# Patient Record
Sex: Female | Born: 1970 | Race: White | Hispanic: No | Smoking: Never smoker
Health system: Southern US, Community
[De-identification: ages and names within clinical notes are randomized; demographics above are authoritative.]

## PROBLEM LIST (undated history)

## (undated) DIAGNOSIS — I10 Essential (primary) hypertension: Secondary | ICD-10-CM

## (undated) DIAGNOSIS — I509 Heart failure, unspecified: Secondary | ICD-10-CM

## (undated) DIAGNOSIS — E119 Type 2 diabetes mellitus without complications: Secondary | ICD-10-CM

## (undated) DIAGNOSIS — F32A Depression, unspecified: Secondary | ICD-10-CM

## (undated) DIAGNOSIS — R600 Localized edema: Secondary | ICD-10-CM

## (undated) HISTORY — DX: Essential (primary) hypertension: I10

## (undated) HISTORY — DX: Depression, unspecified: F32.A

## (undated) HISTORY — DX: Localized edema: R60.0

---

## 2005-11-18 ENCOUNTER — Ambulatory Visit: Payer: Self-pay | Admitting: Family Medicine

## 2007-04-17 ENCOUNTER — Emergency Department: Payer: Self-pay | Admitting: Emergency Medicine

## 2008-06-19 ENCOUNTER — Emergency Department: Payer: Self-pay | Admitting: Emergency Medicine

## 2009-05-13 ENCOUNTER — Ambulatory Visit: Payer: Self-pay

## 2010-05-18 ENCOUNTER — Ambulatory Visit: Payer: Self-pay

## 2011-03-10 IMAGING — US ULTRASOUND RIGHT BREAST
1 series · 17 of 17 positions shown · non-contrast
Comparison: none

REASON FOR EXAM: 2  1 cm nodules at [DATE] right breast approximately 7 cm
from the areola
COMMENTS:

[Series 1: ultrasound right breast · 17 of 17 slices shown]
[im 1/17]
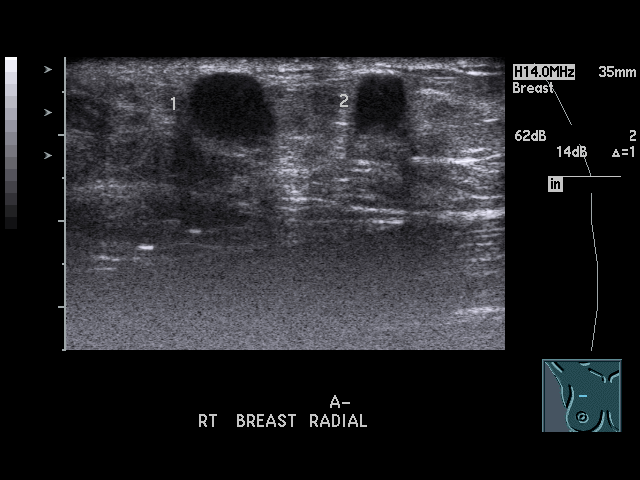
[im 2/17]
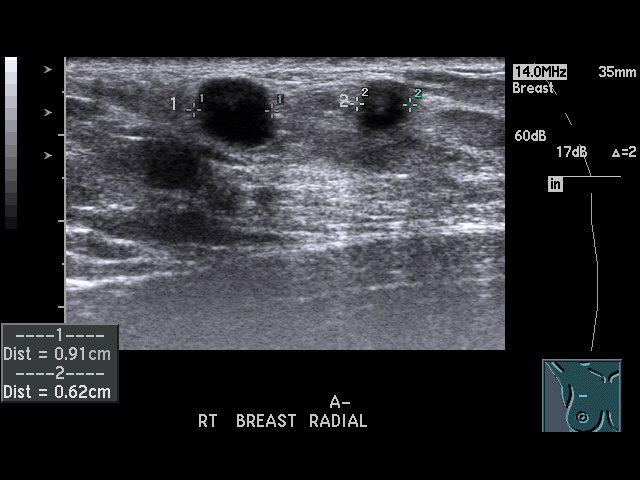
[im 3/17]
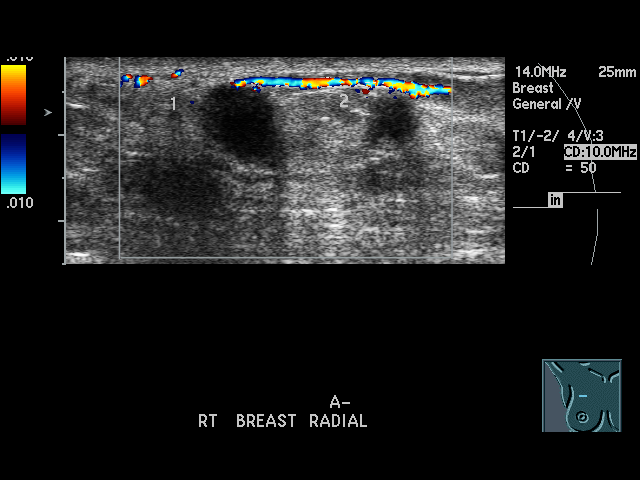
[im 4/17]
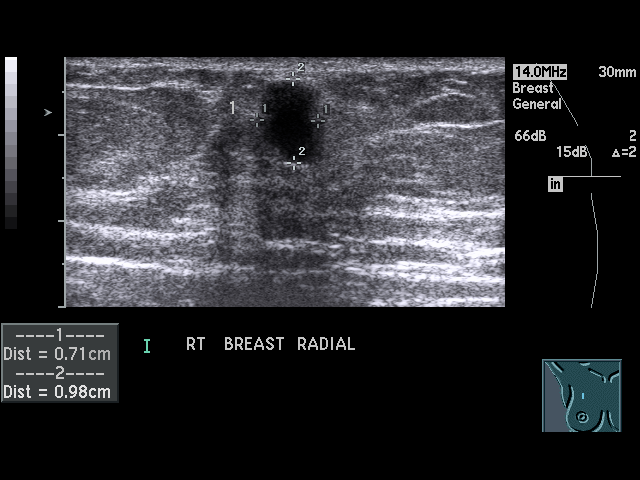
[im 5/17]
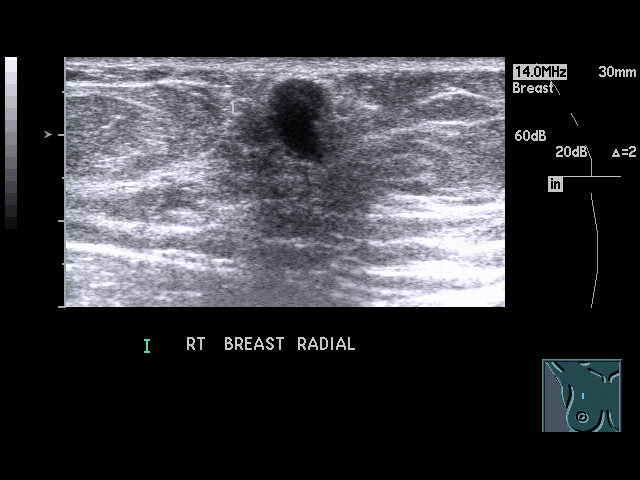
[im 6/17]
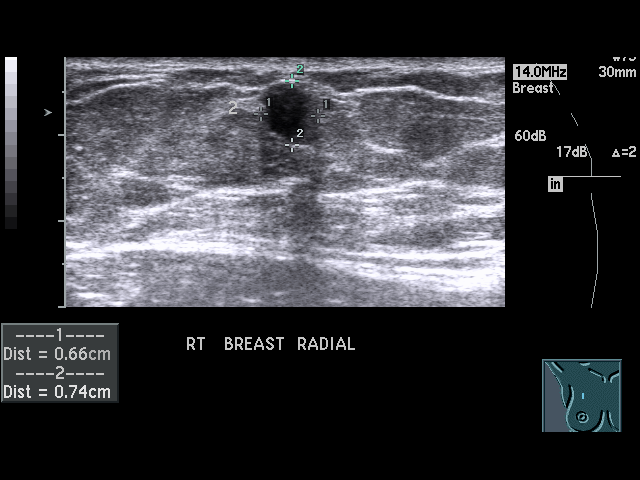
[im 7/17]
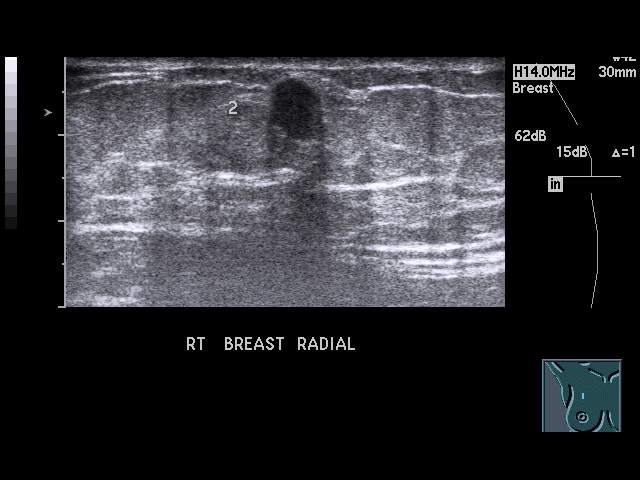
[im 8/17]
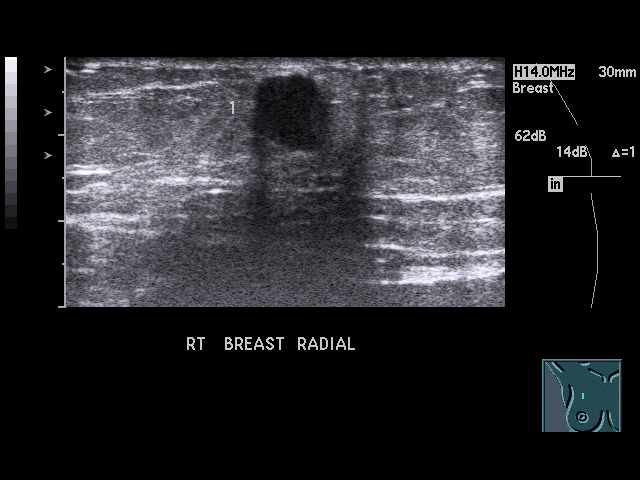
[im 9/17]
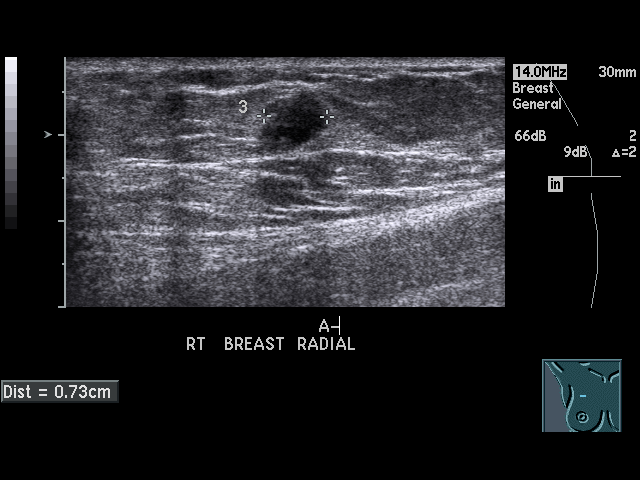
[im 10/17]
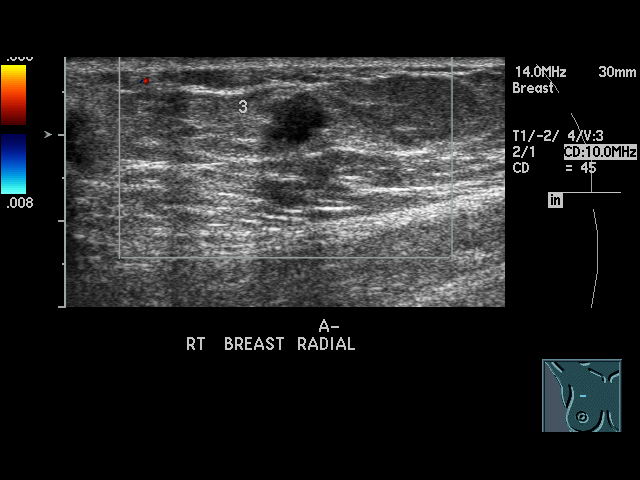
[im 11/17]
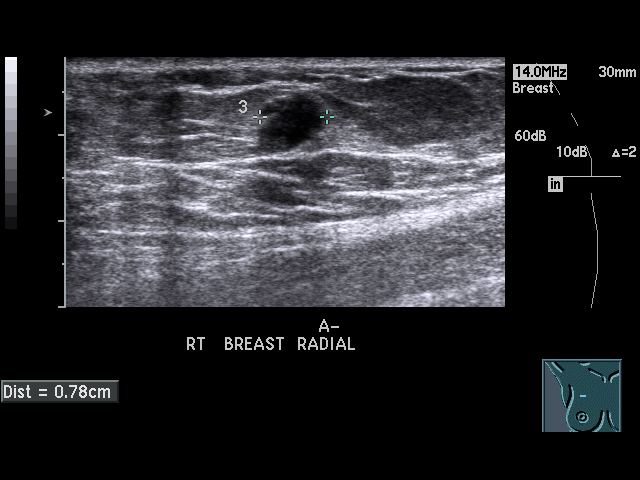
[im 12/17]
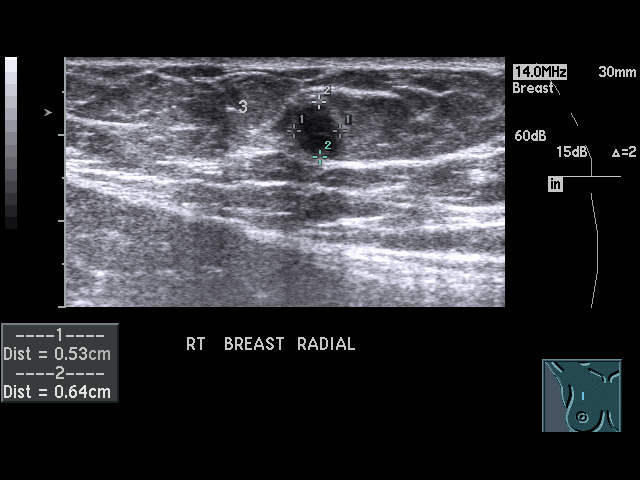
[im 13/17]
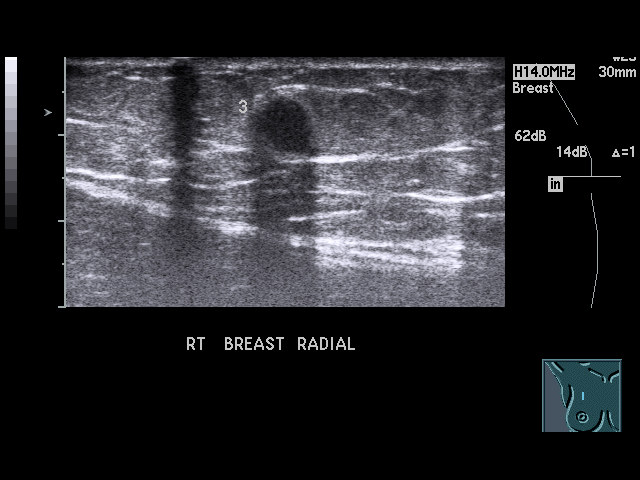
[im 14/17]
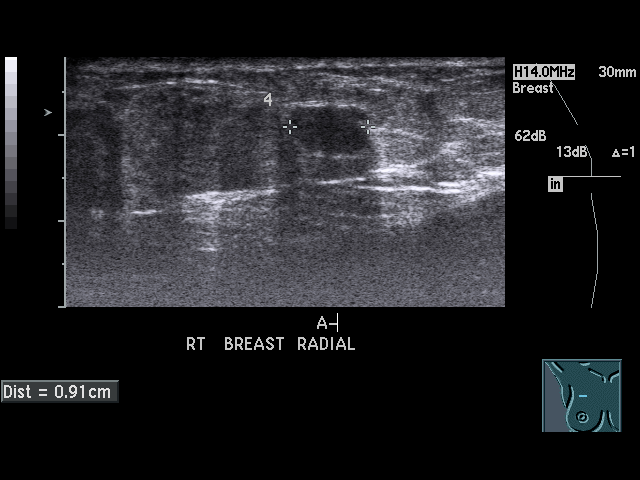
[im 15/17]
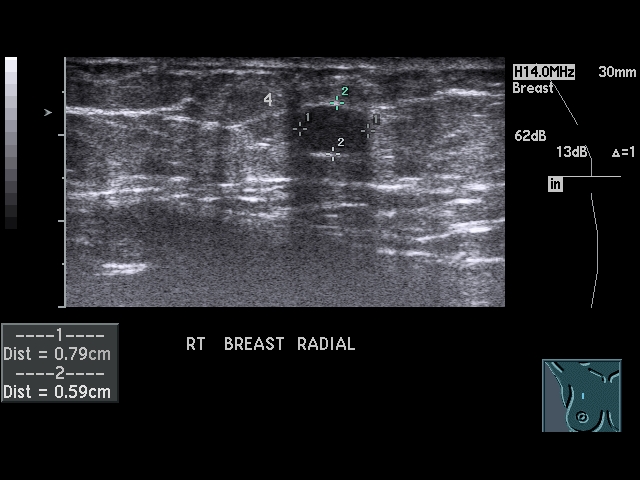
[im 16/17]
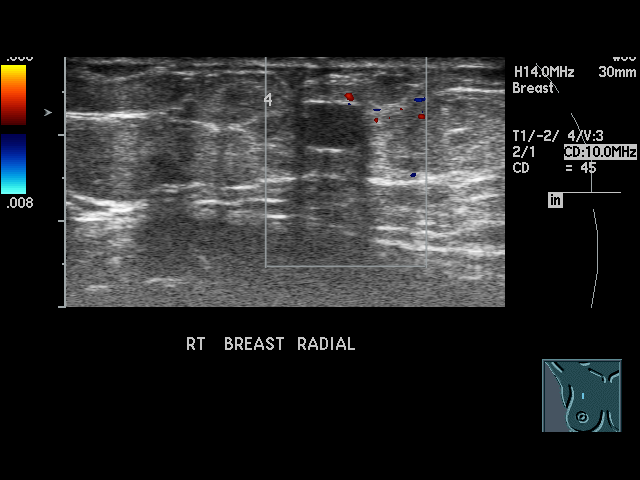
[im 17/17]
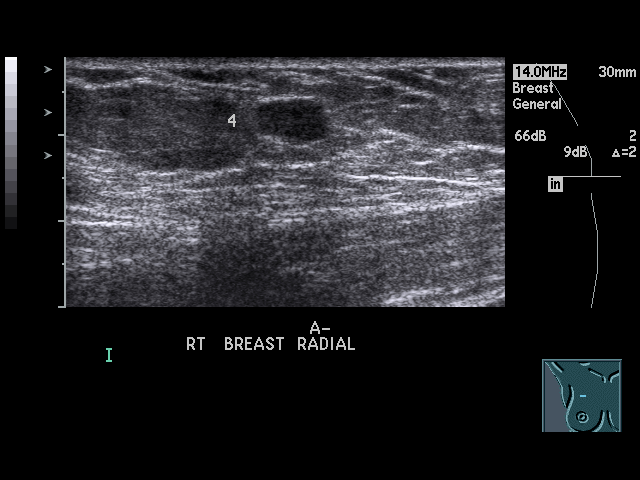

[17 of 17 positions shown; findings below may reference images not displayed]

PROCEDURE:     US  - US RT BREAST ([REDACTED])  - May 13, 2009  [DATE]

RESULT:       Targeted ultrasound of the right breast in the 12 o'clock
region demonstrates four similar nodular hypoechoic shadowing structures.
These measure similar in size with the largest measuring up to approximately
10 mm and the smallest diameter measuring approximately 5 mm.  Surgical
consultation is recommended.  Solid nodules may be present. Lipid or oil
cysts are not completely excluded.
IMPRESSION: BI-RADS:  Category 4- Suspicious Abnormality.

## 2011-03-10 IMAGING — MG MAM BCCCP DIG DIAGNOSTIC W/CAD
1 series · 7 of 7 positions shown · non-contrast
Comparison: none

REASON FOR EXAM: 2 1 cm nodules [DATE] right breast 7 cm from the areola
COMMENTS:

[Series 5031: R CC · right · 7 of 7 slices shown]
[im 1/7]
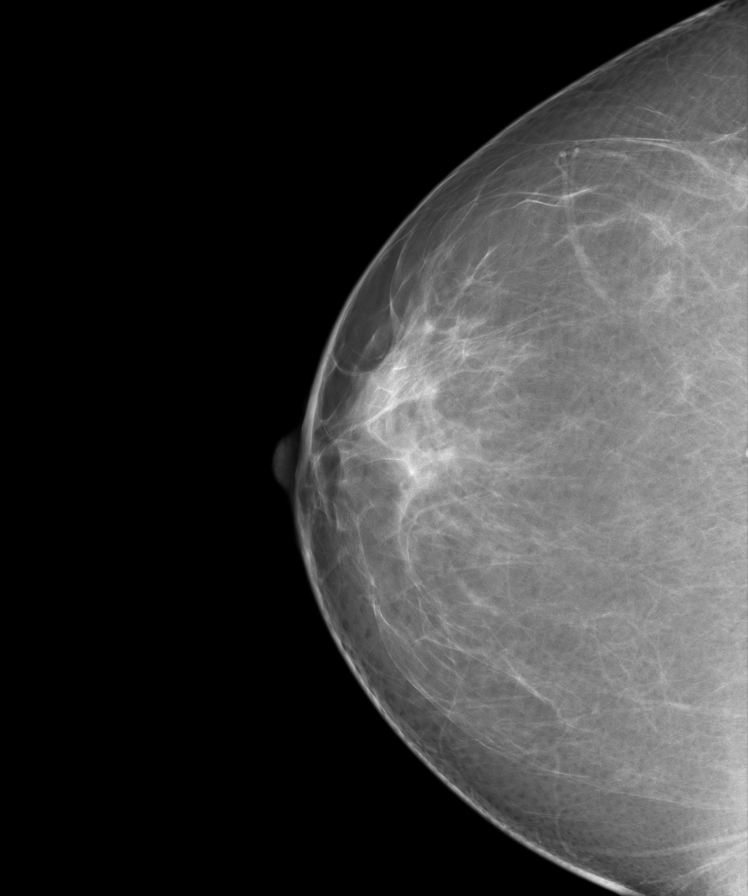
[im 2/7]
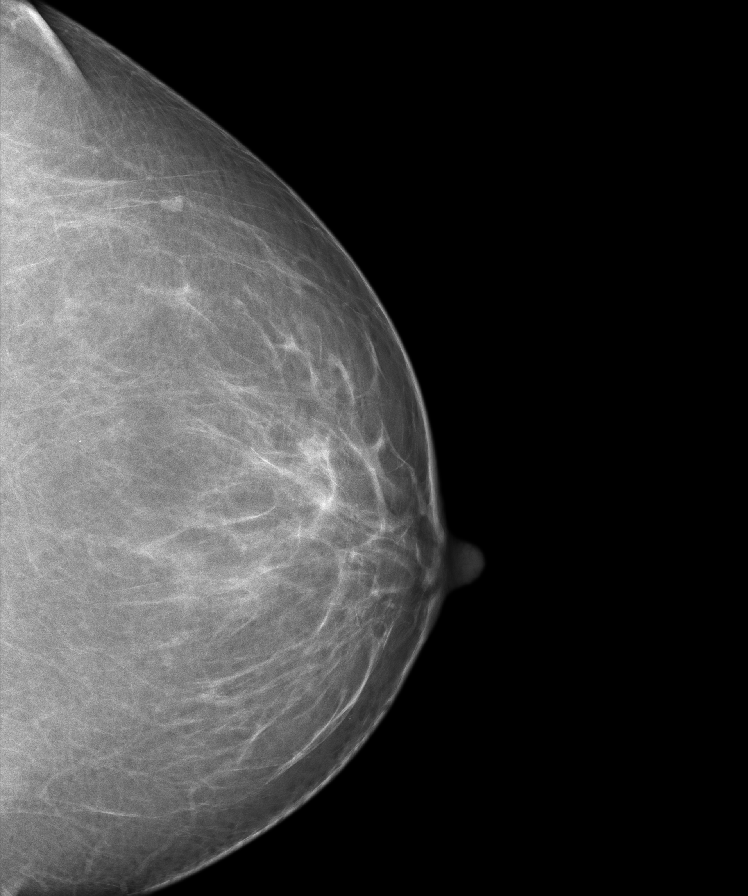
[im 3/7]
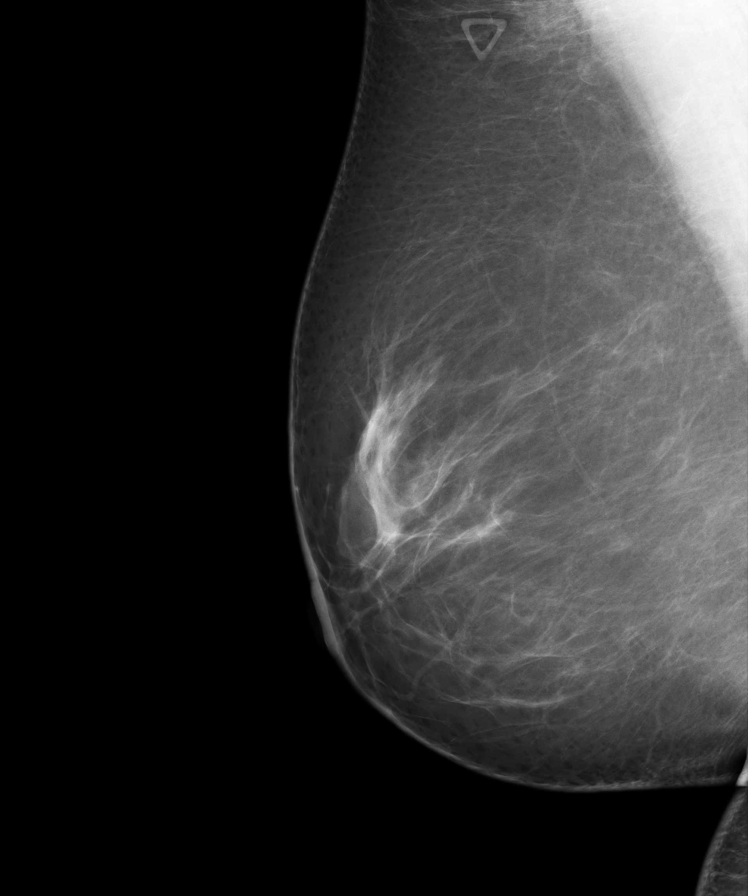
[im 4/7]
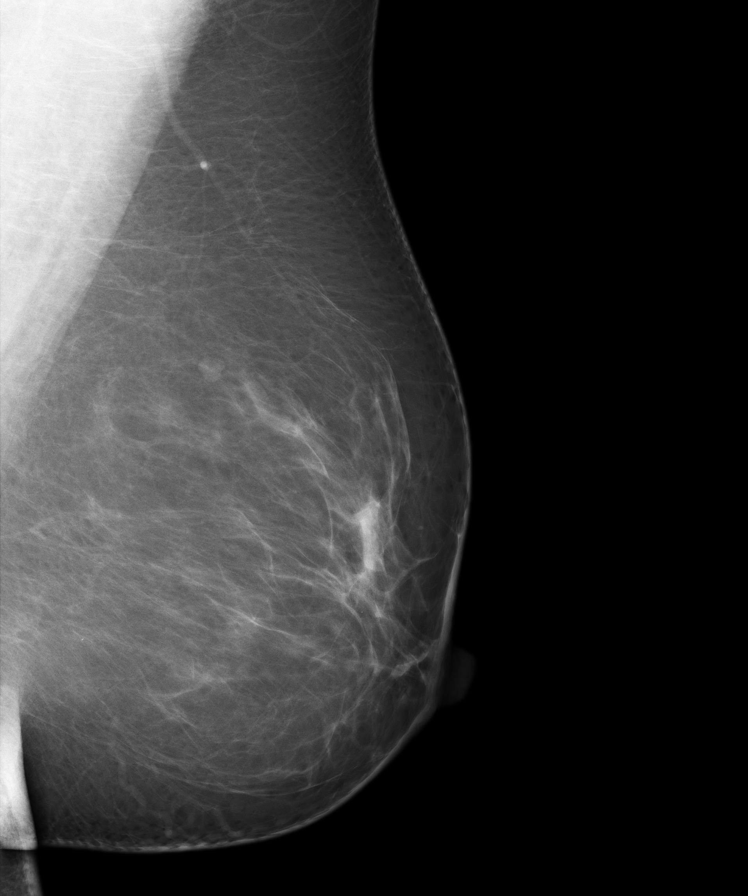
[im 5/7]
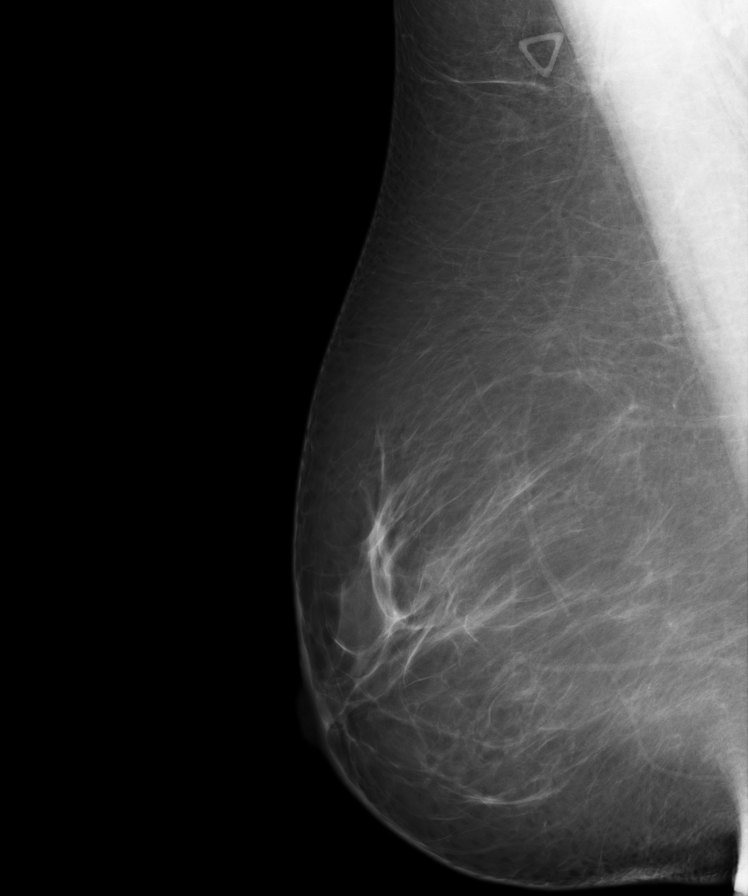
[im 6/7]
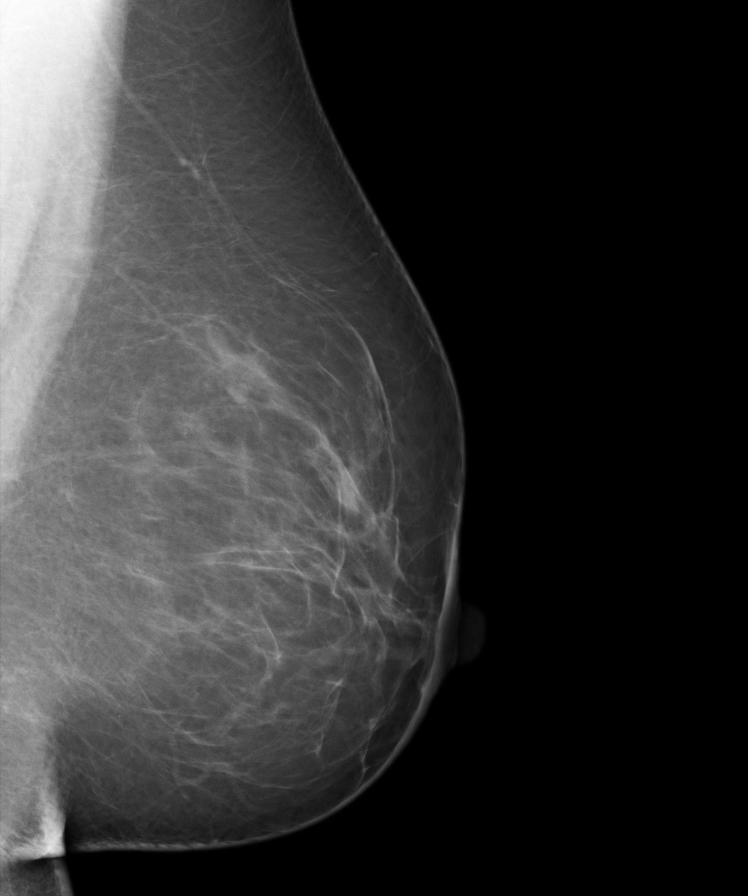
[im 7/7]
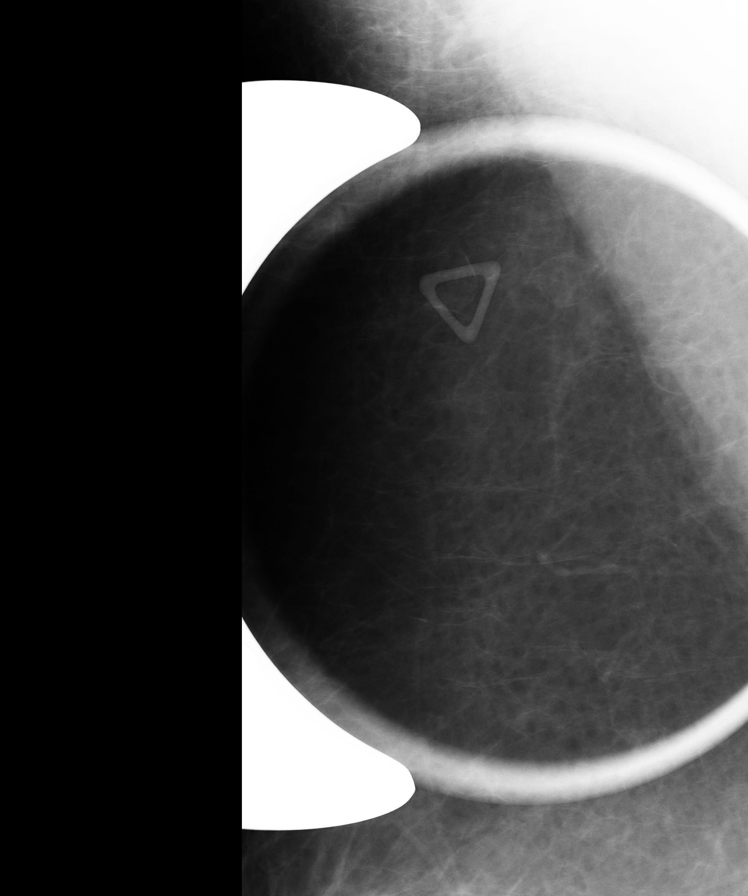

[7 of 7 positions shown; findings below may reference images not displayed]

PROCEDURE:     MAM - MAM [REDACTED] DIG DIAGNOSTIC W/CAD  - May 13, 2009  [DATE]

RESULT:       A triangular metallic marker is placed over the upper central
region of the right breast. This is not visible on the CC projections and
only seen on the MLO and ML views at the margin.  A targeted exaggerated CCL
magnified compression view over the area is obtained.  Some hazy increased
density is seen in this region.  Discrete nodules are difficult to target.
No malignant calcifications are evident.  The sonographic appearance shows
four discrete lesions as described. Oil cysts or lipid cysts could be
considered. Adenomas are felt to be less likely given the lack of
significant mammographic nodular density.  Surgical consultation is
recommended for follow up. Otherwise, the breasts exhibit a mildly dense
parenchymal pattern with a smoothly marginated small nodular density
laterally in the left breast superiorly in the posterior third which could
be an intramammary lymph node or benign lesion.
IMPRESSION: 1.     BI-RADS:  Category 4- Suspicious Abnormality.
2.     Surgical follow up for the upper central posterior right breast.

A negative mammogram report does not preclude biopsy or other evaluation of
a clinically palpable or otherwise suspicious mass or lesion. Breast cancer
may not be detected by mammography in up to 10% of cases.

## 2011-07-01 ENCOUNTER — Emergency Department: Payer: Self-pay | Admitting: Emergency Medicine

## 2011-07-01 LAB — PREGNANCY, URINE: Pregnancy Test, Urine: NEGATIVE m[IU]/mL

## 2012-07-25 ENCOUNTER — Ambulatory Visit: Payer: Self-pay

## 2012-07-26 ENCOUNTER — Ambulatory Visit: Payer: Self-pay

## 2012-09-12 ENCOUNTER — Emergency Department: Payer: Self-pay | Admitting: Emergency Medicine

## 2016-06-27 ENCOUNTER — Encounter: Payer: Self-pay | Admitting: *Deleted

## 2016-06-27 ENCOUNTER — Emergency Department
Admission: EM | Admit: 2016-06-27 | Discharge: 2016-06-27 | Disposition: A | Discharge status: Home / Self Care | Payer: BLUE CROSS/BLUE SHIELD | Attending: Student in an Organized Health Care Education/Training Program | Admitting: Student in an Organized Health Care Education/Training Program

## 2016-06-27 DIAGNOSIS — E119 Type 2 diabetes mellitus without complications: Secondary | ICD-10-CM | POA: Diagnosis not present

## 2016-06-27 DIAGNOSIS — R05 Cough: Secondary | ICD-10-CM | POA: Diagnosis present

## 2016-06-27 DIAGNOSIS — J069 Acute upper respiratory infection, unspecified: Secondary | ICD-10-CM

## 2016-06-27 DIAGNOSIS — L304 Erythema intertrigo: Secondary | ICD-10-CM

## 2016-06-27 HISTORY — DX: Type 2 diabetes mellitus without complications: E11.9

## 2016-06-27 MED ORDER — AZITHROMYCIN 250 MG PO TABS: ORAL_TABLET | ORAL | 0 refills | Status: DC

## 2016-06-27 MED ORDER — PREDNISONE 10 MG (21) PO TBPK: ORAL_TABLET | ORAL | 0 refills | Status: DC

## 2016-06-27 MED ORDER — NYSTATIN-TRIAMCINOLONE 100000-0.1 UNIT/GM-% EX OINT: 1.0000 "application " | TOPICAL_OINTMENT | Freq: Two times a day (BID) | CUTANEOUS | 0 refills | Status: DC

## 2016-06-27 NOTE — ED Triage Notes (Signed)
States cough and nasal congestion for 1 month, also states redness around her belly button

## 2016-06-27 NOTE — ED Provider Notes (Signed)
[facility] Emergency Department Provider Note  ____________________________________________  Time seen: Approximately 3:51 PM  I have reviewed the triage vital signs and the nursing notes.   HISTORY  Chief Complaint Cough and Nasal Congestion    HPI Kristy Cunningham is a 46 y.o. female resents to the emergency department with congestion and cough for 1 month. Patient states sometimes she coughs so hard she vomits. She has difficulty coughing up the sputum. Patient does not smoke. She also states that she has a rash in her bellybutton. She states that her provider keeps telling her that it is yeast but she does not understand how she gets yeast in her bellybutton. She states that she keeps it clean and dry. She also missed her period last month and thinks she could be going through menopause. She denies fever, shortness of breath, chest pain, nausea, vomiting, diarrhea, constipation.   Past Medical History:  Diagnosis Date  . Diabetes mellitus without complication (HCC)     There are no active problems to display for this patient.   History reviewed. No pertinent surgical history.  Prior to Admission medications   Medication Sig Start Date End Date Taking? Authorizing Provider  azithromycin (ZITHROMAX Z-PAK) 250 MG tablet Take 2 tablets (500 mg) on  Day 1,  followed by 1 tablet (250 mg) once daily on Days 2 through 5. 06/27/16   Enid Derry, PA-C  nystatin-triamcinolone ointment (MYCOLOG) Apply 1 application topically 2 (two) times daily. 06/27/16   Enid Derry, PA-C  predniSONE (STERAPRED UNI-PAK 21 TAB) 10 MG (21) TBPK tablet Take 6 tablets on day 1, take 5 tablets on day 2, take 4 tablets on day 3, take 3 tablets on day 4, take 2 tablets on day 5, take 1 tablet on day 6 06/27/16   Enid Derry, PA-C    Allergies Patient has no known allergies.  History reviewed. No pertinent family history.  Social History Social History  Substance Use Topics  .  Smoking status: Never Smoker  . Smokeless tobacco: Never Used  . Alcohol use No     Review of Systems  Constitutional: No fever/chills Eyes: No visual changes. No discharge. ENT: Positive for congestion and rhinorrhea. Cardiovascular: No chest pain. Respiratory: Positive for cough. No SOB. Gastrointestinal: No abdominal pain.  No nausea, no vomiting.  No diarrhea.  No constipation. Musculoskeletal: Negative for musculoskeletal pain. Skin: Negative for  abrasions, lacerations, ecchymosis. Positive for bellybutton rash. Neurological: Negative for headaches.   ____________________________________________   PHYSICAL EXAM:  VITAL SIGNS: ED Triage Vitals [06/27/16 1347]  Enc Vitals Group     BP 136/68     Pulse Rate 100     Resp 18     Temp 98.8 F (37.1 C)     Temp Source Oral     SpO2 95 %     Weight 186 lb (84.4 kg)     Height  (1.549 m)     Head Circumference      Peak Flow      Pain Score 0     Pain Loc      Pain Edu?      Excl. in GC?      Constitutional: Alert and oriented. Well appearing and in no acute distress. Eyes: Conjunctivae are normal. PERRL. EOMI. No discharge. Head: Atraumatic. ENT: No frontal and maxillary sinus tenderness.      Ears: Tympanic membranes pearly gray with good landmarks. No discharge.      Nose: Mild  congestion/rhinnorhea.      Mouth/Throat: Mucous membranes are moist. Oropharynx non-erythematous. Tonsils not enlarged. No exudates. Uvula midline. Neck: No stridor.   Hematological/Lymphatic/Immunilogical: No cervical lymphadenopathy. Cardiovascular: Normal rate, regular rhythm.  Good peripheral circulation. Respiratory: Normal respiratory effort without tachypnea or retractions. Lungs CTAB. Good air entry to the bases with no decreased or absent breath sounds. Gastrointestinal: Bowel sounds 4 quadrants. Soft and nontender to palpation. No guarding or rigidity. No palpable masses. No distention. Musculoskeletal: Full range of  motion to all extremities. No gross deformities appreciated. Neurologic:  Normal speech and language. No gross focal neurologic deficits are appreciated.  Skin:  Skin is warm, dry and intact. Right red patch of erythema inside of bellybutton.   ____________________________________________   LABS (all labs ordered are listed, but only abnormal results are displayed)  Labs Reviewed  POC URINE PREG, ED   ____________________________________________  EKG   ____________________________________________  RADIOLOGY   No results found.  ____________________________________________    PROCEDURES  Procedure(s) performed:    Procedures    Medications - No data to display   ____________________________________________   INITIAL IMPRESSION / ASSESSMENT AND PLAN / ED COURSE  Pertinent labs & imaging results that were available during my care of the patient were reviewed by me and considered in my medical decision making (see chart for details).  Review of the [place] CSRS was performed in accordance of the [facility] prior to dispensing any controlled drugs.     Patient's diagnosis is consistent with upper respiratory infection and intertrigo. Vital signs and exam are reassuring. Patient appears well and is staying well hydrated. Patient feels comfortable going home. Patient will be discharged home with prescriptions for azithromycin, prednisone, Mycolog. Patient is to follow up with PCP as needed or otherwise directed. Patient is given ED precautions to return to the ED for any worsening or new symptoms.     ____________________________________________  FINAL CLINICAL IMPRESSION(S) / ED DIAGNOSES  Final diagnoses:  Upper respiratory tract infection, unspecified type  Intertrigo      NEW MEDICATIONS STARTED DURING THIS VISIT:  Discharge Medication List as of 06/27/2016  3:19 PM    START taking these medications   Details  azithromycin (ZITHROMAX Z-PAK) 250 MG tablet  Take 2 tablets (500 mg) on  Day 1,  followed by 1 tablet (250 mg) once daily on Days 2 through 5., Print    nystatin-triamcinolone ointment (MYCOLOG) Apply 1 application topically 2 (two) times daily., Starting Mon 06/27/2016, Print    predniSONE (STERAPRED UNI-PAK 21 TAB) 10 MG (21) TBPK tablet Take 6 tablets on day 1, take 5 tablets on day 2, take 4 tablets on day 3, take 3 tablets on day 4, take 2 tablets on day 5, take 1 tablet on day 6, Print            This chart was dictated using voice recognition software/Dragon. Despite best efforts to proofread, errors can occur which can change the meaning. Any change was purely unintentional.    Enid Derry, PA-C 06/27/16 1556    Willy Eddy, MD 06/27/16 [phone]

## 2016-06-27 NOTE — ED Notes (Signed)
See triage note states she has had cough,nasal congestion for about 1 month  Now voice is becoming hoarse  No fever   Also having intermittent areas of red rash  Also has an area to abd ,around umbilicus that is red and irritated

## 2017-10-24 ENCOUNTER — Other Ambulatory Visit: Payer: Self-pay | Admitting: Internal Medicine

## 2017-10-24 ENCOUNTER — Ambulatory Visit
Admission: RE | Admit: 2017-10-24 | Discharge: 2017-10-24 | Disposition: A | Discharge status: Home / Self Care | Payer: Self-pay | Source: Ambulatory Visit | Attending: Internal Medicine | Admitting: Internal Medicine

## 2017-10-24 DIAGNOSIS — M79671 Pain in right foot: Secondary | ICD-10-CM

## 2018-09-10 ENCOUNTER — Ambulatory Visit: Payer: Self-pay

## 2019-07-16 ENCOUNTER — Ambulatory Visit: Payer: Self-pay | Attending: Oncology

## 2019-07-16 ENCOUNTER — Ambulatory Visit
Admission: RE | Admit: 2019-07-16 | Discharge: 2019-07-16 | Disposition: A | Discharge status: Home / Self Care | Payer: Self-pay | Source: Ambulatory Visit | Attending: Oncology | Admitting: Oncology

## 2019-07-16 ENCOUNTER — Other Ambulatory Visit: Payer: Self-pay

## 2019-07-16 VITALS — BP 138/81 | HR 74 | Temp 97.2°F | Ht 60.0 in | Wt 187.5 lb

## 2019-07-16 DIAGNOSIS — Z0000 Encounter for general adult medical examination without abnormal findings: Secondary | ICD-10-CM

## 2019-07-16 NOTE — Progress Notes (Signed)
  Subjective:     Patient ID: Kristy Cunningham, female   DOB: [birth year], 49 y.o.   MRN: [id number]  HPI   Review of Systems     Objective:   Physical Exam Chest:     Breasts:        Right: No swelling, bleeding, inverted nipple, mass, nipple discharge, skin change or tenderness.        Left: No swelling, bleeding, inverted nipple, mass, nipple discharge, skin change or tenderness.        Assessment:     49 year old patient presents for [facility] clinic visit.  Patient screened, and meets BCCCP eligibility.  Patient does not have insurance, Medicare or Medicaid. Instructed patient on breast self awareness using teach back method.  Clinical breast exam unremarkable. No mass or lump palpated.  Risk Assessment    Risk Scores      07/16/2019   Last edited by: Jim Like, RN   5-year risk: 0.9 %   Lifetime risk: 8 %            Plan:     Sent for bilateral screening mammogram.

## 2019-07-17 NOTE — Progress Notes (Signed)
Letter mailed from [facility] to notify of normal mammogram results.  Patient to return in one year for annual screening.  Copy to [facility]. 

## 2019-08-21 IMAGING — CR DG FOOT COMPLETE 3+V*R*
3 series · 3 of 3 positions shown · non-contrast
Comparison: RIGHT fifth toe radiograph 09/12/2012

CLINICAL DATA: Stubbed foot between second and third toes [REDACTED], increased pain since [REDACTED] night, redness, bruising

EXAM:
RIGHT FOOT COMPLETE - 3+ VIEW

[foot ap]
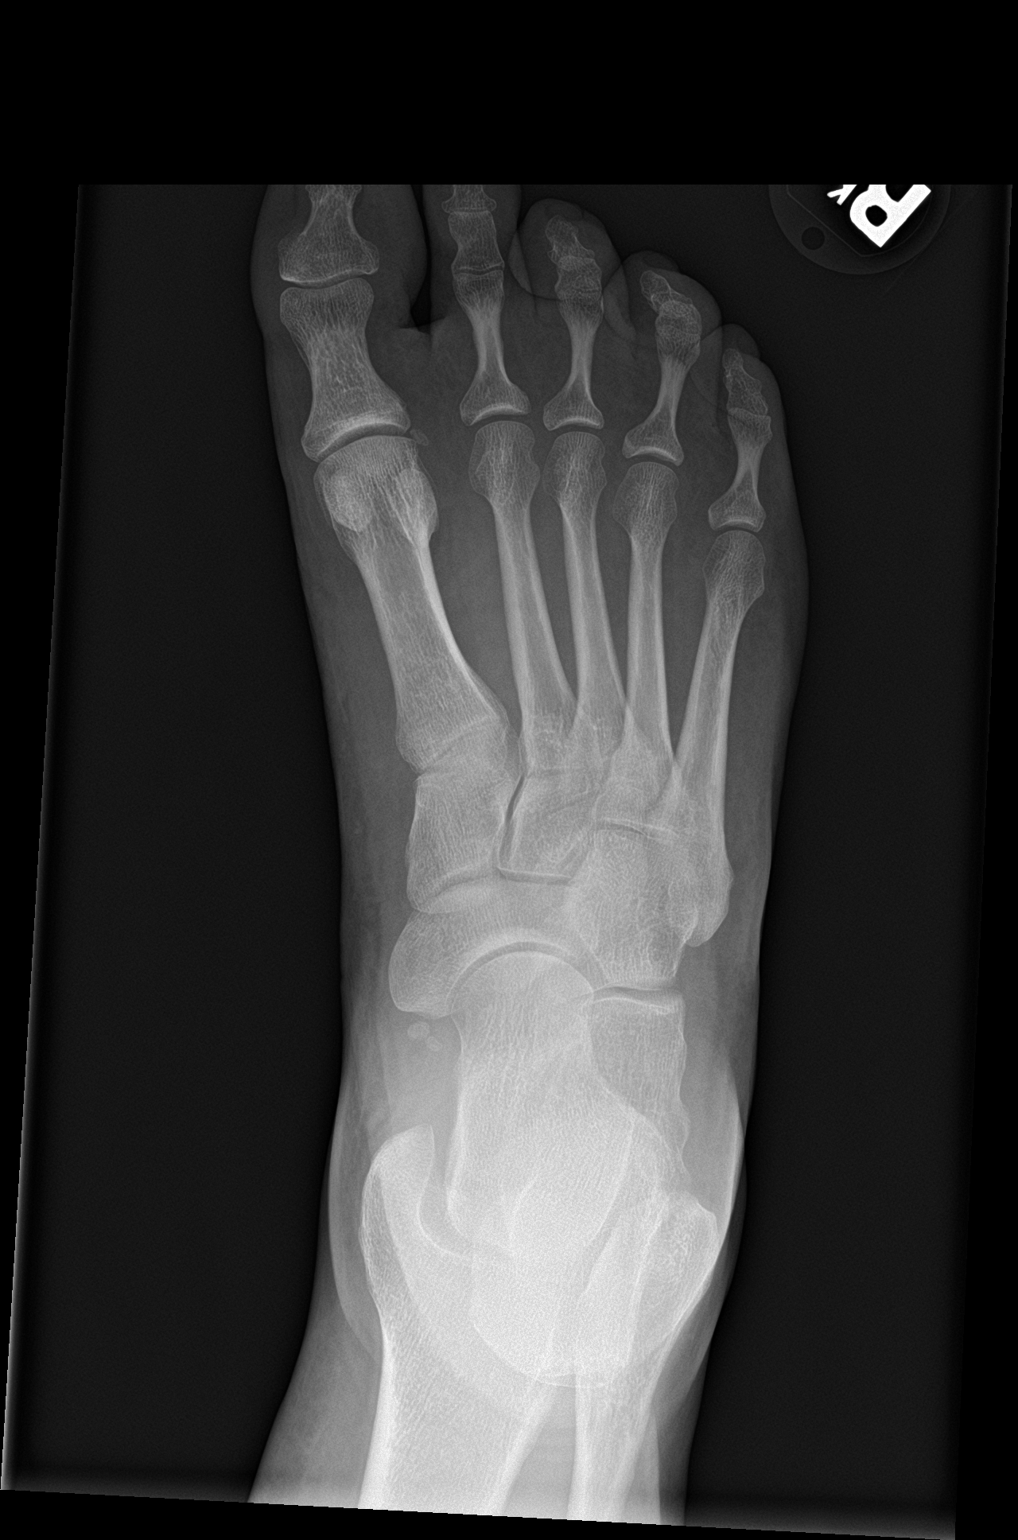

[foot obl]
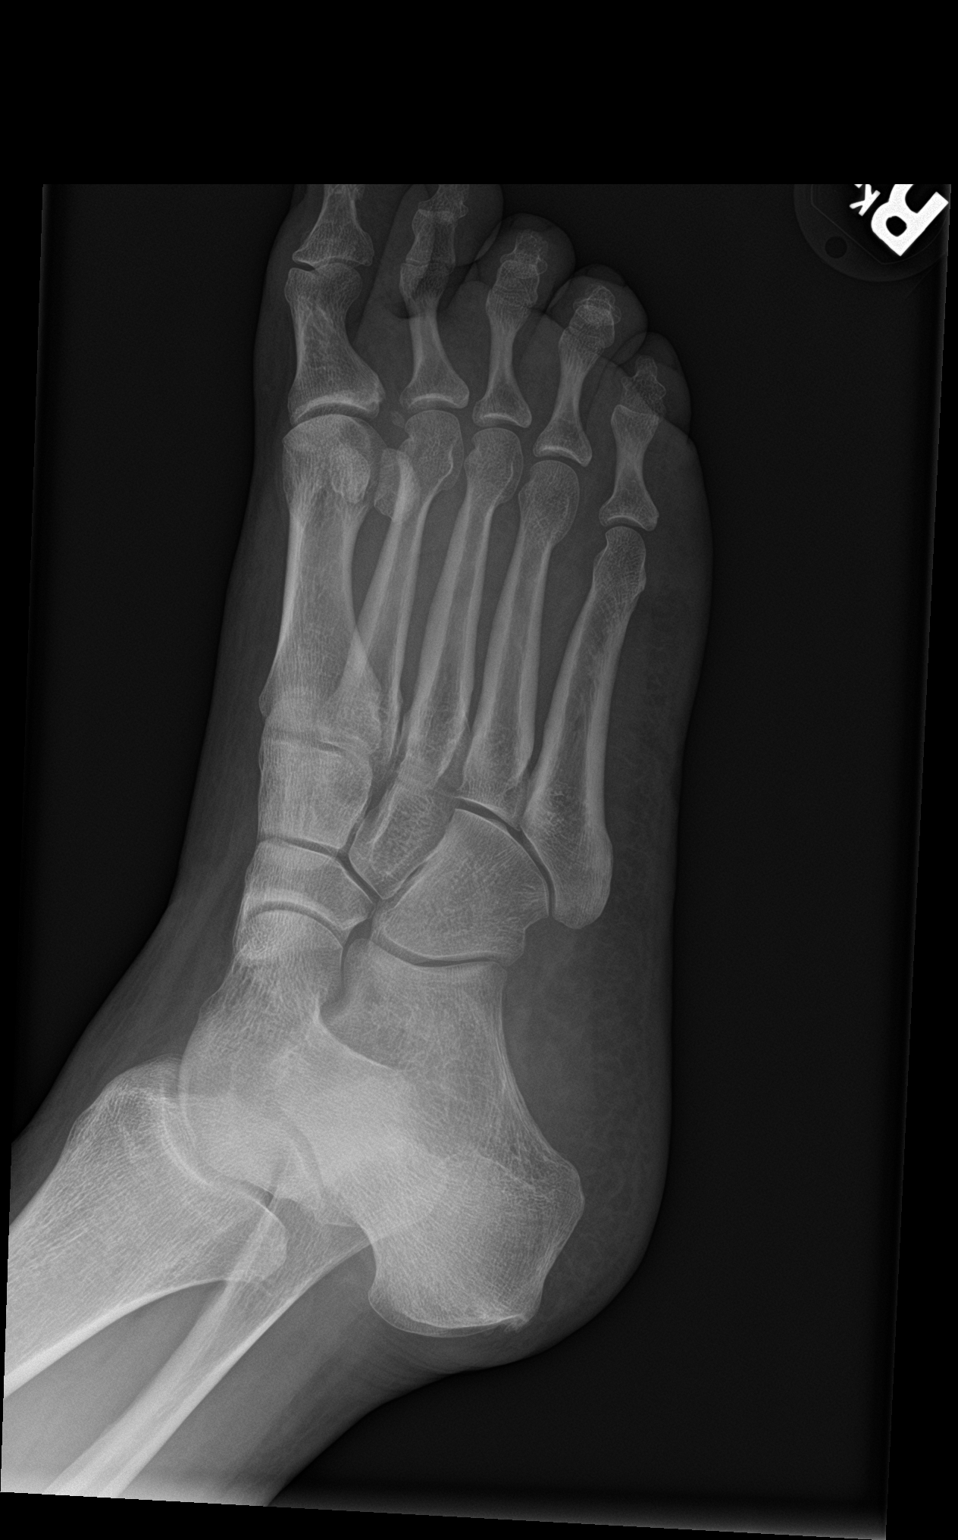

[foot lat]
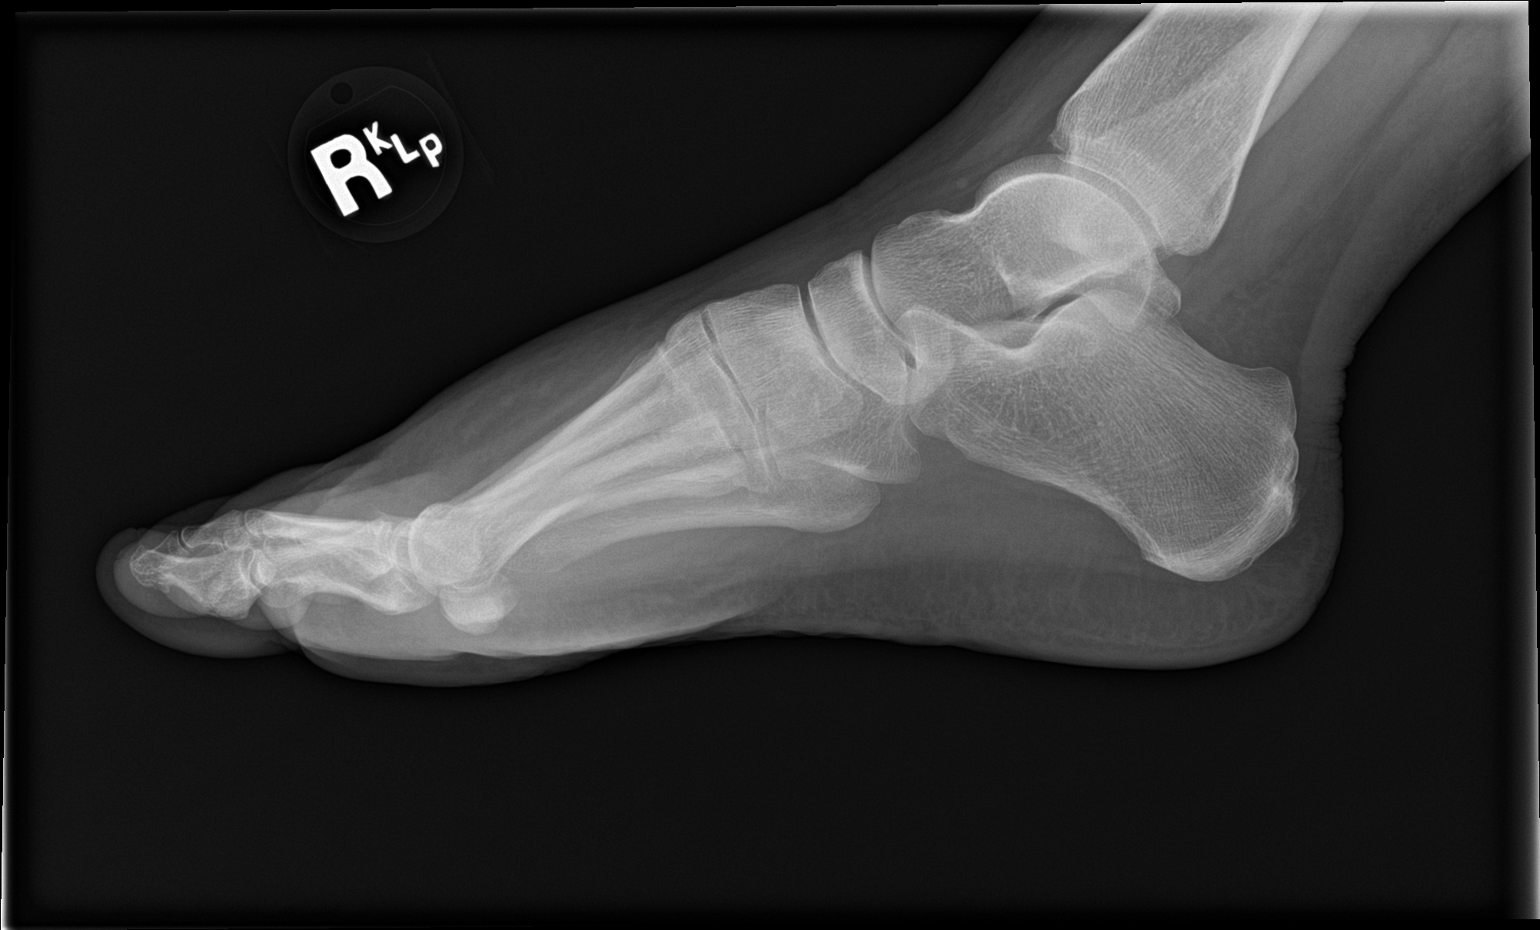

[3 of 3 positions shown; findings below may reference images not displayed]

FINDINGS: Osseous mineralization normal.

Joint spaces preserved.

No acute fracture, dislocation, or bone destruction.

Soft tissue swelling at dorsum of foot overlying the distal
metatarsals.
IMPRESSION: No acute osseous abnormalities.

## 2021-02-27 ENCOUNTER — Other Ambulatory Visit: Payer: Self-pay

## 2021-02-27 ENCOUNTER — Emergency Department: Payer: Medicaid Other

## 2021-02-27 ENCOUNTER — Emergency Department
Admission: EM | Admit: 2021-02-27 | Discharge: 2021-02-27 | Disposition: A | Discharge status: Home / Self Care | Payer: Medicaid Other | Attending: Emergency Medicine | Admitting: Emergency Medicine

## 2021-02-27 DIAGNOSIS — E119 Type 2 diabetes mellitus without complications: Secondary | ICD-10-CM | POA: Diagnosis not present

## 2021-02-27 DIAGNOSIS — Z20822 Contact with and (suspected) exposure to covid-19: Secondary | ICD-10-CM | POA: Diagnosis not present

## 2021-02-27 DIAGNOSIS — J101 Influenza due to other identified influenza virus with other respiratory manifestations: Secondary | ICD-10-CM | POA: Diagnosis not present

## 2021-02-27 DIAGNOSIS — R059 Cough, unspecified: Secondary | ICD-10-CM | POA: Diagnosis present

## 2021-02-27 LAB — CBC WITH DIFFERENTIAL/PLATELET
Abs Immature Granulocytes: 0.01 10*3/uL (ref 0.00–0.07)
Basophils Absolute: 0 10*3/uL (ref 0.0–0.1)
Basophils Relative: 0 %
Eosinophils Absolute: 0.1 10*3/uL (ref 0.0–0.5)
Eosinophils Relative: 1 %
HCT: 40.6 % (ref 36.0–46.0)
Hemoglobin: 13.3 g/dL (ref 12.0–15.0)
Immature Granulocytes: 0 %
Lymphocytes Relative: 7 %
Lymphs Abs: 0.4 10*3/uL — ABNORMAL LOW (ref 0.7–4.0)
MCH: 29.8 pg (ref 26.0–34.0)
MCHC: 32.8 g/dL (ref 30.0–36.0)
MCV: 90.8 fL (ref 80.0–100.0)
Monocytes Absolute: 0.4 10*3/uL (ref 0.1–1.0)
Monocytes Relative: 7 %
Neutro Abs: 4.4 10*3/uL (ref 1.7–7.7)
Neutrophils Relative %: 85 %
Platelets: 214 10*3/uL (ref 150–400)
RBC: 4.47 MIL/uL (ref 3.87–5.11)
RDW: 13.2 % (ref 11.5–15.5)
WBC: 5.2 10*3/uL (ref 4.0–10.5)
nRBC: 0 % (ref 0.0–0.2)

## 2021-02-27 LAB — RESP PANEL BY RT-PCR (FLU A&B, COVID) ARPGX2
Influenza A by PCR: POSITIVE — AB
Influenza B by PCR: NEGATIVE
SARS Coronavirus 2 by RT PCR: NEGATIVE

## 2021-02-27 LAB — URINALYSIS, ROUTINE W REFLEX MICROSCOPIC
Bilirubin Urine: NEGATIVE
Glucose, UA: >1000 mg/dL — AB
Leukocytes,Ua: NEGATIVE
Nitrite: NEGATIVE
Protein, ur: 100 mg/dL — AB
Specific Gravity, Urine: 1.02 (ref 1.005–1.030)
pH: 5.5 (ref 5.0–8.0)

## 2021-02-27 LAB — COMPREHENSIVE METABOLIC PANEL
ALT: 71 U/L — ABNORMAL HIGH (ref 0–44)
AST: 112 U/L — ABNORMAL HIGH (ref 15–41)
Albumin: 4 g/dL (ref 3.5–5.0)
Alkaline Phosphatase: 84 U/L (ref 38–126)
Anion gap: 9 (ref 5–15)
BUN: 14 mg/dL (ref 6–20)
CO2: 25 mmol/L (ref 22–32)
Calcium: 9.2 mg/dL (ref 8.9–10.3)
Chloride: 96 mmol/L — ABNORMAL LOW (ref 98–111)
Creatinine, Ser: 0.59 mg/dL (ref 0.44–1.00)
GFR, Estimated: >60 mL/min (ref >60)
Glucose, Bld: 392 mg/dL — ABNORMAL HIGH (ref 70–99)
Potassium: 4.1 mmol/L (ref 3.5–5.1)
Sodium: 130 mmol/L — ABNORMAL LOW (ref 135–145)
Total Bilirubin: 1 mg/dL (ref 0.3–1.2)
Total Protein: 8.2 g/dL — ABNORMAL HIGH (ref 6.5–8.1)

## 2021-02-27 LAB — LACTIC ACID, PLASMA: Lactic Acid, Venous: 1.8 mmol/L (ref 0.5–1.9)

## 2021-02-27 LAB — CBG MONITORING, ED: Glucose-Capillary: 333 mg/dL — ABNORMAL HIGH (ref 70–99)

## 2021-02-27 MED ORDER — ACETAMINOPHEN 325 MG PO TABS
650.0000 mg | ORAL_TABLET | Freq: Once | ORAL | Status: AC
  Administered 2021-02-27: 650 mg via ORAL
  Filled 2021-02-27: qty 2

## 2021-02-27 MED ORDER — SODIUM CHLORIDE 0.9 % IV BOLUS
1000.0000 mL | Freq: Once | INTRAVENOUS | Status: AC
  Administered 2021-02-27: 1000 mL via INTRAVENOUS

## 2021-02-27 NOTE — ED Provider Notes (Signed)
[facility]-EMERGENCY DEPARTMENT  ____________________________________________  Time seen: Approximately 4:50 PM  I have reviewed the triage vital signs and the nursing notes.   HISTORY  Chief Complaint covid sx   Historian Patient     HPI Kristy Cunningham is a 50 y.o. female with a history of diabetes, presents to the emergency department with flulike symptoms for the past 6 to 7 days.  Patient states that she has had cough, headache and body aches.  She denies chest pain, chest tightness or shortness of breath.  Reports that she has been compliant with her diabetes medications.   Past Medical History:  Diagnosis Date   Diabetes mellitus without complication (HCC)      Immunizations up to date:  Yes.     Past Medical History:  Diagnosis Date   Diabetes mellitus without complication (HCC)     There are no problems to display for this patient.   No past surgical history on file.  Prior to Admission medications   Medication Sig Start Date End Date Taking? Authorizing Provider  azithromycin (ZITHROMAX Z-PAK) 250 MG tablet Take 2 tablets (500 mg) on  Day 1,  followed by 1 tablet (250 mg) once daily on Days 2 through 5. 06/27/16   Enid Derry, PA-C  nystatin-triamcinolone ointment (MYCOLOG) Apply 1 application topically 2 (two) times daily. 06/27/16   Enid Derry, PA-C  predniSONE (STERAPRED UNI-PAK 21 TAB) 10 MG (21) TBPK tablet Take 6 tablets on day 1, take 5 tablets on day 2, take 4 tablets on day 3, take 3 tablets on day 4, take 2 tablets on day 5, take 1 tablet on day 6 06/27/16   Enid Derry, PA-C    Allergies Patient has no known allergies.  Family History  Problem Relation Age of Onset   Breast cancer Paternal Grandmother     Social History Social History   Tobacco Use   Smoking status: Never   Smokeless tobacco: Never  Substance Use Topics   Alcohol use: No     Review of Systems  Constitutional: Patient has fever.  Eyes: No visual changes. No  discharge ENT: Patient has congestion.  Cardiovascular: no chest pain. Respiratory: Patient has cough.  Gastrointestinal: No abdominal pain.  No nausea, no vomiting. Patient had diarrhea.  Genitourinary: Negative for dysuria. No hematuria Musculoskeletal: Patient has myalgias.  Skin: Negative for rash, abrasions, lacerations, ecchymosis. Neurological: Patient has headache, no focal weakness or numbness.    ____________________________________________   PHYSICAL EXAM:  VITAL SIGNS: ED Triage Vitals [02/27/21 1607]  Enc Vitals Group     BP (!) 142/72     Pulse Rate (!) 129     Resp 20     Temp 99.8 F (37.7 C)     Temp Source Oral     SpO2 92 %     Weight 170 lb (77.1 kg)     Height 5' (1.524 m)     Head Circumference      Peak Flow      Pain Score 6     Pain Loc      Pain Edu?      Excl. in GC?      Constitutional: Alert and oriented. Patient is lying supine. Eyes: Conjunctivae are normal. PERRL. EOMI. Head: Atraumatic. ENT:      Ears: Tympanic membranes are mildly injected with mild effusion bilaterally.       Nose: No congestion/rhinnorhea.      Mouth/Throat: Mucous membranes are moist. Posterior pharynx is mildly  erythematous.  Hematological/Lymphatic/Immunilogical: No cervical lymphadenopathy.  Cardiovascular: Normal rate, regular rhythm. Normal S1 and S2.  Good peripheral circulation. Respiratory: Normal respiratory effort without tachypnea or retractions. Lungs CTAB. Good air entry to the bases with no decreased or absent breath sounds. Gastrointestinal: Bowel sounds 4 quadrants. Soft and nontender to palpation. No guarding or rigidity. No palpable masses. No distention. No CVA tenderness. Musculoskeletal: Full range of motion to all extremities. No gross deformities appreciated. Neurologic:  Normal speech and language. No gross focal neurologic deficits are appreciated.  Skin:  Skin is warm, dry and intact. No rash noted. Psychiatric: Mood and affect are  normal. Speech and behavior are normal. Patient exhibits appropriate insight and judgement.   ____________________________________________   LABS (all labs ordered are listed, but only abnormal results are displayed)  Labs Reviewed  RESP PANEL BY RT-PCR (FLU A&B, COVID) ARPGX2 - Abnormal; Notable for the following components:      Result Value   Influenza A by PCR POSITIVE (*)    All other components within normal limits  COMPREHENSIVE METABOLIC PANEL - Abnormal; Notable for the following components:   Sodium 130 (*)    Chloride 96 (*)    Glucose, Bld 392 (*)    Total Protein 8.2 (*)    AST 112 (*)    ALT 71 (*)    All other components within normal limits  CBC WITH DIFFERENTIAL/PLATELET - Abnormal; Notable for the following components:   Lymphs Abs 0.4 (*)    All other components within normal limits  URINALYSIS, ROUTINE W REFLEX MICROSCOPIC - Abnormal; Notable for the following components:   Glucose, UA >1,000 (*)    Hgb urine dipstick TRACE (*)    Ketones, ur TRACE (*)    Protein, ur 100 (*)    Bacteria, UA FEW (*)    All other components within normal limits  CBG MONITORING, ED - Abnormal; Notable for the following components:   Glucose-Capillary 333 (*)    All other components within normal limits  LACTIC ACID, PLASMA  POC URINE PREG, ED   ____________________________________________  EKG   ____________________________________________  RADIOLOGY   DG Chest 2 View  Result Date: 02/27/2021 CLINICAL DATA:  Sore throat, dry cough, chills, body aches x 1 week. Pt sts hx of asthma. No known heart conditions. Nonsmoker. EXAM: CHEST - 2 VIEW COMPARISON:  Chest x-ray 06/19/2008 FINDINGS: The heart and mediastinal contours are within normal limits. Low lung volumes. No focal consolidation. No pulmonary edema. No pleural effusion. No pneumothorax. No acute osseous abnormality. IMPRESSION: No active cardiopulmonary disease. Electronically Signed   By: Iven Finn M.D.    On: 02/27/2021 16:49    ____________________________________________    PROCEDURES  Procedure(s) performed:     Procedures     Medications  sodium chloride 0.9 % bolus 1,000 mL (1,000 mLs Intravenous New Bag/Given 02/27/21 1712)     ____________________________________________   INITIAL IMPRESSION / ASSESSMENT AND PLAN / ED COURSE  Pertinent labs & imaging results that were available during my care of the patient were reviewed by me and considered in my medical decision making (see chart for details).    Assessment and Plan:   Influenza A 50 year old female presents to the emergency department with flulike symptoms for the past week.  Patient was tachycardic at triage but vital signs were otherwise reassuring.  Patient's blood glucose level returned at 392.  She states that this is more or less baseline for her.  She tested positive for influenza A.  Lactic was within reference range.  White blood cell count was within reference range.  Urinalysis shows no signs of UTI.  Patient's blood glucose trended down from 392 to 333 with a liter of normal saline.  She stated that she felt improved.  Recommended rest and hydration at home and a work note was provided.   ____________________________________________  FINAL CLINICAL IMPRESSION(S) / ED DIAGNOSES  Final diagnoses:  Influenza A      NEW MEDICATIONS STARTED DURING THIS VISIT:  ED Discharge Orders     None           This chart was dictated using voice recognition software/Dragon. Despite best efforts to proofread, errors can occur which can change the meaning. Any change was purely unintentional.     Lannie Fields, PA-C 02/27/21 1905    Vladimir Crofts, MD 02/28/21 [phone]

## 2021-02-27 NOTE — ED Triage Notes (Signed)
Pt to ED for sore throat, cough, body aches for a month.  Pt in NAD

## 2021-02-27 NOTE — Discharge Instructions (Signed)
Rest and stay hydrated at home. You can take 650 mg of Tylenol every six hours for fever and body aches.

## 2021-02-27 NOTE — ED Notes (Signed)
Leonides Schanz PA-C aware of POC CBG

## 2021-08-25 LAB — LAB REPORT - SCANNED: EGFR: 106

## 2021-08-26 ENCOUNTER — Other Ambulatory Visit: Payer: Self-pay | Admitting: Primary Care

## 2021-08-26 DIAGNOSIS — Z1231 Encounter for screening mammogram for malignant neoplasm of breast: Secondary | ICD-10-CM

## 2021-09-29 ENCOUNTER — Ambulatory Visit
Admission: RE | Admit: 2021-09-29 | Discharge: 2021-09-29 | Disposition: A | Discharge status: Home / Self Care | Payer: Medicaid Other | Source: Ambulatory Visit | Attending: Primary Care | Admitting: Primary Care

## 2021-09-29 DIAGNOSIS — Z1231 Encounter for screening mammogram for malignant neoplasm of breast: Secondary | ICD-10-CM | POA: Insufficient documentation

## 2022-02-16 ENCOUNTER — Ambulatory Visit: Payer: Medicaid Other

## 2022-04-02 ENCOUNTER — Other Ambulatory Visit: Payer: Self-pay

## 2022-04-02 DIAGNOSIS — R110 Nausea: Secondary | ICD-10-CM | POA: Insufficient documentation

## 2022-04-02 DIAGNOSIS — M545 Low back pain, unspecified: Secondary | ICD-10-CM | POA: Insufficient documentation

## 2022-04-02 DIAGNOSIS — E1165 Type 2 diabetes mellitus with hyperglycemia: Secondary | ICD-10-CM | POA: Diagnosis not present

## 2022-04-02 DIAGNOSIS — R748 Abnormal levels of other serum enzymes: Secondary | ICD-10-CM | POA: Diagnosis not present

## 2022-04-02 DIAGNOSIS — R109 Unspecified abdominal pain: Secondary | ICD-10-CM | POA: Diagnosis not present

## 2022-04-02 LAB — COMPREHENSIVE METABOLIC PANEL
ALT: 28 U/L (ref 0–44)
AST: 24 U/L (ref 15–41)
Albumin: 3.6 g/dL (ref 3.5–5.0)
Alkaline Phosphatase: 85 U/L (ref 38–126)
Anion gap: 11 (ref 5–15)
BUN: 19 mg/dL (ref 6–20)
CO2: 26 mmol/L (ref 22–32)
Calcium: 9.2 mg/dL (ref 8.9–10.3)
Chloride: 93 mmol/L — ABNORMAL LOW (ref 98–111)
Creatinine, Ser: 0.66 mg/dL (ref 0.44–1.00)
GFR, Estimated: >60 mL/min (ref >60)
Glucose, Bld: 645 mg/dL (ref 70–99)
Potassium: 4.4 mmol/L (ref 3.5–5.1)
Sodium: 130 mmol/L — ABNORMAL LOW (ref 135–145)
Total Bilirubin: 0.7 mg/dL (ref 0.3–1.2)
Total Protein: 8.2 g/dL — ABNORMAL HIGH (ref 6.5–8.1)

## 2022-04-02 LAB — URINALYSIS, ROUTINE W REFLEX MICROSCOPIC
Bacteria, UA: NONE SEEN
Bilirubin Urine: NEGATIVE
Glucose, UA: >=500 mg/dL — AB
Hgb urine dipstick: NEGATIVE
Ketones, ur: NEGATIVE mg/dL
Leukocytes,Ua: NEGATIVE
Nitrite: NEGATIVE
Protein, ur: NEGATIVE mg/dL
Specific Gravity, Urine: 1.031 — ABNORMAL HIGH (ref 1.005–1.030)
pH: 5 (ref 5.0–8.0)

## 2022-04-02 LAB — POC URINE PREG, ED: Preg Test, Ur: NEGATIVE

## 2022-04-02 LAB — CBC
HCT: 42.5 % (ref 36.0–46.0)
Hemoglobin: 14.3 g/dL (ref 12.0–15.0)
MCH: 29.7 pg (ref 26.0–34.0)
MCHC: 33.6 g/dL (ref 30.0–36.0)
MCV: 88.2 fL (ref 80.0–100.0)
Platelets: 273 10*3/uL (ref 150–400)
RBC: 4.82 MIL/uL (ref 3.87–5.11)
RDW: 12.7 % (ref 11.5–15.5)
WBC: 5.1 10*3/uL (ref 4.0–10.5)
nRBC: 0 % (ref 0.0–0.2)

## 2022-04-02 LAB — LIPASE, BLOOD: Lipase: 76 U/L — ABNORMAL HIGH (ref 11–51)

## 2022-04-02 MED ORDER — SODIUM CHLORIDE 0.9 % IV BOLUS (SEPSIS)
1000.0000 mL | Freq: Once | INTRAVENOUS | Status: AC
  Administered 2022-04-03: 1000 mL via INTRAVENOUS

## 2022-04-02 MED ORDER — SODIUM CHLORIDE 0.9 % IV BOLUS (SEPSIS)
1000.0000 mL | Freq: Once | INTRAVENOUS | Status: AC
  Administered 2022-04-02: 1000 mL via INTRAVENOUS

## 2022-04-02 NOTE — ED Triage Notes (Signed)
Pt arrives via POV with CC of back and abdominal pain. Pt reports this has been going on for two months. Denies urinary and rectal pain with voiding. Denies diarrhea. Alert and oriented at this time. Appears to be in NAD at this time.

## 2022-04-03 ENCOUNTER — Emergency Department
Admission: EM | Admit: 2022-04-03 | Discharge: 2022-04-03 | Disposition: A | Discharge status: Home / Self Care | Payer: Medicaid Other | Attending: Emergency Medicine | Admitting: Emergency Medicine

## 2022-04-03 ENCOUNTER — Emergency Department: Payer: Medicaid Other

## 2022-04-03 DIAGNOSIS — R109 Unspecified abdominal pain: Secondary | ICD-10-CM

## 2022-04-03 DIAGNOSIS — R739 Hyperglycemia, unspecified: Secondary | ICD-10-CM

## 2022-04-03 LAB — CBG MONITORING, ED
Glucose-Capillary: 399 mg/dL — ABNORMAL HIGH (ref 70–99)
Glucose-Capillary: 331 mg/dL — ABNORMAL HIGH (ref 70–99)

## 2022-04-03 MED ORDER — HYDROCODONE-ACETAMINOPHEN 5-325 MG PO TABS: 1.0000 | ORAL_TABLET | ORAL | 0 refills | Status: DC | PRN

## 2022-04-03 MED ORDER — SODIUM CHLORIDE 0.9 % IV BOLUS (SEPSIS)
1000.0000 mL | Freq: Once | INTRAVENOUS | Status: AC
  Administered 2022-04-03: 1000 mL via INTRAVENOUS

## 2022-04-03 MED ORDER — IOHEXOL 300 MG/ML  SOLN
100.0000 mL | Freq: Once | INTRAMUSCULAR | Status: AC | PRN
  Administered 2022-04-03: 100 mL via INTRAVENOUS

## 2022-04-03 MED ORDER — KETOROLAC TROMETHAMINE 30 MG/ML IJ SOLN
30.0000 mg | Freq: Once | INTRAMUSCULAR | Status: AC
  Administered 2022-04-03: 30 mg via INTRAVENOUS

## 2022-04-03 MED ORDER — EMPAGLIFLOZIN 25 MG PO TABS: 25.0000 mg | ORAL_TABLET | Freq: Every day | ORAL | 1 refills | Status: DC

## 2022-04-03 MED ORDER — ONDANSETRON 4 MG PO TBDP: 4.0000 mg | ORAL_TABLET | Freq: Four times a day (QID) | ORAL | 0 refills | Status: DC | PRN

## 2022-04-03 NOTE — Discharge Instructions (Signed)
You may alternate Tylenol 1000 mg every 6 hours as needed for pain, fever and Ibuprofen 800 mg every 6-8 hours as needed for pain, fever.  Please take Ibuprofen with food.  Do not take more than 4000 mg of Tylenol (acetaminophen) in a 24 hour period. ° °

## 2022-04-03 NOTE — ED Notes (Signed)
Pt transported to CT ?

## 2022-04-03 NOTE — ED Provider Notes (Signed)
[facility] Provider Note    Event Date/Time   First MD Initiated Contact with Patient 04/03/22 0300     (approximate)   History   Back Pain   HPI  Kristy Cunningham is a 52 y.o. female with history of diabetes who presents to the emergency department with 2 months of left-sided abdominal pain that radiates into the left flank.  No fever.  States pain worsened tonight.  Has had nausea but none currently.  No vomiting or diarrhea.  No urinary symptoms.  No injury to her back.  States that her doctor did not refill her Jardiance during their last visit.   History provided by patient.    Past Medical History:  Diagnosis Date   Diabetes mellitus without complication (HCC)     History reviewed. No pertinent surgical history.  MEDICATIONS:  Prior to Admission medications   Medication Sig Start Date End Date Taking? Authorizing Provider  azithromycin (ZITHROMAX Z-PAK) 250 MG tablet Take 2 tablets (500 mg) on  Day 1,  followed by 1 tablet (250 mg) once daily on Days 2 through 5. 06/27/16   Enid Derry, PA-C  nystatin-triamcinolone ointment (MYCOLOG) Apply 1 application topically 2 (two) times daily. 06/27/16   Enid Derry, PA-C  predniSONE (STERAPRED UNI-PAK 21 TAB) 10 MG (21) TBPK tablet Take 6 tablets on day 1, take 5 tablets on day 2, take 4 tablets on day 3, take 3 tablets on day 4, take 2 tablets on day 5, take 1 tablet on day 6 06/27/16   Enid Derry, PA-C    Physical Exam   Triage Vital Signs: ED Triage Vitals  Enc Vitals Group     BP 04/02/22 2143 (!) 175/98     Pulse Rate 04/02/22 2143 (!) 103     Resp 04/02/22 2143 18     Temp 04/02/22 2143 98.1 F (36.7 C)     Temp Source 04/02/22 2143 Oral     SpO2 04/02/22 2143 94 %     Weight 04/02/22 2144 163 lb (73.9 kg)     Height 04/02/22 2144 5' (1.524 m)     Head Circumference --      Peak Flow --      Pain Score 04/02/22 2144 4     Pain Loc --      Pain Edu? --      Excl. in GC? --      Most recent vital signs: Vitals:   04/03/22 0141 04/03/22 0410  BP: (!) 171/87 (!) 180/81  Pulse: 71 85  Resp: 18 15  Temp: 98.9 F (37.2 C)   SpO2: 97% 99%    CONSTITUTIONAL: Alert and oriented and responds appropriately to questions. Well-appearing; well-nourished HEAD: Normocephalic, atraumatic EYES: Conjunctivae clear, pupils appear equal, sclera nonicteric ENT: normal nose; moist mucous membranes NECK: Supple, normal ROM CARD: RRR; S1 and S2 appreciated; no murmurs, no clicks, no rubs, no gallops RESP: Normal chest excursion without splinting or tachypnea; breath sounds clear and equal bilaterally; no wheezes, no rhonchi, no rales, no hypoxia or respiratory distress, speaking full sentences ABD/GI: Normal bowel sounds; non-distended; soft, tender in the left mid and lower abdomen without guarding or rebound BACK: The back appears normal, no step-off or deformity, no CVA tenderness EXT: Normal ROM in all joints; no deformity noted, no edema; no cyanosis SKIN: Normal color for age and race; warm; no rash on exposed skin NEURO: Moves all extremities equally, normal speech PSYCH: The patient's mood and manner  are appropriate.   ED Results / Procedures / Treatments   LABS: (all labs ordered are listed, but only abnormal results are displayed) Labs Reviewed  LIPASE, BLOOD - Abnormal; Notable for the following components:      Result Value   Lipase 76 (*)    All other components within normal limits  COMPREHENSIVE METABOLIC PANEL - Abnormal; Notable for the following components:   Sodium 130 (*)    Chloride 93 (*)    Glucose, Bld 645 (*)    Total Protein 8.2 (*)    All other components within normal limits  URINALYSIS, ROUTINE W REFLEX MICROSCOPIC - Abnormal; Notable for the following components:   Color, Urine STRAW (*)    APPearance CLEAR (*)    Specific Gravity, Urine 1.031 (*)    Glucose, UA >=500 (*)    All other components within normal limits  CBG MONITORING,  ED - Abnormal; Notable for the following components:   Glucose-Capillary 399 (*)    All other components within normal limits  CBC  POC URINE PREG, ED  CBG MONITORING, ED     EKG:  RADIOLOGY: My personal review and interpretation of imaging: CT abdomen pelvis shows no acute abnormality.  I have personally reviewed all radiology reports.   CT ABDOMEN PELVIS W CONTRAST  Result Date: 04/03/2022 CLINICAL DATA:  Left lower quadrant abdominal pain for 2 months EXAM: CT ABDOMEN AND PELVIS WITH CONTRAST TECHNIQUE: Multidetector CT imaging of the abdomen and pelvis was performed using the standard protocol following bolus administration of intravenous contrast. RADIATION DOSE REDUCTION: This exam was performed according to the departmental dose-optimization program which includes automated exposure control, adjustment of the mA and/or kV according to patient size and/or use of iterative reconstruction technique. CONTRAST:  129mL OMNIPAQUE IOHEXOL 300 MG/ML  SOLN COMPARISON:  None Available. FINDINGS: Lower chest: Limited area of bronchiectasis and air trapping in the posterior costophrenic sulcus on the left. Hepatic steatosis even when accounting for postcontrast assessment. Hepatobiliary: No focal liver abnormality.No evidence of biliary obstruction or stone. Pancreas: Unremarkable. Spleen: Unremarkable. Adrenals/Urinary Tract: Negative adrenals. No hydronephrosis or stone. Renal sinus cysts on the left. A few tiny left cortical low densities that are too small for accurate densitometry. Unremarkable bladder. Stomach/Bowel: No obstruction. No appendicitis. Generalized colonic stool but no over distension or impaction Vascular/Lymphatic: No acute vascular abnormality. No mass or adenopathy. Reproductive:No pathologic findings. Other: No ascites or pneumoperitoneum. Musculoskeletal: No acute abnormalities. IMPRESSION: No acute finding or specific cause for symptoms. Hepatic steatosis. Electronically Signed    By: Jorje Guild M.D.   On: 04/03/2022 04:12     PROCEDURES:  Critical Care performed: No   Procedures    IMPRESSION / MDM / ASSESSMENT AND PLAN / ED COURSE  I reviewed the triage vital signs and the nursing notes.    Patient here with 2 months of left lower quadrant, left mid abdominal pain radiating to the left flank that worsened today.     DIFFERENTIAL DIAGNOSIS (includes but not limited to):   Kidney stone, musculoskeletal pain, UTI, malignancy, colitis, diverticulitis   Patient's presentation is most consistent with acute presentation with potential threat to life or bodily function.   PLAN: Workup initiated from triage.  Patient is hyperglycemic but not in DKA.  Lipase minimally elevated but normal LFTs, renal function.  No leukocytosis.  Urine shows no ketones or signs of infection.  Pregnancy test negative.  Will obtain CT of the abdomen pelvis.  Blood sugar has improved after  2 L of IV fluids from 645 down to 399.  Will give another liter.  Patient will need a refill of her Jardiance but she cannot recall the dose that she takes.  No PCP notes in care everywhere or chart review.   MEDICATIONS GIVEN IN ED: Medications  sodium chloride 0.9 % bolus 1,000 mL (0 mLs Intravenous Stopped 04/03/22 0217)  sodium chloride 0.9 % bolus 1,000 mL (0 mLs Intravenous Stopped 04/02/22 2358)  ketorolac (TORADOL) 30 MG/ML injection 30 mg (30 mg Intravenous Given 04/03/22 0408)  sodium chloride 0.9 % bolus 1,000 mL (1,000 mLs Intravenous New Bag/Given 04/03/22 0407)  iohexol (OMNIPAQUE) 300 MG/ML solution 100 mL (100 mLs Intravenous Contrast Given 04/03/22 0346)     ED COURSE: CT abdomen pelvis reviewed and interpreted by myself and the radiologist and shows no acute abnormality.  Will recheck blood sugar after third liter of IV fluids but anticipate discharge home with refill of her Jardiance, short course of pain medication and nausea medicine for symptomatic relief with close follow-up  with GI and PCP as an outpatient.   At this time, I do not feel there is any life-threatening condition present. I reviewed all nursing notes, vitals, pertinent previous records.  All lab and urine results, EKGs, imaging ordered have been independently reviewed and interpreted by myself.  I reviewed all available radiology reports from any imaging ordered this visit.  Based on my assessment, I feel the patient is safe to be discharged home without further emergent workup and can continue workup as an outpatient as needed. Discussed all findings, treatment plan as well as usual and customary return precautions.  They verbalize understanding and are comfortable with this plan.  Outpatient follow-up has been provided as needed.  All questions have been answered.    CONSULTS:  none   OUTSIDE RECORDS REVIEWED:  none       FINAL CLINICAL IMPRESSION(S) / ED DIAGNOSES   Final diagnoses:  Left sided abdominal pain  Hyperglycemia     Rx / DC Orders   ED Discharge Orders          Ordered    empagliflozin (JARDIANCE) 25 MG TABS tablet  Daily before breakfast        04/03/22 0420    HYDROcodone-acetaminophen (NORCO/VICODIN) 5-325 MG tablet  Every 4 hours PRN,   Status:  Discontinued        04/03/22 0439    ondansetron (ZOFRAN-ODT) 4 MG disintegrating tablet  Every 6 hours PRN        04/03/22 0439    HYDROcodone-acetaminophen (NORCO/VICODIN) 5-325 MG tablet  Every 4 hours PRN        04/03/22 0439             Note:  This document was prepared using Dragon voice recognition software and may include unintentional dictation errors.   Greenleigh Kauth, Delice Bison, DO 04/03/22 [phone]

## 2022-06-30 DIAGNOSIS — R194 Change in bowel habit: Secondary | ICD-10-CM | POA: Insufficient documentation

## 2022-06-30 DIAGNOSIS — E119 Type 2 diabetes mellitus without complications: Secondary | ICD-10-CM | POA: Insufficient documentation

## 2022-06-30 DIAGNOSIS — G8929 Other chronic pain: Secondary | ICD-10-CM | POA: Insufficient documentation

## 2022-06-30 DIAGNOSIS — Z794 Long term (current) use of insulin: Secondary | ICD-10-CM | POA: Insufficient documentation

## 2022-06-30 DIAGNOSIS — K921 Melena: Secondary | ICD-10-CM | POA: Insufficient documentation

## 2022-08-08 ENCOUNTER — Ambulatory Visit: Payer: Medicaid Other

## 2022-08-08 DIAGNOSIS — K921 Melena: Secondary | ICD-10-CM

## 2022-08-08 DIAGNOSIS — G8929 Other chronic pain: Secondary | ICD-10-CM

## 2022-08-08 DIAGNOSIS — K640 First degree hemorrhoids: Secondary | ICD-10-CM

## 2022-08-08 DIAGNOSIS — R194 Change in bowel habit: Secondary | ICD-10-CM

## 2022-08-08 DIAGNOSIS — R1032 Left lower quadrant pain: Secondary | ICD-10-CM

## 2022-10-16 ENCOUNTER — Other Ambulatory Visit: Payer: Self-pay

## 2022-10-16 ENCOUNTER — Encounter: Payer: Self-pay | Admitting: Emergency Medicine

## 2022-10-16 ENCOUNTER — Emergency Department
Admission: EM | Admit: 2022-10-16 | Discharge: 2022-10-16 | Disposition: A | Discharge status: Home / Self Care | Payer: Medicaid Other | Attending: Emergency Medicine | Admitting: Emergency Medicine

## 2022-10-16 DIAGNOSIS — M25552 Pain in left hip: Secondary | ICD-10-CM | POA: Diagnosis present

## 2022-10-16 DIAGNOSIS — M5432 Sciatica, left side: Secondary | ICD-10-CM | POA: Diagnosis not present

## 2022-10-16 MED ORDER — OXYCODONE-ACETAMINOPHEN 5-325 MG PO TABS: 1.0000 | ORAL_TABLET | Freq: Four times a day (QID) | ORAL | 0 refills | Status: DC | PRN

## 2022-10-16 MED ORDER — ONDANSETRON 4 MG PO TBDP
4.0000 mg | ORAL_TABLET | Freq: Once | ORAL | Status: AC
  Administered 2022-10-16: 4 mg via ORAL
  Filled 2022-10-16: qty 1

## 2022-10-16 MED ORDER — OXYCODONE-ACETAMINOPHEN 5-325 MG PO TABS
2.0000 | ORAL_TABLET | Freq: Once | ORAL | Status: AC
  Administered 2022-10-16: 2 via ORAL
  Filled 2022-10-16: qty 2

## 2022-10-16 MED ORDER — NAPROXEN 375 MG PO TABS: 375.0000 mg | ORAL_TABLET | Freq: Two times a day (BID) | ORAL | 0 refills | Status: AC

## 2022-10-16 MED ORDER — PREDNISONE 20 MG PO TABS
60.0000 mg | ORAL_TABLET | Freq: Once | ORAL | Status: AC
  Administered 2022-10-16: 60 mg via ORAL
  Filled 2022-10-16: qty 3

## 2022-10-16 MED ORDER — KETOROLAC TROMETHAMINE 60 MG/2ML IM SOLN
60.0000 mg | Freq: Once | INTRAMUSCULAR | Status: AC
  Administered 2022-10-16: 60 mg via INTRAMUSCULAR
  Filled 2022-10-16: qty 2

## 2022-10-16 MED ORDER — PREDNISONE 10 MG PO TABS: ORAL_TABLET | ORAL | 0 refills | Status: AC

## 2022-10-16 NOTE — ED Triage Notes (Signed)
Pt states left hip pain that started this past thursday. Pt states history of bursitis to the hips, but states that does not feel like the pain.

## 2022-10-16 NOTE — ED Notes (Signed)
See triage note  Presents with pain to left hip  States sxs' started in Jan  States she thinks the pain is worse  Having pain from hip and moving into left leg  Ambulates with slight limp d/t pain

## 2022-10-16 NOTE — ED Provider Notes (Signed)
[facility] Provider Note    Event Date/Time   First MD Initiated Contact with Patient 10/16/22 [phone]     (approximate)   History   Hip Pain (Left)   HPI  Kristy Cunningham is a 52 y.o. female here with left hip pain.  The patient states that for the last 2 days, she separatively worsening aching, throbbing, severe, left hip pain that radiates down the back of her left leg.  She has had associated severe pain with any kind of movement or pressure on the leg.  She has had no fevers or chills.  No trauma.  No numbness or weakness.  No other major medical complaints.     Physical Exam   Triage Vital Signs: ED Triage Vitals  Encounter Vitals Group     BP 10/16/22 0901 (!) 144/76     Systolic BP Percentile --      Diastolic BP Percentile --      Pulse Rate 10/16/22 0901 (!) 106     Resp 10/16/22 0901 18     Temp 10/16/22 0901 98.7 F (37.1 C)     Temp Source 10/16/22 0901 Oral     SpO2 10/16/22 0901 98 %     Weight 10/16/22 0859 156 lb (70.8 kg)     Height 10/16/22 0859 5' (1.524 m)     Head Circumference --      Peak Flow --      Pain Score 10/16/22 0859 8     Pain Loc --      Pain Education --      Exclude from Growth Chart --     Most recent vital signs: Vitals:   10/16/22 0901  BP: (!) 144/76  Pulse: (!) 106  Resp: 18  Temp: 98.7 F (37.1 C)  SpO2: 98%     General: Awake, no distress.  CV:  Good peripheral perfusion.  Resp:  Normal work of breathing.  Lungs clear. Abd:  No distention.  Other:  Significant tenderness palpation over the left sciatic nerve exit.  Motor strength out of 5 bilateral upper and lower extremities bilaterally.  Normal sensation light touch.  Quad reflexes 2+ and symmetric.   ED Results / Procedures / Treatments   Labs (all labs ordered are listed, but only abnormal results are displayed) Labs Reviewed - No data to display   EKG    RADIOLOGY    I also independently reviewed and agree with  radiologist interpretations.   PROCEDURES:  Critical Care performed: No  MEDICATIONS ORDERED IN ED: Medications  ondansetron (ZOFRAN-ODT) disintegrating tablet 4 mg (has no administration in time range)  oxyCODONE-acetaminophen (PERCOCET/ROXICET) 5-325 MG per tablet 2 tablet (has no administration in time range)  ketorolac (TORADOL) injection 60 mg (has no administration in time range)  predniSONE (DELTASONE) tablet 60 mg (has no administration in time range)     IMPRESSION / MDM / ASSESSMENT AND PLAN / ED COURSE  I reviewed the triage vital signs and the nursing notes.                              Differential diagnosis includes, but is not limited to, sciatica, bursitis, lumbar radiculopathy, myositis, myopathy  Patient's presentation is most consistent with acute, uncomplicated illness.  The patient is on the cardiac monitor to evaluate for evidence of arrhythmia and/or significant heart rate changes  52 yo F here with left buttocks and thigh  pain. Clinically high suspicion for sciatica, exacerbated by sitting and bending @ work. No weakness, numbness or signs of major cor/neurological compromise. No loss of bowel or bladder or signs of cauda equina. Distal vasculature is intact. No midline L spine TTP. No abd pain or signs to suggest referred intra-abd or retroperitoneal issue. Will d/c with steroids, analgesia, and referral to [facility].    FINAL CLINICAL IMPRESSION(S) / ED DIAGNOSES   Final diagnoses:  Sciatica, left side     Rx / DC Orders   ED Discharge Orders          Ordered    predniSONE (DELTASONE) 10 MG tablet  Daily        10/16/22 0955    oxyCODONE-acetaminophen (PERCOCET) 5-325 MG tablet  Every 6 hours PRN        10/16/22 0955    naproxen (NAPROSYN) 375 MG tablet  2 times daily with meals        10/16/22 0955             Note:  This document was prepared using Dragon voice recognition software and may include unintentional dictation errors.    [name], MD 10/16/22 [phone]

## 2022-10-26 DIAGNOSIS — S39012A Strain of muscle, fascia and tendon of lower back, initial encounter: Secondary | ICD-10-CM | POA: Insufficient documentation

## 2022-10-26 DIAGNOSIS — M533 Sacrococcygeal disorders, not elsewhere classified: Secondary | ICD-10-CM | POA: Insufficient documentation

## 2022-10-26 DIAGNOSIS — M47816 Spondylosis without myelopathy or radiculopathy, lumbar region: Secondary | ICD-10-CM | POA: Insufficient documentation

## 2022-10-26 DIAGNOSIS — G8929 Other chronic pain: Secondary | ICD-10-CM | POA: Insufficient documentation

## 2022-10-26 DIAGNOSIS — M4316 Spondylolisthesis, lumbar region: Secondary | ICD-10-CM | POA: Insufficient documentation

## 2022-10-26 DIAGNOSIS — M5416 Radiculopathy, lumbar region: Secondary | ICD-10-CM | POA: Insufficient documentation

## 2022-10-26 DIAGNOSIS — M545 Low back pain, unspecified: Secondary | ICD-10-CM | POA: Insufficient documentation

## 2022-10-26 HISTORY — DX: Strain of muscle, fascia and tendon of lower back, initial encounter: S39.012A

## 2022-12-14 DIAGNOSIS — E119 Type 2 diabetes mellitus without complications: Secondary | ICD-10-CM | POA: Insufficient documentation

## 2022-12-14 DIAGNOSIS — M7989 Other specified soft tissue disorders: Secondary | ICD-10-CM | POA: Insufficient documentation

## 2022-12-16 ENCOUNTER — Other Ambulatory Visit: Payer: Self-pay | Admitting: Physician Assistant

## 2022-12-16 DIAGNOSIS — R2242 Localized swelling, mass and lump, left lower limb: Secondary | ICD-10-CM

## 2022-12-21 ENCOUNTER — Emergency Department
Admission: EM | Admit: 2022-12-21 | Discharge: 2022-12-21 | Disposition: A | Discharge status: Home / Self Care | Payer: Medicaid Other | Attending: Emergency Medicine | Admitting: Emergency Medicine

## 2022-12-21 ENCOUNTER — Other Ambulatory Visit: Payer: Self-pay

## 2022-12-21 ENCOUNTER — Encounter: Payer: Self-pay | Admitting: *Deleted

## 2022-12-21 DIAGNOSIS — M5442 Lumbago with sciatica, left side: Secondary | ICD-10-CM | POA: Insufficient documentation

## 2022-12-21 DIAGNOSIS — M5432 Sciatica, left side: Secondary | ICD-10-CM

## 2022-12-21 DIAGNOSIS — M545 Low back pain, unspecified: Secondary | ICD-10-CM | POA: Diagnosis present

## 2022-12-21 MED ORDER — PREDNISONE 50 MG PO TABS: 50.0000 mg | ORAL_TABLET | Freq: Every day | ORAL | 0 refills | Status: AC

## 2022-12-21 MED ORDER — KETOROLAC TROMETHAMINE 30 MG/ML IJ SOLN
30.0000 mg | Freq: Once | INTRAMUSCULAR | Status: AC
  Administered 2022-12-21: 30 mg via INTRAMUSCULAR
  Filled 2022-12-21: qty 1

## 2022-12-21 MED ORDER — OXYCODONE-ACETAMINOPHEN 5-325 MG PO TABS
1.0000 | ORAL_TABLET | Freq: Once | ORAL | Status: AC
  Administered 2022-12-21: 1 via ORAL
  Filled 2022-12-21: qty 1

## 2022-12-21 NOTE — ED Notes (Signed)
See triage notes. Patient c/o sciatica and bursitis

## 2022-12-21 NOTE — ED Triage Notes (Signed)
Pt with hx of sciatica is here due to flare up with sharp shooting pain down into left leg.

## 2022-12-21 NOTE — ED Provider Notes (Signed)
[facility] Provider Note    Event Date/Time   First MD Initiated Contact with Patient 12/21/22 1209     (approximate)   History   Sciatica   HPI  Kristy Cunningham is a 52 y.o. female presents to the emergency department with back pain radiating to her left leg.  States that she had an injury over the summer and since that time has been having pain in her back.  Over the past couple of days significant pain to her lower back that radiates down her left leg.  Denies any numbness or weakness.  Taking Tylenol.  MRI was done as an outpatient and she has followed with orthopedic surgery.  Denies any new numbness, weakness, urinary or bowel incontinence.  No falls or trauma.  No dysuria, urinary urgency or frequency.     Physical Exam   Triage Vital Signs: ED Triage Vitals  Encounter Vitals Group     BP 12/21/22 1205 (!) 180/115     Systolic BP Percentile --      Diastolic BP Percentile --      Pulse Rate 12/21/22 1205 (!) 139     Resp 12/21/22 1205 (!) 22     Temp 12/21/22 1205 97.8 F (36.6 C)     Temp Source 12/21/22 1205 Oral     SpO2 12/21/22 1205 98 %     Weight --      Height --      Head Circumference --      Peak Flow --      Pain Score 12/21/22 1201 10     Pain Loc --      Pain Education --      Exclude from Growth Chart --     Most recent vital signs: Vitals:   12/21/22 1205  BP: (!) 180/115  Pulse: (!) 139  Resp: (!) 22  Temp: 97.8 F (36.6 C)  SpO2: 98%    Physical Exam Constitutional:      General: She is in acute distress.     Appearance: She is well-developed.  HENT:     Head: Atraumatic.  Eyes:     Conjunctiva/sclera: Conjunctivae normal.  Cardiovascular:     Rate and Rhythm: Regular rhythm.  Pulmonary:     Effort: No respiratory distress.  Abdominal:     General: There is no distension.  Musculoskeletal:        General: Normal range of motion.     Cervical back: Normal range of motion.     Comments: No  midline cervical or thoracic tenderness to palpation.  Left tenderness to palpation of the buttocks.  +2 DP pulses that are equal bilaterally.  Sensation intact.  5/5 strength bilateral lower extremities.  No saddle anesthesia.  Skin:    General: Skin is warm.  Neurological:     Mental Status: She is alert. Mental status is at baseline.      IMPRESSION / MDM / ASSESSMENT AND PLAN / ED COURSE  I reviewed the triage vital signs and the nursing notes.  Differential diagnosis including sciatica, piriformis syndrome, radiculopathy, stenosis    Labs (all labs ordered are listed, but only abnormal results are displayed) Labs interpreted as -    Labs Reviewed - No data to display  No concern for fracture or dislocation.  Does not meet criteria for upper motor neuron signs, no red flags, no urinary or bowel incontinence.  Does not meet criteria for an emergent MRI.  Given  IM ketorolac, p.o. Percocet  Reevaluation significant improvement of her pain.  States that she is feeling much better.  Discussed close follow-up as an outpatient with her primary care provider.  Discussed possible need to have a referral to the pain clinic.  Discussed NSAIDs, Tylenol, Lidoderm patches.  Will do a short course of steroids.  Given return precautions for reasons that would require an emergent MRI.     PROCEDURES:  Critical Care performed: No  Procedures  Patient's presentation is most consistent with acute complicated illness / injury requiring diagnostic workup.   MEDICATIONS ORDERED IN ED: Medications  ketorolac (TORADOL) 30 MG/ML injection 30 mg (30 mg Intramuscular Given 12/21/22 1224)  oxyCODONE-acetaminophen (PERCOCET/ROXICET) 5-325 MG per tablet 1 tablet (1 tablet Oral Given 12/21/22 1223)    FINAL CLINICAL IMPRESSION(S) / ED DIAGNOSES   Final diagnoses:  Sciatica of left side     Rx / DC Orders   ED Discharge Orders          Ordered    predniSONE (DELTASONE) 50 MG tablet  Daily  with breakfast        12/21/22 1312             Note:  This document was prepared using Dragon voice recognition software and may include unintentional dictation errors.   Corena Herter, MD 12/21/22 1320

## 2022-12-21 NOTE — Discharge Instructions (Addendum)
You can use over-the-counter Lidoderm patches.  You are given a prescription for steroid.  Pain control:  Ibuprofen (motrin/aleve/advil) - You can take 3 tablets (600 mg) every 6 hours as needed for pain/fever.  Acetaminophen (tylenol) - You can take 2 extra strength tablets (1000 mg) every 6 hours as needed for pain/fever.  You can alternate these medications or take them together.  Make sure you eat food/drink water when taking these medications.  Prednisone -you are given a prescription for a steroid.  It is important that you take this medication with food.  This medication can cause an upset stomach.  It also can increase your glucose if you have a history of diabetes, so it is important that you check your glucose frequently while you are on this medication.

## 2023-01-18 ENCOUNTER — Other Ambulatory Visit: Payer: Self-pay | Admitting: Cardiology

## 2023-01-18 DIAGNOSIS — I10 Essential (primary) hypertension: Secondary | ICD-10-CM

## 2023-02-03 ENCOUNTER — Other Ambulatory Visit: Payer: Self-pay | Admitting: Primary Care

## 2023-02-03 ENCOUNTER — Ambulatory Visit
Admission: RE | Admit: 2023-02-03 | Discharge: 2023-02-03 | Disposition: A | Discharge status: Home / Self Care | Payer: Medicaid Other | Source: Ambulatory Visit | Attending: Primary Care | Admitting: Primary Care

## 2023-02-03 DIAGNOSIS — M79673 Pain in unspecified foot: Secondary | ICD-10-CM

## 2023-02-07 NOTE — Progress Notes (Signed)
Patient: Kristy Cunningham  Service Category: E/M  Provider: Oswaldo Done, MD  DOB: Oct 21, 1970  DOS: 02/08/2023  Referring Provider: Sandrea Hughs, NP  MRN: [id number]  Setting: Ambulatory outpatient  PCP: Sandrea Hughs, NP  Type: New Patient  Specialty: Interventional Pain Management    Location: Office  Delivery: Face-to-face     Primary Reason(s) for Visit: Encounter for initial evaluation of one or more chronic problems (new to examiner) potentially causing chronic pain, and posing a threat to normal musculoskeletal function. (Level of risk: High) CC: Foot Pain (Left foot and left knee)  HPI  Kristy Cunningham is a 52 y.o. year old, female patient, who comes for the first time to our practice referred by Sandrea Hughs, NP for our initial evaluation of her chronic pain. She has Spondylolisthesis, lumbar region; Lumbar radiculopathy; Arthropathy of lumbar facet joint; Low back strain; Low back pain; Hematochezia; Diabetes mellitus (HCC); Diabetes mellitus type 2, noninsulin dependent (HCC); Chronic LLQ pain; Change in bowel habits; Lumbar spondylosis; Sacroiliac joint pain; Swelling of lower leg; Chronic pain syndrome; Pharmacologic therapy; Disorder of skeletal system; Problems influencing health status; Abnormal MRI, lumbar spine (11/30/2022) ([facility]); Chronic foot pain (1ry area of Pain) (Left); Chronic knee pain (2ry area of Pain) (Left); Spasm of muscle of lower back; Muscle spasms of lower extremity (Left); Fasciculations of muscle (hamstring) (Left); Allodynia (foot) (Left); Swelling of foot (Left); Protrusion of lumbar intervertebral disc; and Displacement of lumbar intervertebral disc on their problem list. Today she comes in for evaluation of her Foot Pain (Left foot and left knee)  Pain Assessment: Location: Left (and left knee) Foot Radiating: denies Onset: More than a month ago Duration: Chronic pain Quality: Sharp (stinging) Severity: 5 /10 (subjective, self-reported pain  score)  Effect on ADL: limits ADLs Timing: Constant Modifying factors: denies BP: (!) 191/95  HR: (!) 120  Onset and Duration: Present longer than 3 months Cause of pain: Unknown Severity: No change since onset, NAS-11 at its worse: 10/10, NAS-11 at its best: 3/10, NAS-11 now: 3/10, and NAS-11 on the average: 8/10 Timing: Morning, Afternoon, Night, During activity or exercise, and After activity or exercise Aggravating Factors: Prolonged sitting, Prolonged standing, and Walking Alleviating Factors: Medications Associated Problems: Constipation, Spasms, Swelling, and Tingling Quality of Pain: Aching, Annoying, Heavy, Pressure-like, Sharp, Shooting, Throbbing, and Tingling Previous Examinations or Tests: MRI scan, Nerve block, and X-rays Previous Treatments: Physical Therapy, Spinal cord stimulator, and Stretching exercises  Kristy Cunningham is being evaluated for possible interventional pain management therapies for the treatment of her chronic pain.  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  According to the patient the primary area of pain is that of the foot (Left).  The patient describes the pain in the left foot to be over the top of the foot (L5) and in the inner portion of the foot (L4).  She denies any prior foot surgeries, recent x-rays, but she does admit swelling, hyper algesia, and a cold sensation.  She describes the left foot pain to have been present for the past 5 months.  She denies any trauma to the foot but does indicate having heard her back around June 2024.  2 weeks later she began to experience the left lower extremity symptoms.  This was followed by an MRI of the lumbar spine done by [facility] on 11/30/2022.  She has also received physical therapy.  Physical therapy consisted of 1-2 sessions per week for more than 1 month.  She describes  that this did not help her pain. As part of her workup for this left lower extremity pain she underwent an ultrasound that  was negative for any DVTs.  The patient secondary area pain is that of the knee (Left).  The pain in the knee is described to be in the anterior aspect of the knee, just below the patella (infrapatellar region).  She describes that the knee seems to occasionally mechanically give away without any warning or pain.  The patient's third area of complain is that of muscle spasms of the (Left) lower extremity.  The patient describes the spasms/leg fasciculations to be in the posterior aspect of her thigh.  She denies experiencing any type of muscle cramping or spasms in the area of the calf.  She has taken some tizanidine 4 mg which did seem to help.  The patient's fourth area pain is that of the lower back (Midline).  She describes experiencing cramps in her left buttocks.  She denies any prior back surgeries, but she does admit to having had an MRI of the lower back.  She also indicates having had 1 nerve block done at [facility].  She describes the procedure to have been done in the midline however review of the electronic medical record shows the patient to have had a left-sided sacroiliac joint block x1 around 12/14/2022 with some relief.  She does have a history of type 2 insulin-dependent diabetes mellitus but no history of diabetic, or diabetic ketoacidosis.  She describes having been on the insulin only for the past 2 to 3 months.  She does describe that after the injections she experienced some facial flushing/redness.  Review of the electronic medical record confirmed that the patient had a lumbar MRI done at [facility] on 11/30/2022.  According to the MRI at the L2-3 level the patient has a rightward eccentric and posterolateral mixed disc component with mass effect upon the L2 nerve root.  L3-4 shows mixed disc protrusion.  Facet arthropathy.  Moderate left foraminal narrowing.  Mass effect upon the left L3 nerve root.  L4-5 shows spondylotic disc protrusion.  Facet arthropathy.  L5-S1 shows a disc  bulge.  Perry pelvic left renal cyst observed.  No other positive findings described.  Pharmacotherapy has included gabapentin, Zanaflex, tramadol, oxycodone, and hydrocodone.  Currently the only thing that she is on his the gabapentin and the tizanidine (PRN).  Kristy Cunningham has been informed that this initial visit was an evaluation only.  On the follow up appointment I will go over the results, including ordered tests and available interventional therapies. At that time she will have the opportunity to decide whether to proceed with offered therapies or not. In the event that Kristy Cunningham prefers avoiding interventional options, this will conclude our involvement in the case.  Medication management recommendations may be provided upon request.  Patient informed that diagnostic tests may be ordered to assist in identifying underlying causes, narrow the list of differential diagnoses and aid in determining candidacy for (or contraindications to) planned therapeutic interventions.  Historic Controlled Substance Pharmacotherapy Review  PMP and historical list of controlled substances: Gabapentin 100 mg capsule, 1 cap p.o. twice daily (# 60) (last filled on 12/14/2022); tramadol 50 mg tablet, 1 tab p.o. 4 times daily (# 20) (last filled on 11/08/2022); oxycodone/APAP 5/325, 1 tab p.o. twice daily (# 10) (last filled on 10/25/2022); hydrocodone/APAP 5/325 1 tab p.o. twice daily (# 6) (last filled on 04/15/2022). Most recently prescribed opioid analgesics:   None MME/day:  0 mg/day  Historical Monitoring: The patient  reports no history of drug use. List of prior UDS Testing: No results found for: "MDMA", "COCAINSCRNUR", "PCPSCRNUR", "PCPQUANT", "CANNABQUANT", "THCU", "ETH", "CBDTHCR", "D8THCCBX", "D9THCCBX" Historical Background Evaluation: [place] PMP: PDMP reviewed during this encounter. Review of the past 87-months conducted.             PMP NARX Score Report:  Narcotic: 220 Sedative: 110 Stimulant:  000 [place] Department of public safety, offender search: Engineer, mining Information) Non-contributory Risk Assessment Profile: Aberrant behavior: None observed or detected today Risk factors for fatal opioid overdose: None identified today PMP NARX Overdose Risk Score: 340 Fatal overdose hazard ratio (HR): Calculation deferred Non-fatal overdose hazard ratio (HR): Calculation deferred Risk of opioid abuse or dependence: 0.7-3.0% with doses <= 36 MME/day and 6.1-26% with doses >= 120 MME/day. Substance use disorder (SUD) risk level: See below Personal History of Substance Abuse (SUD-Substance use disorder):  Alcohol: Negative  Illegal Drugs: Negative  Rx Drugs: Negative  ORT Risk Level calculation: Low Risk  Opioid Risk Tool - 02/08/23 1122       Family History of Substance Abuse   Alcohol Positive Female   brother and father   Illegal Drugs Negative    Rx Drugs Negative      Personal History of Substance Abuse   Alcohol Negative    Illegal Drugs Negative    Rx Drugs Negative      Age   Age between 32-45 years  No      Psychological Disease   Psychological Disease Negative    Depression Negative      Total Score   Opioid Risk Tool Scoring 3    Opioid Risk Interpretation Low Risk            ORT Scoring interpretation table:  Score <3 = Low Risk for SUD  Score between 4-7 = Moderate Risk for SUD  Score >8 = High Risk for Opioid Abuse   PHQ-2 Depression Scale:  Total score: 3  PHQ-2 Scoring interpretation table: (Score and probability of major depressive disorder)  Score 0 = No depression  Score 1 = 15.4% Probability  Score 2 = 21.1% Probability  Score 3 = 38.4% Probability  Score 4 = 45.5% Probability  Score 5 = 56.4% Probability  Score 6 = 78.6% Probability   PHQ-9 Depression Scale:  Total score: 6  PHQ-9 Scoring interpretation table:  Score 0-4 = No depression  Score 5-9 = Mild depression  Score 10-14 = Moderate depression  Score 15-19 = Moderately severe  depression  Score 20-27 = Severe depression (2.4 times higher risk of SUD and 2.89 times higher risk of overuse)   Pharmacologic Plan: As per protocol, I have not taken over any controlled substance management, pending the results of ordered tests and/or consults.            Initial impression: Pending review of available data and ordered tests.  Meds   Current Outpatient Medications:    azithromycin (ZITHROMAX Z-PAK) 250 MG tablet, Take 2 tablets (500 mg) on  Day 1,  followed by 1 tablet (250 mg) once daily on Days 2 through 5., Disp: 6 each, Rfl: 0   BISACODYL 5 MG EC tablet, Take 5 mg by mouth daily., Disp: , Rfl:    cephALEXin (KEFLEX) 500 MG capsule, Take 500 mg by mouth 2 (two) times daily., Disp: , Rfl:    COLACE 100 MG capsule, Take 100 mg by mouth 2 (two) times daily as needed., Disp: ,  Rfl:    dicyclomine (BENTYL) 20 MG tablet, , Disp: , Rfl:    fluconazole (DIFLUCAN) 150 MG tablet, TAKE 1 TABLET BY MOUTH NOW AS DIRECTED. REPEAT IN 3 DAYS IF NOT BETTER., Disp: , Rfl:    fluticasone (FLONASE) 50 MCG/ACT nasal spray, USE 2 SPRAY(S) IN EACH NOSTRIL ONCE DAILY FOR ALLERGIES, Disp: , Rfl:    Glucose Blood (BLOOD GLUCOSE TEST STRIPS 333 VI), SMARTSIG:Via Meter Daily, Disp: , Rfl:    Insulin Pen Needle (UNIFINE PENTIPS) 31G X 6 MM MISC, , Disp: , Rfl:    LANTUS SOLOSTAR 100 UNIT/ML Solostar Pen, Inject into the skin., Disp: , Rfl:    losartan-hydrochlorothiazide (HYZAAR) 50-12.5 MG tablet, , Disp: , Rfl:    MIRALAX 17 GM/SCOOP powder, Take by mouth., Disp: , Rfl:    omeprazole (PRILOSEC) 20 MG capsule, , Disp: , Rfl:    sertraline (ZOLOFT) 100 MG tablet, Take 100 mg by mouth daily., Disp: , Rfl:    tiZANidine (ZANAFLEX) 4 MG tablet, Take 4 mg by mouth 3 (three) times daily., Disp: , Rfl:    TRULICITY 1.5 MG/0.5ML SOAJ, Inject into the skin., Disp: , Rfl:   Imaging Review  Lumbosacral Imaging: (11/30/2022) LUMBAR MRI FINDINGS: ([facility]) Conus terminates at T12. Lower thoracic  spondylosis is moderate.   T12-L1 and L1-2 show no compressive disc abnormality.  L2-3 shows mixed disc protrusion. Rightward eccentric and posterolateral mixed disc component. Mass effect upon the L2 nerve root.  L3-4 shows mixed disc protrusion. Facet arthropathy. Moderate left foraminal narrowing. Mass effect upon the left L3 nerve root.  L4-5 shows spondylotic disc protrusion. Facet arthropathy. No nerve root effacement.  L5-S1 shows disc bulge. No spondylolysis. No fracture. Peripelvic left renal cysts.   CONCLUSION:  1. Mixed spondylotic disc protrusion rightward eccentric and posterolateral at L2-3. Mass effect upon the L2 nerve root.  2. Mixed spondylotic disc protrusion leftward eccentric and posterolateral at L3-4. Mass effect upon the L3 nerve root.  3. 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   Foot Imaging: Foot-R DG Complete: Results for orders placed during the hospital encounter of 10/24/17 DG Foot Complete Right  Narrative CLINICAL DATA:  Stubbed foot between second and third toes last Thursday, increased pain since Sunday night, redness, bruising  EXAM: RIGHT FOOT COMPLETE - 3+ VIEW  COMPARISON:  RIGHT fifth toe radiograph 09/12/2012  FINDINGS: Osseous mineralization normal.  Joint spaces preserved.  No acute fracture, dislocation, or bone destruction.  Soft tissue swelling at dorsum of foot overlying the distal metatarsals.  IMPRESSION: No acute osseous abnormalities.   Electronically Signed By: Ulyses Southward M.D. On: 10/24/2017 17:13  Complexity Note: Imaging results reviewed.                         ROS  Cardiovascular: High blood pressure Pulmonary or Respiratory: Snoring  Neurological: No reported neurological signs or symptoms such as seizures, abnormal skin sensations, urinary and/or fecal incontinence, being born with an abnormal open spine and/or a tethered spinal cord Psychological-Psychiatric: No reported psychological or psychiatric signs or  symptoms such as difficulty sleeping, anxiety, depression, delusions or hallucinations (schizophrenial), mood swings (bipolar disorders) or suicidal ideations or attempts Gastrointestinal: Reflux or heatburn and Irregular, infrequent bowel movements (Constipation) Genitourinary: No reported renal or genitourinary signs or symptoms such as difficulty voiding or producing urine, peeing blood, non-functioning kidney, kidney stones, difficulty emptying the bladder, difficulty controlling the flow of urine, or chronic kidney disease Hematological: No reported hematological signs or symptoms  such as prolonged bleeding, low or poor functioning platelets, bruising or bleeding easily, hereditary bleeding problems, low energy levels due to low hemoglobin or being anemic Endocrine: High blood sugar controlled without the use of insulin (NIDDM) Rheumatologic: No reported rheumatological signs and symptoms such as fatigue, joint pain, tenderness, swelling, redness, heat, stiffness, decreased range of motion, with or without associated rash Musculoskeletal: Negative for myasthenia gravis, muscular dystrophy, multiple sclerosis or malignant hyperthermia Work History: Working full time  Allergies  Kristy Cunningham is allergic to gnp budesonide nasal spray [budesonide].  Laboratory Chemistry Profile   Renal Lab Results  Component Value Date   BUN 14 02/08/2023   CREATININE 0.82 02/08/2023   BCR 17 02/08/2023   GFRNONAA >60 04/02/2022   PROTEINUR NEGATIVE 04/02/2022     Electrolytes Lab Results  Component Value Date   NA 139 02/08/2023   K 4.1 02/08/2023   CL 97 02/08/2023   CALCIUM 10.4 (H) 02/08/2023   MG 2.1 02/08/2023     Hepatic Lab Results  Component Value Date   AST 20 02/08/2023   ALT 28 04/02/2022   ALBUMIN 4.4 02/08/2023   ALKPHOS 107 02/08/2023   LIPASE 76 (H) 04/02/2022     ID Lab Results  Component Value Date   SARSCOV2NAA NEGATIVE 02/27/2021   PREGTESTUR NEGATIVE 04/02/2022      Bone Lab Results  Component Value Date   25OHVITD1 WILL FOLLOW 02/08/2023   25OHVITD2 WILL FOLLOW 02/08/2023   25OHVITD3 WILL FOLLOW 02/08/2023     Endocrine Lab Results  Component Value Date   GLUCOSE 192 (H) 02/08/2023   GLUCOSEU >=500 (A) 04/02/2022     Neuropathy Lab Results  Component Value Date   VITAMINB12 1,674 (H) 02/08/2023     CNS No results found for: "COLORCSF", "APPEARCSF", "RBCCOUNTCSF", "WBCCSF", "POLYSCSF", "LYMPHSCSF", "EOSCSF", "PROTEINCSF", "GLUCCSF", "JCVIRUS", "CSFOLI", "IGGCSF", "LABACHR", "ACETBL"   Inflammation (CRP: Acute  ESR: Chronic) Lab Results  Component Value Date   CRP 2 02/08/2023   ESRSEDRATE 68 (H) 02/08/2023   LATICACIDVEN 1.8 02/27/2021     Rheumatology No results found for: "RF", "ANA", "LABURIC", "URICUR", "LYMEIGGIGMAB", "LYMEABIGMQN", "HLAB27"   Coagulation Lab Results  Component Value Date   PLT 273 04/02/2022     Cardiovascular Lab Results  Component Value Date   HGB 14.3 04/02/2022   HCT 42.5 04/02/2022     Screening Lab Results  Component Value Date   SARSCOV2NAA NEGATIVE 02/27/2021   PREGTESTUR NEGATIVE 04/02/2022     Cancer No results found for: "CEA", "CA125", "LABCA2"   Allergens No results found for: "ALMOND", "APPLE", "ASPARAGUS", "AVOCADO", "BANANA", "BARLEY", "BASIL", "BAYLEAF", "GREENBEAN", "LIMABEAN", "WHITEBEAN", "BEEFIGE", "REDBEET", "BLUEBERRY", "BROCCOLI", "CABBAGE", "MELON", "CARROT", "CASEIN", "CASHEWNUT", "CAULIFLOWER", "CELERY"     Note: Lab results reviewed.  PFSH  Drug: Kristy Cunningham  reports no history of drug use. Alcohol:  reports no history of alcohol use. Tobacco:  reports that she has never smoked. She has never used smokeless tobacco. Medical:  has a past medical history of Diabetes mellitus without complication (HCC). Family: family history includes Breast cancer in her paternal grandmother.  History reviewed. No pertinent surgical history. Active Ambulatory Problems     Diagnosis Date Noted   Spondylolisthesis, lumbar region 10/26/2022   Lumbar radiculopathy 10/26/2022   Arthropathy of lumbar facet joint 10/26/2022   Low back strain 10/26/2022   Low back pain 10/26/2022   Hematochezia 06/30/2022   Diabetes mellitus (HCC) 12/14/2022   Diabetes mellitus type 2, noninsulin dependent (HCC) 06/30/2022   Chronic LLQ  pain 06/30/2022   Change in bowel habits 06/30/2022   Lumbar spondylosis 10/26/2022   Sacroiliac joint pain 10/26/2022   Swelling of lower leg 12/14/2022   Chronic pain syndrome 02/08/2023   Pharmacologic therapy 02/08/2023   Disorder of skeletal system 02/08/2023   Problems influencing health status 02/08/2023   Abnormal MRI, lumbar spine (11/30/2022) ([facility]) 02/08/2023   Chronic foot pain (1ry area of Pain) (Left) 02/08/2023   Chronic knee pain (2ry area of Pain) (Left) 02/08/2023   Spasm of muscle of lower back 02/08/2023   Muscle spasms of lower extremity (Left) 02/08/2023   Fasciculations of muscle (hamstring) (Left) 02/08/2023   Allodynia (foot) (Left) 02/08/2023   Swelling of foot (Left) 02/08/2023   Protrusion of lumbar intervertebral disc 02/08/2023   Displacement of lumbar intervertebral disc 02/08/2023   Resolved Ambulatory Problems    Diagnosis Date Noted   No Resolved Ambulatory Problems   Past Medical History:  Diagnosis Date   Diabetes mellitus without complication (HCC)    Constitutional Exam  General appearance: Well nourished, well developed, and well hydrated. In no apparent acute distress Vitals:   02/08/23 1111  BP: (!) 191/95  Pulse: (!) 120  Temp: (!) 97.3 F (36.3 C)  SpO2: 98%  Weight: 153 lb (69.4 kg)  Height: 5' (1.524 m)   BMI Assessment: Estimated body mass index is 29.88 kg/m as calculated from the following:   Height as of this encounter: 5' (1.524 m).   Weight as of this encounter: 153 lb (69.4 kg).  BMI interpretation table: BMI level Category Range association with higher incidence  of chronic pain  <18 kg/m2 Underweight   18.5-24.9 kg/m2 Ideal body weight   25-29.9 kg/m2 Overweight Increased incidence by 20%  30-34.9 kg/m2 Obese (Class I) Increased incidence by 68%  35-39.9 kg/m2 Severe obesity (Class II) Increased incidence by 136%  >40 kg/m2 Extreme obesity (Class III) Increased incidence by 254%   Patient's current BMI Ideal Body weight  Body mass index is 29.88 kg/m. Ideal body weight: 45.5 kg (100 lb 4.9 oz) Adjusted ideal body weight: 55.1 kg (121 lb 6.2 oz)   BMI Readings from Last 4 Encounters:  02/08/23 29.88 kg/m  10/16/22 30.47 kg/m  04/02/22 31.83 kg/m  02/27/21 33.20 kg/m   Wt Readings from Last 4 Encounters:  02/08/23 153 lb (69.4 kg)  10/16/22 156 lb (70.8 kg)  04/02/22 163 lb (73.9 kg)  02/27/21 170 lb (77.1 kg)    Psych/Mental status: Alert, oriented x 3 (person, place, & time)       Eyes: PERLA Respiratory: No evidence of acute respiratory distress  Assessment  Primary Diagnosis & Pertinent Problem List: The primary encounter diagnosis was Chronic pain syndrome. Diagnoses of Pharmacologic therapy, Disorder of skeletal system, Problems influencing health status, Abnormal MRI, lumbar spine (11/30/2022) ([facility]), Lumbar radiculopathy, Chronic foot pain (1ry area of Pain) (Left), Chronic knee pain (2ry area of Pain) (Left), Spasm of muscle of lower back, Muscle spasms of lower extremity (Left), Fasciculations of muscle (hamstring) (Left), Allodynia (foot) (Left), Swelling of foot (Left), Protrusion of lumbar intervertebral disc, and Displacement of lumbar intervertebral disc were also pertinent to this visit.  Visit Diagnosis (New problems to examiner): 1. Chronic pain syndrome   2. Pharmacologic therapy   3. Disorder of skeletal system   4. Problems influencing health status   5. Abnormal MRI, lumbar spine (11/30/2022) ([facility])   6. Lumbar radiculopathy   7. Chronic foot pain (1ry area of Pain) (Left)   8. Chronic  knee pain  (2ry area of Pain) (Left)   9. Spasm of muscle of lower back   10. Muscle spasms of lower extremity (Left)   11. Fasciculations of muscle (hamstring) (Left)   12. Allodynia (foot) (Left)   13. Swelling of foot (Left)   14. Protrusion of lumbar intervertebral disc   15. Displacement of lumbar intervertebral disc    Plan of Care (Initial workup plan)  Note: Kristy Cunningham was reminded that as per protocol, today's visit has been an evaluation only. We have not taken over the patient's controlled substance management.  Problem-specific plan: No problem-specific Assessment & Plan notes found for this encounter.  Lab Orders         Compliance Drug Analysis, Ur         Comp. Metabolic Panel (12)         Magnesium         Vitamin B12         Sedimentation rate         25-Hydroxy vitamin D Lcms D2+D3         C-reactive protein     Imaging Orders         DG Knee Complete 4 Views Left         DG Lumbar Spine Complete W/Bend     Referral Orders  No referral(s) requested today   Procedure Orders    No procedure(s) ordered today   Pharmacotherapy (current): Medications ordered:  No orders of the defined types were placed in this encounter.  Medications administered during this visit: Kristy Cunningham. Patella "Katie" had no medications administered during this visit.   Analgesic Pharmacotherapy:  Opioid Analgesics: For patients currently taking or requesting to take opioid analgesics, in accordance with [facility] Guidelines, we will assess their risks and indications for the use of these substances. After completing our evaluation, we may offer recommendations, but we no longer take patients for medication management. The prescribing physician will ultimately decide, based on his/her training and level of comfort whether to adopt any of the recommendations, including whether or not to prescribe such medicines.  Membrane stabilizer: To be determined at a later time  Muscle  relaxant: To be determined at a later time  NSAID: To be determined at a later time  Other analgesic(s): To be determined at a later time   Interventional management options: Kristy Cunningham was informed that there is no guarantee that she would be a candidate for interventional therapies. The decision will be based on the results of diagnostic studies, as well as Kristy Cunningham's risk profile.  Procedure(s) under consideration:  Pending results of ordered studies      Interventional Therapies  Risk Factors  Considerations  Medical Comorbidities:     Planned  Pending:   Diagnostic/therapeutic left-sided L3-4 LESI #1    Under consideration:   Diagnostic/therapeutic left-sided L3-4 LESI #1    Completed:   None at this time   Therapeutic  Palliative (PRN) options:   None established   Completed by other providers:   None reported      Provider-requested follow-up: Return for (ECT): (L) L3-4 LESI #1, for review of ordered tests.  No future appointments.   Duration of encounter: 46 minutes.  Total time on encounter, as per AMA guidelines included both the face-to-face and non-face-to-face time personally spent by the physician and/or other qualified health care professional(s) on the day of the encounter (includes time in activities that require the physician or  other qualified health care professional and does not include time in activities normally performed by clinical staff). Physician's time may include the following activities when performed: Preparing to see the patient (e.g., pre-charting review of records, searching for previously ordered imaging, lab work, and nerve conduction tests) Review of prior analgesic pharmacotherapies. Reviewing PMP Interpreting ordered tests (e.g., lab work, imaging, nerve conduction tests) Performing post-procedure evaluations, including interpretation of diagnostic procedures Obtaining and/or reviewing separately obtained  history Performing a medically appropriate examination and/or evaluation Counseling and educating the patient/family/caregiver Ordering medications, tests, or procedures Referring and communicating with other health care professionals (when not separately reported) Documenting clinical information in the electronic or other health record Independently interpreting results (not separately reported) and communicating results to the patient/ family/caregiver Care coordination (not separately reported)  Note by: Oswaldo Done, MD (TTS technology used. I apologize for any typographical errors that were not detected and corrected.) Date: 02/08/2023; Time: 5:46 AM

## 2023-02-08 ENCOUNTER — Encounter: Payer: Self-pay | Admitting: Pain Medicine

## 2023-02-08 ENCOUNTER — Ambulatory Visit: Payer: Medicaid Other | Attending: Pain Medicine | Admitting: Pain Medicine

## 2023-02-08 ENCOUNTER — Ambulatory Visit
Admission: RE | Admit: 2023-02-08 | Discharge: 2023-02-08 | Disposition: A | Discharge status: Home / Self Care | Payer: Medicaid Other | Source: Ambulatory Visit | Attending: Pain Medicine | Admitting: Pain Medicine

## 2023-02-08 VITALS — BP 191/95 | HR 120 | Temp 97.3°F | Ht 60.0 in | Wt 153.0 lb

## 2023-02-08 DIAGNOSIS — R937 Abnormal findings on diagnostic imaging of other parts of musculoskeletal system: Secondary | ICD-10-CM

## 2023-02-08 DIAGNOSIS — M7989 Other specified soft tissue disorders: Secondary | ICD-10-CM | POA: Insufficient documentation

## 2023-02-08 DIAGNOSIS — G8929 Other chronic pain: Secondary | ICD-10-CM

## 2023-02-08 DIAGNOSIS — G894 Chronic pain syndrome: Secondary | ICD-10-CM | POA: Insufficient documentation

## 2023-02-08 DIAGNOSIS — M5126 Other intervertebral disc displacement, lumbar region: Secondary | ICD-10-CM

## 2023-02-08 DIAGNOSIS — R208 Other disturbances of skin sensation: Secondary | ICD-10-CM

## 2023-02-08 DIAGNOSIS — M62838 Other muscle spasm: Secondary | ICD-10-CM | POA: Insufficient documentation

## 2023-02-08 DIAGNOSIS — Z79899 Other long term (current) drug therapy: Secondary | ICD-10-CM | POA: Insufficient documentation

## 2023-02-08 DIAGNOSIS — R253 Fasciculation: Secondary | ICD-10-CM | POA: Diagnosis present

## 2023-02-08 DIAGNOSIS — M62830 Muscle spasm of back: Secondary | ICD-10-CM | POA: Diagnosis present

## 2023-02-08 DIAGNOSIS — Z789 Other specified health status: Secondary | ICD-10-CM | POA: Insufficient documentation

## 2023-02-08 DIAGNOSIS — M5416 Radiculopathy, lumbar region: Secondary | ICD-10-CM | POA: Diagnosis present

## 2023-02-08 DIAGNOSIS — M79672 Pain in left foot: Secondary | ICD-10-CM | POA: Insufficient documentation

## 2023-02-08 DIAGNOSIS — M25562 Pain in left knee: Secondary | ICD-10-CM | POA: Insufficient documentation

## 2023-02-08 DIAGNOSIS — M899 Disorder of bone, unspecified: Secondary | ICD-10-CM | POA: Insufficient documentation

## 2023-02-08 NOTE — Progress Notes (Signed)
Safety precautions to be maintained throughout the outpatient stay will include: orient to surroundings, keep bed in low position, maintain call bell within reach at all times, provide assistance with transfer out of bed and ambulation.  

## 2023-02-08 NOTE — Patient Instructions (Signed)
______________________________________________________________________    Procedure instructions  Stop blood-thinners  Do not eat or drink fluids (other than water) for 6 hours before your procedure  No water for 2 hours before your procedure  Take your blood pressure medicine with a sip of water  Arrive 30 minutes before your appointment  If sedation is planned, bring suitable driver. Pennie Banter, Benedetto Goad, & public transportation are NOT APPROVED)  Carefully read the "Preparing for your procedure" detailed instructions  If you have questions call us at [phone]  ______________________________________________________________________      ______________________________________________________________________    Preparing for your procedure  Appointments: If you think you may not be able to keep your appointment, call 24-48 hours in advance to cancel. We need time to make it available to others.  During your procedure appointment there will be: No Prescription Refills. No disability issues to discussed. No medication changes or discussions.  Instructions: Food intake: Avoid eating anything solid for at least 8 hours prior to your procedure. Clear liquid intake: You may take clear liquids such as water up to 2 hours prior to your procedure. (No carbonated drinks. No soda.) Transportation: Unless otherwise stated by your physician, bring a driver. (Driver cannot be a Market researcher, Pharmacist, community, or any other form of public transportation.) Morning Medicines: Except for blood thinners, take all of your other morning medications with a sip of water. Make sure to take your heart and blood pressure medicines. If your blood pressure's lower number is above 100, the case will be rescheduled. Blood thinners: Make sure to stop your blood thinners as instructed.  If you take a blood thinner, but were not instructed to stop it, call our office [phone] and ask to talk to a nurse. Not stopping a blood  thinner prior to certain procedures could lead to serious complications. Diabetics on insulin: Notify the staff so that you can be scheduled 1st case in the morning. If your diabetes requires high dose insulin, take only  of your normal insulin dose the morning of the procedure and notify the staff that you have done so. Preventing infections: Shower with an antibacterial soap the morning of your procedure.  Build-up your immune system: Take 1000 mg of Vitamin C with every meal (3 times a day) the day prior to your procedure. Antibiotics: Inform the nursing staff if you are taking any antibiotics or if you have any conditions that may require antibiotics prior to procedures. (Example: recent joint implants)   Pregnancy: If you are pregnant make sure to notify the nursing staff. Not doing so may result in injury to the fetus, including death.  Sickness: If you have a cold, fever, or any active infections, call and cancel or reschedule your procedure. Receiving steroids while having an infection may result in complications. Arrival: You must be in the facility at least 30 minutes prior to your scheduled procedure. Tardiness: Your scheduled time is also the cutoff time. If you do not arrive at least 15 minutes prior to your procedure, you will be rescheduled.  Children: Do not bring any children with you. Make arrangements to keep them home. Dress appropriately: There is always a possibility that your clothing may get soiled. Avoid long dresses. Valuables: Do not bring any jewelry or valuables.  Reasons to call and reschedule or cancel your procedure: (Following these recommendations will minimize the risk of a serious complication.) Surgeries: Avoid having procedures within 2 weeks of any surgery. (Avoid for 2 weeks before or after any surgery). Flu Shots:  Avoid having procedures within 2 weeks of a flu shots or . (Avoid for 2 weeks before or after immunizations). Barium: Avoid having a procedure  within 7-10 days after having had a radiological study involving the use of radiological contrast. (Myelograms, Barium swallow or enema study). Heart attacks: Avoid any elective procedures or surgeries for the initial 6 months after a "Myocardial Infarction" (Heart Attack). Blood thinners: It is imperative that you stop these medications before procedures. Let us know if you if you take any blood thinner.  Infection: Avoid procedures during or within two weeks of an infection (including chest colds or gastrointestinal problems). Symptoms associated with infections include: Localized redness, fever, chills, night sweats or profuse sweating, burning sensation when voiding, cough, congestion, stuffiness, runny nose, sore throat, diarrhea, nausea, vomiting, cold or Flu symptoms, recent or current infections. It is specially important if the infection is over the area that we intend to treat. Heart and lung problems: Symptoms that may suggest an active cardiopulmonary problem include: cough, chest pain, breathing difficulties or shortness of breath, dizziness, ankle swelling, uncontrolled high or unusually low blood pressure, and/or palpitations. If you are experiencing any of these symptoms, cancel your procedure and contact your primary care physician for an evaluation.  Remember:  Regular Business hours are:  Monday to Thursday 8:00 AM to 4:00 PM  Provider's Schedule: Delano Metz, MD:  Procedure days: Tuesday and Thursday 7:30 AM to 4:00 PM  Edward Jolly, MD:  Procedure days: Monday and Wednesday 7:30 AM to 4:00 PM Last  Updated: 11/15/2022 ______________________________________________________________________      ______________________________________________________________________    General Risks and Possible Complications  Patient Responsibilities: It is important that you read this as it is part of your informed consent. It is our duty to inform you of the risks and possible  complications associated with treatments offered to you. It is your responsibility as a patient to read this and to ask questions about anything that is not clear or that you believe was not covered in this document.  Patient's Rights: You have the right to refuse treatment. You also have the right to change your mind, even after initially having agreed to have the treatment done. However, under this last option, if you wait until the last second to change your mind, you may be charged for the materials used up to that point.  Introduction: Medicine is not an Visual merchandiser. Everything in Medicine, including the lack of treatment(s), carries the potential for danger, harm, or loss (which is by definition: Risk). In Medicine, a complication is a secondary problem, condition, or disease that can aggravate an already existing one. All treatments carry the risk of possible complications. The fact that a side effects or complications occurs, does not imply that the treatment was conducted incorrectly. It must be clearly understood that these can happen even when everything is done following the highest safety standards.  No treatment: You can choose not to proceed with the proposed treatment alternative. The "PRO(s)" would include: avoiding the risk of complications associated with the therapy. The "CON(s)" would include: not getting any of the treatment benefits. These benefits fall under one of three categories: diagnostic; therapeutic; and/or palliative. Diagnostic benefits include: getting information which can ultimately lead to improvement of the disease or symptom(s). Therapeutic benefits are those associated with the successful treatment of the disease. Finally, palliative benefits are those related to the decrease of the primary symptoms, without necessarily curing the condition (example: decreasing the pain from a flare-up  of a chronic condition, such as incurable terminal cancer).  General Risks and  Complications: These are associated to most interventional treatments. They can occur alone, or in combination. They fall under one of the following six (6) categories: no benefit or worsening of symptoms; bleeding; infection; nerve damage; allergic reactions; and/or death. No benefits or worsening of symptoms: In Medicine there are no guarantees, only probabilities. No healthcare provider can ever guarantee that a medical treatment will work, they can only state the probability that it may. Furthermore, there is always the possibility that the condition may worsen, either directly, or indirectly, as a consequence of the treatment. Bleeding: This is more common if the patient is taking a blood thinner, either prescription or over the counter (example: Goody Powders, Fish oil, Aspirin, Garlic, etc.), or if suffering a condition associated with impaired coagulation (example: Hemophilia, cirrhosis of the liver, low platelet counts, etc.). However, even if you do not have one on these, it can still happen. If you have any of these conditions, or take one of these drugs, make sure to notify your treating physician. Infection: This is more common in patients with a compromised immune system, either due to disease (example: diabetes, cancer, human immunodeficiency virus [HIV], etc.), or due to medications or treatments (example: therapies used to treat cancer and rheumatological diseases). However, even if you do not have one on these, it can still happen. If you have any of these conditions, or take one of these drugs, make sure to notify your treating physician. Nerve Damage: This is more common when the treatment is an invasive one, but it can also happen with the use of medications, such as those used in the treatment of cancer. The damage can occur to small secondary nerves, or to large primary ones, such as those in the spinal cord and brain. This damage may be temporary or permanent and it may lead to  impairments that can range from temporary numbness to permanent paralysis and/or brain death. Allergic Reactions: Any time a substance or material comes in contact with our body, there is the possibility of an allergic reaction. These can range from a mild skin rash (contact dermatitis) to a severe systemic reaction (anaphylactic reaction), which can result in death. Death: In general, any medical intervention can result in death, most of the time due to an unforeseen complication. ______________________________________________________________________      ______________________________________________________________________    New Patients  Welcome to [facility].   Initial Visit The first or initial visit consists of an evaluation only.   Interventional pain management.  We offer therapies other than opioid controlled substances to manage chronic pain. These include, but are not limited to, diagnostic, therapeutic, and palliative specialized injection therapies (i.e.: Epidural Steroids, Facet Blocks, etc.). We specialize in a variety of nerve blocks as well as radiofrequency treatments. We offer pain implant evaluations and trials, as well as follow up management. In addition we also provide a variety joint injections, including Viscosupplementation (AKA: Gel Therapy).  Prescription Pain Medication. We specialize in alternatives to opioids. We can provide evaluations and recommendations for/of pharmacologic therapies based on CDC Guidelines.  We no longer take patients for long-term medication management. We will not be taking over your pain medications.  ______________________________________________________________________      ______________________________________________________________________    Patient Information update  To: All of our patients.  Re: Name change.  It has been made official that our current  name, "[facility]"   will soon be changed to "[facility]".   The purpose of this change is to eliminate any confusion created by the concept of our practice being a "Medication Management Pain Clinic". In the past this has led to the misconception that we treat pain primarily by the use of prescription medications.  Nothing can be farther from the truth.   Understanding PAIN MANAGEMENT: To further understand what our practice does, you first have to understand that "Pain Management" is a subspecialty that requires additional training once a physician has completed their specialty training, which can be in either Anesthesia, Neurology, Psychiatry, or Physical Medicine and Rehabilitation (PMR). Each one of these contributes to the final approach taken by each physician to the management of their patient's pain. To be a "Pain Management Specialist" you must have first completed one of the specialty trainings below.  Anesthesiologists - trained in clinical pharmacology and interventional techniques such as nerve blockade and regional as well as central neuroanatomy. They are trained to block pain before, during, and after surgical interventions.  Neurologists - trained in the diagnosis and pharmacological treatment of complex neurological conditions, such as Multiple Sclerosis, Parkinson's, spinal cord injuries, and other systemic conditions that may be associated with symptoms that may include but are not limited to pain. They tend to rely primarily on the treatment of chronic pain using prescription medications.  Psychiatrist - trained in conditions affecting the psychosocial wellbeing of patients including but not limited to depression, anxiety, schizophrenia, personality disorders, addiction, and other substance use disorders that may be associated with chronic pain. They tend to rely primarily  on the treatment of chronic pain using prescription medications.   Physical Medicine and Rehabilitation (PMR) physicians, also known as physiatrists - trained to treat a wide variety of medical conditions affecting the brain, spinal cord, nerves, bones, joints, ligaments, muscles, and tendons. Their training is primarily aimed at treating patients that have suffered injuries that have caused severe physical impairment. Their training is primarily aimed at the physical therapy and rehabilitation of those patients. They may also work alongside orthopedic surgeons or neurosurgeons using their expertise in assisting surgical patients to recover after their surgeries.  INTERVENTIONAL PAIN MANAGEMENT is sub-subspecialty of Pain Management.  Our physicians are Board-certified in Anesthesia, Pain Management, and Interventional Pain Management.  This meaning that not only have they been trained and Board-certified in their specialty of Anesthesia, and subspecialty of Pain Management, but they have also received further training in the sub-subspecialty of Interventional Pain Management, in order to become Board-certified as INTERVENTIONAL PAIN MANAGEMENT SPECIALIST.    Mission: Our goal is to use our skills in  INTERVENTIONAL PAIN MANAGEMENT as alternatives to the chronic use of prescription opioid medications for the treatment of pain. To make this more clear, we have changed our name to reflect what we do and offer. We will continue to offer medication management assessment and recommendations, but we will not be taking over any patient's medication management.  ______________________________________________________________________      ______________________________________________________________________    Muscle Spasms & Cramps  Cause(s):  Most common - vitamin and/or electrolyte (calcium, potassium, sodium, etc.) deficiencies. Post procedure - steroids (injected, oral, or inhaled) can make your  kidneys excrete (loose) electrolytes. Most of the time this will not cause any symptoms however, if you happen to be borderline low on your electrolytes, it may temporarily triggering cramps & spasms.  Possible triggers: Sweating -  causes loss of electrolytes thru the skin. Steroids - causes loss of electrolytes thru the urine.  Treatment: (over-the-counter)  Gatorade (or any other electrolyte-replenishing drink) - Take 1, 8 oz glass with each meal (3 times a day). Mechanism of action: Replenishes lost electrolytes. Magnesium 400 to 500 mg - Take 1 tablet twice a day (one with breakfast and one at bedtime). If you have kidney disease talk to your primary care physician before taking any Magnesium. Mechanism of action: Magnesium is a natural muscle relaxant. Tonic Water with quinine - Take 1, 8 oz glass before bedtime.  Mechanism of action: Quinine is used to treat spasms.  Last Update: 10/06/2022  ______________________________________________________________________

## 2023-02-11 LAB — COMPLIANCE DRUG ANALYSIS, UR

## 2023-02-14 ENCOUNTER — Encounter (INDEPENDENT_AMBULATORY_CARE_PROVIDER_SITE_OTHER): Payer: Self-pay | Admitting: Nurse Practitioner

## 2023-02-15 ENCOUNTER — Other Ambulatory Visit: Payer: Self-pay | Admitting: Primary Care

## 2023-02-15 DIAGNOSIS — R600 Localized edema: Secondary | ICD-10-CM

## 2023-02-15 DIAGNOSIS — M79672 Pain in left foot: Secondary | ICD-10-CM

## 2023-02-20 LAB — C-REACTIVE PROTEIN: CRP: 2 mg/L (ref 0–10)

## 2023-02-20 LAB — COMP. METABOLIC PANEL (12)
AST: 20 [IU]/L (ref 0–40)
Albumin: 4.4 g/dL (ref 3.8–4.9)
Alkaline Phosphatase: 107 [IU]/L (ref 44–121)
BUN/Creatinine Ratio: 17 (ref 9–23)
BUN: 14 mg/dL (ref 6–24)
Bilirubin Total: 0.3 mg/dL (ref 0.0–1.2)
Calcium: 10.4 mg/dL — ABNORMAL HIGH (ref 8.7–10.2)
Chloride: 97 mmol/L (ref 96–106)
Creatinine, Ser: 0.82 mg/dL (ref 0.57–1.00)
Globulin, Total: 3.3 g/dL (ref 1.5–4.5)
Glucose: 192 mg/dL — ABNORMAL HIGH (ref 70–99)
Potassium: 4.1 mmol/L (ref 3.5–5.2)
Sodium: 139 mmol/L (ref 134–144)
Total Protein: 7.7 g/dL (ref 6.0–8.5)
eGFR: 85 mL/min/{1.73_m2} (ref >59)

## 2023-02-20 LAB — SEDIMENTATION RATE: Sed Rate: 68 mm/h — ABNORMAL HIGH (ref 0–40)

## 2023-02-20 LAB — VITAMIN B12: Vitamin B-12: 1674 pg/mL — ABNORMAL HIGH (ref 232–1245)

## 2023-02-20 LAB — MAGNESIUM: Magnesium: 2.1 mg/dL (ref 1.6–2.3)

## 2023-02-20 LAB — 25-HYDROXY VITAMIN D LCMS D2+D3
25-Hydroxy, Vitamin D-2: <1 ng/mL
25-Hydroxy, Vitamin D-3: 32 ng/mL
25-Hydroxy, Vitamin D: 32 ng/mL

## 2023-02-20 NOTE — Addendum Note (Signed)
Addended by: Delano Metz A on: 02/20/2023 05:26 PM   Modules accepted: Orders

## 2023-03-01 NOTE — Patient Instructions (Signed)

## 2023-03-01 NOTE — Progress Notes (Unsigned)
PROVIDER NOTE: Interpretation of information contained herein should be left to medically-trained personnel. Specific patient instructions are provided elsewhere under "Patient Instructions" section of medical record. This document was created in part using STT-dictation technology, any transcriptional errors that may result from this process are unintentional.  Patient: Kristy Cunningham Type: Established DOB: 03/22/1971 MRN: [id number] PCP: Sandrea Hughs, NP  Service: Procedure DOS: 03/02/2023 Setting: Ambulatory Location: Ambulatory outpatient facility Delivery: Face-to-face Provider: Oswaldo Done, MD Specialty: Interventional Pain Management Specialty designation: 09 Location: Outpatient facility Ref. Prov.: Sandrea Hughs, NP       Interventional Therapy   Type: Lumbar epidural steroid injection (LESI) (interlaminar) #1    Laterality: Left   Level:  L3-4 Level.  Imaging: Fluoroscopic guidance Spinal ([id number]) Anesthesia: Local anesthesia (1-2% Lidocaine) Anxiolysis: IV Versed 2.0 mg Sedation: Moderate Sedation None required. No Fentanyl administered.         DOS: 03/02/2023  Performed by: Oswaldo Done, MD  Purpose: Diagnostic/Therapeutic Indications: Lumbar radicular pain of intraspinal etiology of more than 4 weeks that has failed to respond to conservative therapy and is severe enough to impact quality of life or function. 1. Lumbar radiculopathy   2. Lumbar spondylosis   3. Protrusion of lumbar intervertebral disc   4. Spasm of muscle of lower back   5. Spondylolisthesis, lumbar region   6. Chronic knee pain (2ry area of Pain) (Left)   7. Chronic foot pain (1ry area of Pain) (Left)   8. Displacement of lumbar intervertebral disc   9. Abnormal MRI, lumbar spine (11/30/2022) ([facility])    (11/30/2022) LUMBAR MRI FINDINGS: ([facility]) DISC LEVELS: L2-3: Mixed disc protrusion. Rightward eccentric and posterolateral mixed disc component. Mass effect upon  the right L2 nerve root.  L3-4: Mixed disc protrusion. Facet arthropathy. Moderate left foraminal narrowing. Mass effect upon the left L3 nerve root.  L4-5: Spondylotic disc protrusion. Facet arthropathy.  L5-S1: Disc bulge. Peripelvic left renal cysts.   CONCLUSION:  1. Right posterolateral disc protrusion at L2-3 with right L2 nerve root mass effect.  2.  Left posterolateral disc protrusion at L3-4 with a left L3 nerve root mass effect.  3. 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   NAS-11 Pain score:   Pre-procedure: 5 /10   Post-procedure: 0-No pain/10      Position / Prep / Materials:  Position: Prone w/ head of the table raised (slight reverse trendelenburg) to facilitate breathing.  Prep solution: ChloraPrep (2% chlorhexidine gluconate and 70% isopropyl alcohol) Prep Area: Entire Posterior Lumbar Region from lower scapular tip down to mid buttocks area and from flank to flank. Materials:  Tray: Epidural tray Needle(s):  Type: Epidural needle (Tuohy) Gauge (G):  17 Length: Regular (3.5-in) Qty: 1  Discussed the use of AI scribe software for clinical note transcription with the patient, who gave verbal consent to proceed.  History of Present Illness   The patient presented with persistent back and leg pain, suspected to be due to radicular compression. They had previously undergone an unspecified procedure at an urgent orthopedic clinic, which they reported as being painful. In an attempt to alleviate the pain, an interlaminar lumbar epidural steroid injection at the L3, 4 level on the left side was planned.  The patient did not report any other health concerns or symptoms during the consultation. They were cooperative and appeared to understand the procedure and its potential outcomes.      H&P (Pre-op Assessment):  Kristy Cunningham is a 52 y.o. (year old), female patient, seen today for  interventional treatment. She  has no past surgical history on file. Kristy Cunningham has a current  medication list which includes the following prescription(s): azithromycin, bisacodyl, cephalexin, colace, dicyclomine, fluconazole, fluticasone, glucose blood, glucose blood, unifine pentips, lantus solostar, losartan-hydrochlorothiazide, miralax, omeprazole, sertraline, sulfamethoxazole-trimethoprim, tizanidine, and trulicity, and the following Facility-Administered Medications: fentanyl. Her primarily concern today is the Back Pain (lower)  Initial Vital Signs:  Pulse/HCG Rate: 93ECG Heart Rate: 98 (NSR) Temp: (!) 97.2 F (36.2 C) Resp: 16 BP: (!) 184/77 SpO2: 97 %  BMI: Estimated body mass index is 30.12 kg/m as calculated from the following:   Height as of this encounter: 5' (1.524 m).   Weight as of this encounter: 154 lb 3.2 oz (69.9 kg).  Risk Assessment: Allergies: Reviewed. She is allergic to gnp budesonide nasal spray [budesonide].  Allergy Precautions: None required Coagulopathies: Reviewed. None identified.  Blood-thinner therapy: None at this time Active Infection(s): Reviewed. None identified. Ms. Stultz is afebrile  Site Confirmation: Kristy Cunningham was asked to confirm the procedure and laterality before marking the site Procedure checklist: Completed Consent: Before the procedure and under the influence of no sedative(s), amnesic(s), or anxiolytics, the patient was informed of the treatment options, risks and possible complications. To fulfill our ethical and legal obligations, as recommended by the American Medical Association's Code of Ethics, I have informed the patient of my clinical impression; the nature and purpose of the treatment or procedure; the risks, benefits, and possible complications of the intervention; the alternatives, including doing nothing; the risk(s) and benefit(s) of the alternative treatment(s) or procedure(s); and the risk(s) and benefit(s) of doing nothing. The patient was provided information about the general risks and possible complications  associated with the procedure. These may include, but are not limited to: failure to achieve desired goals, infection, bleeding, organ or nerve damage, allergic reactions, paralysis, and death. In addition, the patient was informed of those risks and complications associated to Spine-related procedures, such as failure to decrease pain; infection (i.e.: Meningitis, epidural or intraspinal abscess); bleeding (i.e.: epidural hematoma, subarachnoid hemorrhage, or any other type of intraspinal or peri-dural bleeding); organ or nerve damage (i.e.: Any type of peripheral nerve, nerve root, or spinal cord injury) with subsequent damage to sensory, motor, and/or autonomic systems, resulting in permanent pain, numbness, and/or weakness of one or several areas of the body; allergic reactions; (i.e.: anaphylactic reaction); and/or death. Furthermore, the patient was informed of those risks and complications associated with the medications. These include, but are not limited to: allergic reactions (i.e.: anaphylactic or anaphylactoid reaction(s)); adrenal axis suppression; blood sugar elevation that in diabetics may result in ketoacidosis or comma; water retention that in patients with history of congestive heart failure may result in shortness of breath, pulmonary edema, and decompensation with resultant heart failure; weight gain; swelling or edema; medication-induced neural toxicity; particulate matter embolism and blood vessel occlusion with resultant organ, and/or nervous system infarction; and/or aseptic necrosis of one or more joints. Finally, the patient was informed that Medicine is not an exact science; therefore, there is also the possibility of unforeseen or unpredictable risks and/or possible complications that may result in a catastrophic outcome. The patient indicated having understood very clearly. We have given the patient no guarantees and we have made no promises. Enough time was given to the patient to  ask questions, all of which were answered to the patient's satisfaction. Ms. Bardy has indicated that she wanted to continue with the procedure. Attestation: I, the ordering provider, attest that I have  discussed with the patient the benefits, risks, side-effects, alternatives, likelihood of achieving goals, and potential problems during recovery for the procedure that I have provided informed consent. Date  Time: 03/02/2023  8:03 AM   Pre-Procedure Preparation:  Monitoring: As per clinic protocol. Respiration, ETCO2, SpO2, BP, heart rate and rhythm monitor placed and checked for adequate function Safety Precautions: Patient was assessed for positional comfort and pressure points before starting the procedure. Time-out: I initiated and conducted the "Time-out" before starting the procedure, as per protocol. The patient was asked to participate by confirming the accuracy of the "Time Out" information. Verification of the correct person, site, and procedure were performed and confirmed by me, the nursing staff, and the patient. "Time-out" conducted as per Joint Commission's Universal Protocol (UP.01.01.01). Time: 0847 Start Time: 0847 hrs.  Description/Narrative of Procedure:          Target: Epidural space via interlaminar opening, initially targeting the lower laminar border of the superior vertebral body. Region: Lumbar Approach: Percutaneous paravertebral  Rationale (medical necessity): procedure needed and proper for the diagnosis and/or treatment of the patient's medical symptoms and needs. Procedural Technique Safety Precautions: Aspiration looking for blood return was conducted prior to all injections. At no point did we inject any substances, as a needle was being advanced. No attempts were made at seeking any paresthesias. Safe injection practices and needle disposal techniques used. Medications properly checked for expiration dates. SDV (single dose vial) medications used. Description  of the Procedure: Protocol guidelines were followed. The procedure needle was introduced through the skin, ipsilateral to the reported pain, and advanced to the target area. Bone was contacted and the needle walked caudad, until the lamina was cleared. The epidural space was identified using "loss-of-resistance technique" with 2-3 ml of PF-NaCl (0.9% NSS), in a 5cc LOR glass syringe.  Vitals:   03/02/23 0843 03/02/23 0848 03/02/23 0853 03/02/23 0903  BP: (!) 212/103 (!) 210/94 (!) 182/91 (!) 187/94  Pulse:    99  Resp: 15 12 15 16   Temp:      SpO2: 100% 100% 100% 98%  Weight:      Height:        Start Time: 0847 hrs. End Time: 0853 hrs.  Imaging Guidance (Spinal):          Type of Imaging Technique: Fluoroscopy Guidance (Spinal) Indication(s): Fluoroscopy guidance for needle placement to enhance accuracy in procedures requiring precise needle localization for targeted delivery of medication in or near specific anatomical locations not easily accessible without such real-time imaging assistance. Exposure Time: Please see nurses notes. Contrast: Before injecting any contrast, we confirmed that the patient did not have an allergy to iodine, shellfish, or radiological contrast. Once satisfactory needle placement was completed at the desired level, radiological contrast was injected. Contrast injected under live fluoroscopy. No contrast complications. See chart for type and volume of contrast used. Fluoroscopic Guidance: I was personally present during the use of fluoroscopy. "Tunnel Vision Technique" used to obtain the best possible view of the target area. Parallax error corrected before commencing the procedure. "Direction-depth-direction" technique used to introduce the needle under continuous pulsed fluoroscopy. Once target was reached, antero-posterior, oblique, and lateral fluoroscopic projection used confirm needle placement in all planes. Images permanently stored in EMR. Interpretation: I  personally interpreted the imaging intraoperatively. Adequate needle placement confirmed in multiple planes. Appropriate spread of contrast into desired area was observed. No evidence of afferent or efferent intravascular uptake. No intrathecal or subarachnoid spread observed. Permanent images saved into  the patient's record.  Antibiotic Prophylaxis:   Anti-infectives (From admission, onward)    None      Indication(s): None identified  Post-operative Assessment:  Post-procedure Vital Signs:  Pulse/HCG Rate: 9999 (NSR) Temp: (!) 97.2 F (36.2 C) Resp: 16 BP: (!) 187/94 SpO2: 98 %  EBL: None  Complications: No immediate post-treatment complications observed by team, or reported by patient.  Note: The patient tolerated the entire procedure well. A repeat set of vitals were taken after the procedure and the patient was kept under observation following institutional policy, for this type of procedure. Post-procedural neurological assessment was performed, showing return to baseline, prior to discharge. The patient was provided with post-procedure discharge instructions, including a section on how to identify potential problems. Should any problems arise concerning this procedure, the patient was given instructions to immediately contact us, at any time, without hesitation. In any case, we plan to contact the patient by telephone for a follow-up status report regarding this interventional procedure.  Comments:  No additional relevant information.  Plan of Care (POC)  Assessment and Plan    Radicular Pain due to Lumbar Spinal Compression They present with severe radicular pain in the left leg, likely due to lumbar spinal compression at L3-4 as indicated by MRI. We will perform an interlaminar lumbar epidural steroid injection at the L3-4 level on the left side. This procedure is both diagnostic and therapeutic, with the local anesthetic providing immediate relief and the steroid expected  to reduce inflammation and provide longer-term relief within 4-10 days. Informed consent was obtained after discussing the risks, including potential vasovagal reflex, and benefits. We emphasized the importance of maintaining a pain diary to track treatment effectiveness and differentiate between the effects of the anesthetic and steroid. We informed them about potential systemic effects of the steroid. A follow-up is scheduled in two weeks to assess effectiveness. We provided a pain diary and post-procedure instructions.  General Health Maintenance We advised on the importance of following post-procedure instructions and maintaining a pain diary to ensure accurate assessment of treatment effectiveness. We informed them about potential systemic effects of the steroid and instructed to call if experiencing abnormal symptoms or concerns.  Follow-up A follow-up is scheduled in two weeks.      Orders:  Orders Placed This Encounter  Procedures   Lumbar Epidural Injection    Scheduling Instructions:     Procedure: Interlaminar LESI L3-4     Laterality: Left     Sedation: With Sedation     Timeframe: Today    Order Specific Question:   Where will this procedure be performed?    Answer:   [facility]   DG [facility] C-ARM 1-60 MIN NO REPORT    Intraoperative interpretation by procedural physician at [facility].    Standing Status:   Standing    Number of Occurrences:   1    Order Specific Question:   Reason for exam:    Answer:   Assistance in needle guidance and placement for procedures requiring needle placement in or near specific anatomical locations not easily accessible without such assistance.   Informed Consent Details: Physician/Practitioner Attestation; Transcribe to consent form and obtain patient signature    Note: Always confirm laterality of pain with Ms. Campton, before procedure. Transcribe to consent form and obtain patient signature.    Order Specific  Question:   Physician/Practitioner attestation of informed consent for procedure/surgical case    Answer:   I, the physician/practitioner, attest that  I have discussed with the patient the benefits, risks, side effects, alternatives, likelihood of achieving goals and potential problems during recovery for the procedure that I have provided informed consent.    Order Specific Question:   Procedure    Answer:   Lumbar epidural steroid injection under fluoroscopic guidance    Order Specific Question:   Physician/Practitioner performing the procedure    Answer:   Kierah Goatley A. Laban Emperor, MD    Order Specific Question:   Indication/Reason    Answer:   Low back and/or lower extremity pain secondary to lumbar radiculitis   Provide equipment / supplies at bedside    Procedural tray: Epidural Tray (Disposable  single use) Skin infiltration needle: Regular 1.5-in, 25-G, (x1) Block needle size: Regular standard Catheter: No catheter required    Standing Status:   Standing    Number of Occurrences:   1    Order Specific Question:   Specify    Answer:   Epidural Tray   Chronic Opioid Analgesic:  None MME/day: 0 mg/day   Medications ordered for procedure: Meds ordered this encounter  Medications   iohexol (OMNIPAQUE) 180 MG/ML injection 10 mL    Must be Myelogram-compatible. If not available, you may substitute with a water-soluble, non-ionic, hypoallergenic, myelogram-compatible radiological contrast medium.   lidocaine (XYLOCAINE) 2 % (with pres) injection 400 mg   pentafluoroprop-tetrafluoroeth (GEBAUERS) aerosol   lactated ringers infusion   midazolam (VERSED) 5 MG/5ML injection 0.5-2 mg    Make sure Flumazenil is available in the pyxis when using this medication. If oversedation occurs, administer 0.2 mg IV over 15 sec. If after 45 sec no response, administer 0.2 mg again over 1 min; may repeat at 1 min intervals; not to exceed 4 doses (1 mg)   fentaNYL (SUBLIMAZE) injection 25-50 mcg    Make  sure Narcan is available in the pyxis when using this medication. In the event of respiratory depression (RR< 8/min): Titrate NARCAN (naloxone) in increments of 0.1 to 0.2 mg IV at 2-3 minute intervals, until desired degree of reversal.   sodium chloride flush (NS) 0.9 % injection 2 mL   ropivacaine (PF) 2 mg/mL (0.2%) (NAROPIN) injection 2 mL   triamcinolone acetonide (KENALOG-40) injection 40 mg   Medications administered: We administered iohexol, lidocaine, pentafluoroprop-tetrafluoroeth, lactated ringers, midazolam, sodium chloride flush, ropivacaine (PF) 2 mg/mL (0.2%), and triamcinolone acetonide.  See the medical record for exact dosing, route, and time of administration.  Follow-up plan:   Return in about 2 weeks (around 03/16/2023) for (Face2F), (PPE).       Interventional Therapies  Risk Factors  Considerations  Medical Comorbidities:     Planned  Pending:      Under consideration:   Diagnostic/therapeutic left-sided L3-4 LESI #2    Completed:   Diagnostic/therapeutic left-sided L3-4 LESI x1 (03/02/2023) (5-0/10)   Therapeutic  Palliative (PRN) options:   None established   Completed by other providers:   (01/18/2023) diagnostic left sacroiliac joint injection by [facility] (12/14/2022) lower extremity EMG by [facility]      Recent Visits Date Type Provider Dept  02/08/23 Office Visit Delano Metz, MD [facility]  Showing recent visits within past 90 days and meeting all other requirements Today's Visits Date Type Provider Dept  03/02/23 Procedure visit Delano Metz, MD [facility]  Showing today's visits and meeting all other requirements Future Appointments Date Type Provider Dept  03/15/23 Appointment Delano Metz, MD [facility]  Showing future appointments within next 90 days and meeting  all other requirements  Disposition: Discharge home  Discharge (Date  Time): 03/02/2023; 0912 hrs.   Primary  Care Physician: Sandrea Hughs, NP Location: [facility] Note by: Oswaldo Done, MD (TTS technology used. I apologize for any typographical errors that were not detected and corrected.) Date: 03/02/2023; Time: 9:57 AM  Disclaimer:  Medicine is not an Visual merchandiser. The only guarantee in medicine is that nothing is guaranteed. It is important to note that the decision to proceed with this intervention was based on the information collected from the patient. The Data and conclusions were drawn from the patient's questionnaire, the interview, and the physical examination. Because the information was provided in large part by the patient, it cannot be guaranteed that it has not been purposely or unconsciously manipulated. Every effort has been made to obtain as much relevant data as possible for this evaluation. It is important to note that the conclusions that lead to this procedure are derived in large part from the available data. Always take into account that the treatment will also be dependent on availability of resources and existing treatment guidelines, considered by other Pain Management Practitioners as being common knowledge and practice, at the time of the intervention. For Medico-Legal purposes, it is also important to point out that variation in procedural techniques and pharmacological choices are the acceptable norm. The indications, contraindications, technique, and results of the above procedure should only be interpreted and judged by a Board-Certified Interventional Pain Specialist with extensive familiarity and expertise in the same exact procedure and technique.

## 2023-03-02 ENCOUNTER — Ambulatory Visit: Payer: Medicaid Other | Attending: Pain Medicine | Admitting: Pain Medicine

## 2023-03-02 ENCOUNTER — Ambulatory Visit
Admission: RE | Admit: 2023-03-02 | Discharge: 2023-03-02 | Disposition: A | Discharge status: Home / Self Care | Payer: Medicaid Other | Source: Ambulatory Visit | Attending: Pain Medicine | Admitting: Pain Medicine

## 2023-03-02 ENCOUNTER — Encounter: Payer: Self-pay | Admitting: Pain Medicine

## 2023-03-02 VITALS — BP 187/94 | HR 99 | Temp 97.2°F | Resp 16 | Ht 60.0 in | Wt 154.2 lb

## 2023-03-02 DIAGNOSIS — M47816 Spondylosis without myelopathy or radiculopathy, lumbar region: Secondary | ICD-10-CM | POA: Insufficient documentation

## 2023-03-02 DIAGNOSIS — M25562 Pain in left knee: Secondary | ICD-10-CM | POA: Insufficient documentation

## 2023-03-02 DIAGNOSIS — R937 Abnormal findings on diagnostic imaging of other parts of musculoskeletal system: Secondary | ICD-10-CM | POA: Insufficient documentation

## 2023-03-02 DIAGNOSIS — M5416 Radiculopathy, lumbar region: Secondary | ICD-10-CM | POA: Insufficient documentation

## 2023-03-02 DIAGNOSIS — M5126 Other intervertebral disc displacement, lumbar region: Secondary | ICD-10-CM | POA: Diagnosis present

## 2023-03-02 DIAGNOSIS — M79672 Pain in left foot: Secondary | ICD-10-CM | POA: Diagnosis present

## 2023-03-02 DIAGNOSIS — G8929 Other chronic pain: Secondary | ICD-10-CM | POA: Insufficient documentation

## 2023-03-02 DIAGNOSIS — M4316 Spondylolisthesis, lumbar region: Secondary | ICD-10-CM | POA: Insufficient documentation

## 2023-03-02 DIAGNOSIS — R253 Fasciculation: Secondary | ICD-10-CM | POA: Diagnosis present

## 2023-03-02 DIAGNOSIS — M62830 Muscle spasm of back: Secondary | ICD-10-CM | POA: Insufficient documentation

## 2023-03-02 MED ORDER — MIDAZOLAM HCL 5 MG/5ML IJ SOLN
INTRAMUSCULAR | Status: AC
Start: 2023-03-02 — End: ?
  Filled 2023-03-02: qty 5

## 2023-03-02 MED ORDER — ROPIVACAINE HCL 2 MG/ML IJ SOLN
2.0000 mL | Freq: Once | INTRAMUSCULAR | Status: AC
  Administered 2023-03-02: 2 mL via EPIDURAL

## 2023-03-02 MED ORDER — LIDOCAINE HCL (PF) 2 % IJ SOLN
INTRAMUSCULAR | Status: AC
  Filled 2023-03-02: qty 10

## 2023-03-02 MED ORDER — ROPIVACAINE HCL 2 MG/ML IJ SOLN
INTRAMUSCULAR | Status: AC
  Filled 2023-03-02: qty 20

## 2023-03-02 MED ORDER — TRIAMCINOLONE ACETONIDE 40 MG/ML IJ SUSP
40.0000 mg | Freq: Once | INTRAMUSCULAR | Status: AC
  Administered 2023-03-02: 40 mg

## 2023-03-02 MED ORDER — PENTAFLUOROPROP-TETRAFLUOROETH EX AERO
INHALATION_SPRAY | Freq: Once | CUTANEOUS | Status: AC
Start: 2023-03-02 — End: 2023-03-02
  Administered 2023-03-02: 30 via TOPICAL

## 2023-03-02 MED ORDER — LACTATED RINGERS IV SOLN: Freq: Once | INTRAVENOUS | Status: AC

## 2023-03-02 MED ORDER — IOHEXOL 180 MG/ML  SOLN
10.0000 mL | Freq: Once | INTRAMUSCULAR | Status: AC
Start: 2023-03-02 — End: 2023-03-02
  Administered 2023-03-02: 10 mL via EPIDURAL

## 2023-03-02 MED ORDER — FENTANYL CITRATE (PF) 100 MCG/2ML IJ SOLN
INTRAMUSCULAR | Status: AC
  Filled 2023-03-02: qty 2

## 2023-03-02 MED ORDER — TRIAMCINOLONE ACETONIDE 40 MG/ML IJ SUSP
INTRAMUSCULAR | Status: AC
  Filled 2023-03-02: qty 1

## 2023-03-02 MED ORDER — SODIUM CHLORIDE (PF) 0.9 % IJ SOLN
INTRAMUSCULAR | Status: AC
  Filled 2023-03-02: qty 10

## 2023-03-02 MED ORDER — SODIUM CHLORIDE 0.9% FLUSH
2.0000 mL | Freq: Once | INTRAVENOUS | Status: AC
  Administered 2023-03-02: 2 mL

## 2023-03-02 MED ORDER — LIDOCAINE HCL 2 % IJ SOLN
20.0000 mL | Freq: Once | INTRAMUSCULAR | Status: AC
  Administered 2023-03-02: 100 mg

## 2023-03-02 MED ORDER — IOHEXOL 180 MG/ML  SOLN
INTRAMUSCULAR | Status: AC
  Filled 2023-03-02: qty 10

## 2023-03-02 MED ORDER — FENTANYL CITRATE (PF) 100 MCG/2ML IJ SOLN: 25.0000 ug | INTRAMUSCULAR | Status: DC | PRN

## 2023-03-02 MED ORDER — MIDAZOLAM HCL 5 MG/5ML IJ SOLN
0.5000 mg | Freq: Once | INTRAMUSCULAR | Status: AC
  Administered 2023-03-02: 2 mg via INTRAVENOUS

## 2023-03-02 NOTE — Progress Notes (Signed)
Safety precautions to be maintained throughout the outpatient stay will include: orient to surroundings, keep bed in low position, maintain call bell within reach at all times, provide assistance with transfer out of bed and ambulation.  

## 2023-03-03 ENCOUNTER — Telehealth: Payer: Self-pay

## 2023-03-03 NOTE — Telephone Encounter (Signed)
Called PP no answer. Left message to call if needed.

## 2023-03-14 NOTE — Progress Notes (Unsigned)
PROVIDER NOTE: Information contained herein reflects review and annotations entered in association with encounter. Interpretation of such information and data should be left to medically-trained personnel. Information provided to patient can be located elsewhere in the medical record under "Patient Instructions". Document created using STT-dictation technology, any transcriptional errors that may result from process are unintentional.    Patient: Kristy Cunningham  Service Category: E/M  Provider: Oswaldo Done, MD  DOB: 31-Aug-1970  DOS: 03/15/2023  Referring Provider: Sandrea Hughs, NP  MRN: [id number]  Specialty: Interventional Pain Management  PCP: Sandrea Hughs, NP  Type: Established Patient  Setting: Ambulatory outpatient    Location: Office  Delivery: Face-to-face     HPI  [name], a 52 y.o. year old female, is here today because of her No primary diagnosis found.. Ms. Fiola's primary complain today is No chief complaint on file.  Pertinent problems: Ms. 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-4, L4-5); Low back strain; Low back pain; Chronic LLQ pain; Lumbar spondylosis; Chronic sacroiliac joint pain (Left); Swelling of lower leg; Chronic pain syndrome; Abnormal MRI, lumbar spine (11/30/2022) ([facility]); Chronic foot pain (1ry area of Pain) (Left); Chronic knee pain (2ry area of Pain) (Left); Spasm of muscle of lower back; Muscle spasms of lower extremity (Left); Fasciculations of muscle (hamstring) (Left); Allodynia (foot) (Left); Swelling of foot (Left); Lumbar intervertebral disc protrusion (Right: L2-3) (Left: L3-4); and Displacement of lumbar intervertebral disc on their pertinent problem list. Pain Assessment: Severity of   is reported as a  /10. Location:    / . Onset:  . Quality:  . Timing:  . Modifying factor(s):  Marland Kitchen Vitals:  vitals were not taken for this visit.  BMI: Estimated body  mass index is 30.12 kg/m as calculated from the following:   Height as of 03/02/23: 5' (1.524 m).   Weight as of 03/02/23: 154 lb 3.2 oz (69.9 kg). Last encounter: 02/08/2023. Last procedure: 03/02/2023.  Reason for encounter: post-procedure evaluation and assessment. ***  Discussed the use of AI scribe software for clinical note transcription with the patient, who gave verbal consent to proceed.  History of Present Illness          Post-procedure evaluation    Type: Lumbar epidural steroid injection (LESI) (interlaminar) #1    Laterality: Left   Level:  L3-4 Level.  Imaging: Fluoroscopic guidance Spinal ([id number]) Anesthesia: Local anesthesia (1-2% Lidocaine) Anxiolysis: IV Versed 2.0 mg Sedation: Moderate Sedation None required. No Fentanyl administered.         DOS: 03/02/2023  Performed by: Oswaldo Done, MD  Purpose: Diagnostic/Therapeutic Indications: Lumbar radicular pain of intraspinal etiology of more than 4 weeks that has failed to respond to conservative therapy and is severe enough to impact quality of life or function. 1. Lumbar radiculopathy   2. Lumbar spondylosis   3. Protrusion of lumbar intervertebral disc   4. Spasm of muscle of lower back   5. Spondylolisthesis, lumbar region   6. Chronic knee pain (2ry area of Pain) (Left)   7. Chronic foot pain (1ry area of Pain) (Left)   8. Displacement of lumbar intervertebral disc   9. Abnormal MRI, lumbar spine (11/30/2022) ([facility])    (11/30/2022) LUMBAR MRI FINDINGS: ([facility]) DISC LEVELS: L2-3: Mixed disc protrusion. Rightward eccentric and posterolateral mixed disc component. Mass effect upon the right L2 nerve root.  L3-4: Mixed disc protrusion. Facet arthropathy. Moderate left foraminal narrowing. Mass effect upon the left L3 nerve  root.  L4-5: Spondylotic disc protrusion. Facet arthropathy.  L5-S1: Disc bulge. Peripelvic left renal cysts.   CONCLUSION:  1. Right posterolateral disc  protrusion at L2-3 with right L2 nerve root mass effect.  2.  Left posterolateral disc protrusion at L3-4 with a left L3 nerve root mass effect.  3. 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   NAS-11 Pain score:   Pre-procedure: 5 /10   Post-procedure: 0-No pain/10     Effectiveness:  Initial hour after procedure:   ***. Subsequent 4-6 hours post-procedure:   ***. Analgesia past initial 6 hours:   ***. Ongoing improvement:  Analgesic:  *** Function:    ***    ROM:    ***      Pharmacotherapy Assessment  Analgesic:  None MME/day: 0 mg/day   Monitoring: [place] PMP: PDMP reviewed during this encounter.       Pharmacotherapy: No side-effects or adverse reactions reported. Compliance: No problems identified. Effectiveness: Clinically acceptable.  No notes on file  No results found for: "CBDTHCR" No results found for: "D8THCCBX" No results found for: "D9THCCBX"  UDS:  Summary  Date Value Ref Range Status  02/08/2023 Note  Final    Comment:    ==================================================================== Compliance Drug Analysis, Ur ==================================================================== Specimen Alert Not Detected result may be consistent with the time of last use noted for this medication. AS NEEDED (Oxycodone, Tramadol) ==================================================================== Test                             Result       Flag       Units  Drug Present and Declared for Prescription Verification   Desmethylcyclobenzaprine       PRESENT      EXPECTED    Desmethylcyclobenzaprine is an expected metabolite of    cyclobenzaprine.    Sertraline                     PRESENT      EXPECTED   Desmethylsertraline            PRESENT      EXPECTED    Desmethylsertraline is an expected metabolite of sertraline.  Drug Absent but Declared for Prescription Verification   Oxycodone                      Not Detected UNEXPECTED ng/mg creat   Tramadol                        Not Detected UNEXPECTED ng/mg creat   Gabapentin                     Not Detected UNEXPECTED   Tizanidine                     Not Detected UNEXPECTED    Tizanidine, as indicated in the declared medication list, is not    always detected even when used as directed.    Acetaminophen                  Not Detected UNEXPECTED    Acetaminophen, as indicated in the declared medication list, is not    always detected even when used as directed.    Diclofenac                     Not Detected UNEXPECTED    Diclofenac,  as indicated in the declared medication list, is not    always detected even when used as directed.    Naproxen                       Not Detected UNEXPECTED ==================================================================== Test                      Result    Flag   Units      Ref Range   Creatinine              43               mg/dL      >=16 ==================================================================== Declared Medications:  The flagging and interpretation on this report are based on the  following declared medications.  Unexpected results may arise from  inaccuracies in the declared medications.   **Note: The testing scope of this panel includes these medications:   Cyclobenzaprine (Flexeril)  Gabapentin (Neurontin)  Naproxen (Naprosyn)  Oxycodone  Sertraline (Zoloft)  Tramadol (Ultram)   **Note: The testing scope of this panel does not include small to  moderate amounts of these reported medications:   Acetaminophen  Diclofenac (Voltaren)  Tizanidine (Zanaflex)   **Note: The testing scope of this panel does not include the  following reported medications:   Azithromycin (Zithromax)  Bisacodyl  Cephalexin  Clotrimazole (Lotrimin)  Dicyclomine (Bentyl)  Docusate (Colace)  Dulaglutide (Trulicity)  Empagliflozin (Jardiance)  Fluconazole (Diflucan)  Fluticasone (Flonase)  Hydrochlorothiazide (Hyzaar)  Insulin (Lantus)  Losartan (Cozaar)   Losartan (Hyzaar)  Meloxicam (Mobic)  Metformin (Janumet)  Omeprazole (Prilosec)  Ondansetron (Zofran)  Polyethylene Glycol  Prednisone (Deltasone)  Sitagliptin (Janumet)  Sulfamethoxazole (Bactrim)  Supplement  Topical  Trimethoprim (Bactrim) ==================================================================== For clinical consultation, please call [phone]. ====================================================================       ROS  Constitutional: Denies any fever or chills Gastrointestinal: No reported hemesis, hematochezia, vomiting, or acute GI distress Musculoskeletal: Denies any acute onset joint swelling, redness, loss of ROM, or weakness Neurological: No reported episodes of acute onset apraxia, aphasia, dysarthria, agnosia, amnesia, paralysis, loss of coordination, or loss of consciousness  Medication Review  Dulaglutide, Glucose Blood, Insulin Pen Needle, azithromycin, bisacodyl, cephALEXin, dicyclomine, docusate sodium, fluconazole, fluticasone, glucose blood, insulin glargine, losartan-hydrochlorothiazide, omeprazole, polyethylene glycol powder, sertraline, sulfamethoxazole-trimethoprim, and tiZANidine  History Review  Allergy: Ms. Mirenda is allergic to gnp budesonide nasal spray [budesonide]. Drug: Ms. Hauptman  reports no history of drug use. Alcohol:  reports no history of alcohol use. Tobacco:  reports that she has never smoked. She has never used smokeless tobacco. Social: Ms. Mathieu  reports that she has never smoked. She has never used smokeless tobacco. She reports that she does not drink alcohol and does not use drugs. Medical:  has a past medical history of Diabetes mellitus without complication (HCC). Surgical: Ms. Lingad  has no past surgical history on file. Family: family history includes Breast cancer in her paternal grandmother.  Laboratory Chemistry Profile   Renal Lab Results  Component Value Date   BUN 14 02/08/2023    CREATININE 0.82 02/08/2023   BCR 17 02/08/2023   GFRNONAA >60 04/02/2022    Hepatic Lab Results  Component Value Date   AST 20 02/08/2023   ALT 28 04/02/2022   ALBUMIN 4.4 02/08/2023   ALKPHOS 107 02/08/2023   LIPASE 76 (H) 04/02/2022    Electrolytes Lab Results  Component Value Date   NA 139 02/08/2023   K  4.1 02/08/2023   CL 97 02/08/2023   CALCIUM 10.4 (H) 02/08/2023   MG 2.1 02/08/2023    Bone Lab Results  Component Value Date   25OHVITD1 32 02/08/2023   25OHVITD2 <1.0 02/08/2023   25OHVITD3 32 02/08/2023    Inflammation (CRP: Acute Phase) (ESR: Chronic Phase) Lab Results  Component Value Date   CRP 2 02/08/2023   ESRSEDRATE 68 (H) 02/08/2023   LATICACIDVEN 1.8 02/27/2021         Note: Above Lab results reviewed.  Recent Imaging Review  DG PAIN CLINIC C-ARM 1-60 MIN NO REPORT Fluoro was used, but no Radiologist interpretation will be provided.  Please refer to "NOTES" tab for provider progress note. Note: Reviewed        Physical Exam  General appearance: Well nourished, well developed, and well hydrated. In no apparent acute distress Mental status: Alert, oriented x 3 (person, place, & time)       Respiratory: No evidence of acute respiratory distress Eyes: PERLA Vitals: LMP 05/11/2016 (Approximate)  BMI: Estimated body mass index is 30.12 kg/m as calculated from the following:   Height as of 03/02/23: 5' (1.524 m).   Weight as of 03/02/23: 154 lb 3.2 oz (69.9 kg). Ideal: Ideal body weight: 45.5 kg (100 lb 4.9 oz) Adjusted ideal body weight: 55.3 kg (121 lb 13.9 oz)  Assessment   Diagnosis Status  No diagnosis found. Controlled Controlled Controlled   Updated Problems: No problems updated.  Plan of Care  Problem-specific:  Assessment and Plan            Ms. RIDHA CASSANO has a current medication list which includes the following long-term medication(s): dicyclomine, fluticasone, lantus solostar, losartan-hydrochlorothiazide, omeprazole,  and sertraline.  Pharmacotherapy (Medications Ordered): No orders of the defined types were placed in this encounter.  Orders:  No orders of the defined types were placed in this encounter.  Follow-up plan:   No follow-ups on file.      Interventional Therapies  Risk Factors  Considerations  Medical Comorbidities:     Planned  Pending:      Under consideration:   Diagnostic/therapeutic left-sided L3-4 LESI #2    Completed:   Diagnostic/therapeutic left-sided L3-4 LESI x1 (03/02/2023) (5-0/10)   Therapeutic  Palliative (PRN) options:   None established   Completed by other providers:   (01/18/2023) diagnostic left sacroiliac joint injection by [facility] (12/14/2022) lower extremity EMG by [facility]      Recent Visits Date Type Provider Dept  03/02/23 Procedure visit Delano Metz, MD [facility]  02/08/23 Office Visit Delano Metz, MD [facility]  Showing recent visits within past 90 days and meeting all other requirements Future Appointments Date Type Provider Dept  03/15/23 Appointment Delano Metz, MD [facility]  Showing future appointments within next 90 days and meeting all other requirements  I discussed the assessment and treatment plan with the patient. The patient was provided an opportunity to ask questions and all were answered. The patient agreed with the plan and demonstrated an understanding of the instructions.  Patient advised to call back or seek an in-person evaluation if the symptoms or condition worsens.  Duration of encounter: *** minutes.  Total time on encounter, as per AMA guidelines included both the face-to-face and non-face-to-face time personally spent by the physician and/or other qualified health care professional(s) on the day of the encounter (includes time in activities that require the physician or other qualified health care professional and does not include time  in activities  normally performed by clinical staff). Physician's time may include the following activities when performed: Preparing to see the patient (e.g., pre-charting review of records, searching for previously ordered imaging, lab work, and nerve conduction tests) Review of prior analgesic pharmacotherapies. Reviewing PMP Interpreting ordered tests (e.g., lab work, imaging, nerve conduction tests) Performing post-procedure evaluations, including interpretation of diagnostic procedures Obtaining and/or reviewing separately obtained history Performing a medically appropriate examination and/or evaluation Counseling and educating the patient/family/caregiver Ordering medications, tests, or procedures Referring and communicating with other health care professionals (when not separately reported) Documenting clinical information in the electronic or other health record Independently interpreting results (not separately reported) and communicating results to the patient/ family/caregiver Care coordination (not separately reported)  Note by: Oswaldo Done, MD Date: 03/15/2023; Time: 9:15 AM

## 2023-03-15 ENCOUNTER — Encounter: Payer: Self-pay | Admitting: Pain Medicine

## 2023-03-15 ENCOUNTER — Ambulatory Visit: Payer: Medicaid Other | Attending: Pain Medicine | Admitting: Pain Medicine

## 2023-03-15 VITALS — BP 174/89 | HR 113 | Temp 97.9°F | Resp 16 | Ht 60.0 in | Wt 154.0 lb

## 2023-03-15 DIAGNOSIS — M5416 Radiculopathy, lumbar region: Secondary | ICD-10-CM | POA: Diagnosis present

## 2023-03-15 DIAGNOSIS — M79605 Pain in left leg: Secondary | ICD-10-CM | POA: Insufficient documentation

## 2023-03-15 DIAGNOSIS — M5442 Lumbago with sciatica, left side: Secondary | ICD-10-CM | POA: Diagnosis present

## 2023-03-15 DIAGNOSIS — G8929 Other chronic pain: Secondary | ICD-10-CM

## 2023-03-15 DIAGNOSIS — Z09 Encounter for follow-up examination after completed treatment for conditions other than malignant neoplasm: Secondary | ICD-10-CM | POA: Diagnosis present

## 2023-03-15 DIAGNOSIS — R253 Fasciculation: Secondary | ICD-10-CM

## 2023-03-15 DIAGNOSIS — M62838 Other muscle spasm: Secondary | ICD-10-CM

## 2023-03-15 DIAGNOSIS — M62830 Muscle spasm of back: Secondary | ICD-10-CM | POA: Diagnosis present

## 2023-03-15 DIAGNOSIS — M25562 Pain in left knee: Secondary | ICD-10-CM | POA: Diagnosis present

## 2023-03-15 DIAGNOSIS — M79672 Pain in left foot: Secondary | ICD-10-CM | POA: Diagnosis not present

## 2023-03-15 DIAGNOSIS — M5126 Other intervertebral disc displacement, lumbar region: Secondary | ICD-10-CM

## 2023-03-15 DIAGNOSIS — R937 Abnormal findings on diagnostic imaging of other parts of musculoskeletal system: Secondary | ICD-10-CM | POA: Diagnosis present

## 2023-03-15 NOTE — Patient Instructions (Addendum)
 ______________________________________________________________________    Muscle Spasms & Cramps  Cause(s):  Most common - vitamin and/or electrolyte (calcium, potassium, sodium, etc.) deficiencies. Post procedure - steroids (injected, oral, or inhaled) can make your kidneys excrete (loose) electrolytes. Most of the time this will not cause any symptoms however, if you happen to be borderline low on your electrolytes, it may temporarily triggering cramps & spasms.  Possible triggers: Sweating - causes loss of electrolytes thru the skin. Steroids - causes loss of electrolytes thru the urine.  Treatment: (over-the-counter)  Gatorade (or any other electrolyte-replenishing drink) - Take 1, 8 oz glass with each meal (3 times a day). Mechanism of action: Replenishes lost electrolytes. Magnesium 400 to 500 mg - Take 1 tablet twice a day (one with breakfast and one at bedtime). If you have kidney disease talk to your primary care physician before taking any Magnesium. Mechanism of action: Magnesium is a natural muscle relaxant. Tonic Water with quinine - Take 1, 8 oz glass before bedtime.  Mechanism of action: Quinine is used to treat spasms.  Last Update: 10/06/2022  ______________________________________________________________________      ______________________________________________________________________    Procedure instructions  Stop blood-thinners  Do not eat or drink fluids (other than water) for 6 hours before your procedure  No water for 2 hours before your procedure  Take your blood pressure medicine with a sip of water  Arrive 30 minutes before your appointment  If sedation is planned, bring suitable driver. Pennie Banter, Benedetto Goad, & public transportation are NOT APPROVED)  Carefully read the "Preparing for your procedure" detailed instructions  If you have questions call us at [phone]  Procedure appointments are for procedures only. NO medication refills or new problem  evaluations.   ______________________________________________________________________      ______________________________________________________________________    Preparing for your procedure  Appointments: If you think you may not be able to keep your appointment, call 24-48 hours in advance to cancel. We need time to make it available to others.  Procedure visits are for procedures only. During your procedure appointment there will be: NO Prescription Refills*. NO medication changes or discussions*. NO discussion of disability issues*. NO unrelated pain problem evaluations*. NO evaluations to order other pain procedures*. *These will be addressed at a separate and distinct evaluation encounter on the provider's evaluation schedule and not during procedure days.  Instructions: Food intake: Avoid eating anything solid for at least 8 hours prior to your procedure. Clear liquid intake: You may take clear liquids such as water up to 2 hours prior to your procedure. (No carbonated drinks. No soda.) Transportation: Unless otherwise stated by your physician, bring a driver. (Driver cannot be a Market researcher, Pharmacist, community, or any other form of public transportation.) Morning Medicines: Except for blood thinners, take all of your other morning medications with a sip of water. Make sure to take your heart and blood pressure medicines. If your blood pressure's lower number is above 100, the case will be rescheduled. Blood thinners: Make sure to stop your blood thinners as instructed.  If you take a blood thinner, but were not instructed to stop it, call our office [phone] and ask to talk to a nurse. Not stopping a blood thinner prior to certain procedures could lead to serious complications. Diabetics on insulin: Notify the staff so that you can be scheduled 1st case in the morning. If your diabetes requires high dose insulin, take only  of your normal insulin dose the morning of the procedure and notify  the staff that  you have done so. Preventing infections: Shower with an antibacterial soap the morning of your procedure.  Build-up your immune system: Take 1000 mg of Vitamin C with every meal (3 times a day) the day prior to your procedure. Antibiotics: Inform the nursing staff if you are taking any antibiotics or if you have any conditions that may require antibiotics prior to procedures. (Example: recent joint implants)   Pregnancy: If you are pregnant make sure to notify the nursing staff. Not doing so may result in injury to the fetus, including death.  Sickness: If you have a cold, fever, or any active infections, call and cancel or reschedule your procedure. Receiving steroids while having an infection may result in complications. Arrival: You must be in the facility at least 30 minutes prior to your scheduled procedure. Tardiness: Your scheduled time is also the cutoff time. If you do not arrive at least 15 minutes prior to your procedure, you will be rescheduled.  Children: Do not bring any children with you. Make arrangements to keep them home. Dress appropriately: There is always a possibility that your clothing may get soiled. Avoid long dresses. Valuables: Do not bring any jewelry or valuables.  Reasons to call and reschedule or cancel your procedure: (Following these recommendations will minimize the risk of a serious complication.) Surgeries: Avoid having procedures within 2 weeks of any surgery. (Avoid for 2 weeks before or after any surgery). Flu Shots: Avoid having procedures within 2 weeks of a flu shots or . (Avoid for 2 weeks before or after immunizations). Barium: Avoid having a procedure within 7-10 days after having had a radiological study involving the use of radiological contrast. (Myelograms, Barium swallow or enema study). Heart attacks: Avoid any elective procedures or surgeries for the initial 6 months after a "Myocardial Infarction" (Heart Attack). Blood thinners: It  is imperative that you stop these medications before procedures. Let us know if you if you take any blood thinner.  Infection: Avoid procedures during or within two weeks of an infection (including chest colds or gastrointestinal problems). Symptoms associated with infections include: Localized redness, fever, chills, night sweats or profuse sweating, burning sensation when voiding, cough, congestion, stuffiness, runny nose, sore throat, diarrhea, nausea, vomiting, cold or Flu symptoms, recent or current infections. It is specially important if the infection is over the area that we intend to treat. Heart and lung problems: Symptoms that may suggest an active cardiopulmonary problem include: cough, chest pain, breathing difficulties or shortness of breath, dizziness, ankle swelling, uncontrolled high or unusually low blood pressure, and/or palpitations. If you are experiencing any of these symptoms, cancel your procedure and contact your primary care physician for an evaluation.  Remember:  Regular Business hours are:  Monday to Thursday 8:00 AM to 4:00 PM  Provider's Schedule: Delano Metz, MD:  Procedure days: Tuesday and Thursday 7:30 AM to 4:00 PM  Edward Jolly, MD:  Procedure days: Monday and Wednesday 7:30 AM to 4:00 PM Last  Updated: 03/07/2023 ______________________________________________________________________      ______________________________________________________________________    General Risks and Possible Complications  Patient Responsibilities: It is important that you read this as it is part of your informed consent. It is our duty to inform you of the risks and possible complications associated with treatments offered to you. It is your responsibility as a patient to read this and to ask questions about anything that is not clear or that you believe was not covered in this document.  Patient's Rights: You have the right  to refuse treatment. You also have the right  to change your mind, even after initially having agreed to have the treatment done. However, under this last option, if you wait until the last second to change your mind, you may be charged for the materials used up to that point.  Introduction: Medicine is not an Visual merchandiser. Everything in Medicine, including the lack of treatment(s), carries the potential for danger, harm, or loss (which is by definition: Risk). In Medicine, a complication is a secondary problem, condition, or disease that can aggravate an already existing one. All treatments carry the risk of possible complications. The fact that a side effects or complications occurs, does not imply that the treatment was conducted incorrectly. It must be clearly understood that these can happen even when everything is done following the highest safety standards.  No treatment: You can choose not to proceed with the proposed treatment alternative. The "PRO(s)" would include: avoiding the risk of complications associated with the therapy. The "CON(s)" would include: not getting any of the treatment benefits. These benefits fall under one of three categories: diagnostic; therapeutic; and/or palliative. Diagnostic benefits include: getting information which can ultimately lead to improvement of the disease or symptom(s). Therapeutic benefits are those associated with the successful treatment of the disease. Finally, palliative benefits are those related to the decrease of the primary symptoms, without necessarily curing the condition (example: decreasing the pain from a flare-up of a chronic condition, such as incurable terminal cancer).  General Risks and Complications: These are associated to most interventional treatments. They can occur alone, or in combination. They fall under one of the following six (6) categories: no benefit or worsening of symptoms; bleeding; infection; nerve damage; allergic reactions; and/or death. No benefits or worsening of  symptoms: In Medicine there are no guarantees, only probabilities. No healthcare provider can ever guarantee that a medical treatment will work, they can only state the probability that it may. Furthermore, there is always the possibility that the condition may worsen, either directly, or indirectly, as a consequence of the treatment. Bleeding: This is more common if the patient is taking a blood thinner, either prescription or over the counter (example: Goody Powders, Fish oil, Aspirin, Garlic, etc.), or if suffering a condition associated with impaired coagulation (example: Hemophilia, cirrhosis of the liver, low platelet counts, etc.). However, even if you do not have one on these, it can still happen. If you have any of these conditions, or take one of these drugs, make sure to notify your treating physician. Infection: This is more common in patients with a compromised immune system, either due to disease (example: diabetes, cancer, human immunodeficiency virus [HIV], etc.), or due to medications or treatments (example: therapies used to treat cancer and rheumatological diseases). However, even if you do not have one on these, it can still happen. If you have any of these conditions, or take one of these drugs, make sure to notify your treating physician. Nerve Damage: This is more common when the treatment is an invasive one, but it can also happen with the use of medications, such as those used in the treatment of cancer. The damage can occur to small secondary nerves, or to large primary ones, such as those in the spinal cord and brain. This damage may be temporary or permanent and it may lead to impairments that can range from temporary numbness to permanent paralysis and/or brain death. Allergic Reactions: Any time a substance or material comes in contact with our  body, there is the possibility of an allergic reaction. These can range from a mild skin rash (contact dermatitis) to a severe systemic  reaction (anaphylactic reaction), which can result in death. Death: In general, any medical intervention can result in death, most of the time due to an unforeseen complication. ______________________________________________________________________

## 2023-03-16 ENCOUNTER — Ambulatory Visit: Payer: Medicaid Other | Admitting: Pain Medicine

## 2023-04-03 DIAGNOSIS — R600 Localized edema: Secondary | ICD-10-CM | POA: Insufficient documentation

## 2023-04-05 NOTE — Progress Notes (Signed)
 PROVIDER NOTE: Interpretation of information contained herein should be left to medically-trained personnel. Specific patient instructions are provided elsewhere under Patient Instructions section of medical record. This document was created in part using STT-dictation technology, any transcriptional errors that may result from this process are unintentional.  Patient: Kristy Cunningham Type: Established DOB: 1970-11-25 MRN: [id number] PCP: Era Raisin, NP  Service: Procedure DOS: 04/06/2023 Setting: Ambulatory Location: Ambulatory outpatient facility Delivery: Face-to-face Provider: Eric DELENA Como, MD Specialty: Interventional Pain Management Specialty designation: 09 Location: Outpatient facility Ref. Prov.: Como Eric, MD       Interventional Therapy   Type: Lumbar epidural steroid injection (LESI) (interlaminar) #2    Laterality: Left   Level:  L5-S1 Level.  Imaging: Fluoroscopic guidance Spinal ([id number]) Anesthesia: Local anesthesia (1-2% Lidocaine ) Anxiolysis: IV Versed  3.0 mg Sedation: Moderate Sedation Fentanyl  1 mL (50 mcg) DOS: 04/06/2023  Performed by: Eric DELENA Como, MD  Purpose: Diagnostic/Therapeutic Indications: Lumbar radicular pain of intraspinal etiology of more than 4 weeks that has failed to respond to conservative therapy and is severe enough to impact quality of life or function. 1. Chronic lower extremity pain (Left)   2. Chronic low back pain (Midline) (4th area of Pain) w/ sciatica (Left)   3. Displacement of lumbar intervertebral disc   4. Lumbar intervertebral disc protrusion (Right: L2-3) (Left: L3-4)   5. Lumbar radiculopathy (Left)   6. Lumbar spondylosis   7. Spondylolisthesis, lumbar region   8. Abnormal MRI, lumbar spine (11/30/2022) ([facility])   9. Chronic knee pain (2ry area of Pain) (Left)   10. Chronic foot pain (1ry area of Pain) (Left)    NAS-11 Pain score:   Pre-procedure: 7 /10   Post-procedure: 0-No pain/10       Position / Prep / Materials:  Position: Prone w/ head of the table raised (slight reverse trendelenburg) to facilitate breathing.  Prep solution: ChloraPrep (2% chlorhexidine gluconate and 70% isopropyl alcohol) Prep Area: Entire Posterior Lumbar Region from lower scapular tip down to mid buttocks area and from flank to flank. Materials:  Tray: Epidural tray Needle(s):  Type: Epidural needle (Tuohy) Gauge (G):  17 Length: Regular (3.5-in) Qty: 1   H&P (Pre-op Assessment):  Ms. Colquhoun is a 53 y.o. (year old), female patient, seen today for interventional treatment. She  has no past surgical history on file. Ms. Menter has a current medication list which includes the following prescription(s): bisacodyl, colace, dicyclomine, fluconazole, fluticasone, glucose blood, glucose blood, unifine pentips, lantus solostar, losartan -hydrochlorothiazide, miralax, omeprazole, sertraline, tizanidine, and trulicity, and the following Facility-Administered Medications: fentanyl . Her primarily concern today is the Back Pain, Leg Pain, and Foot Pain (Bilateral but L>R)  Initial Vital Signs:  Pulse/HCG Rate: (!) 106ECG Heart Rate: (!) 104 (st) Temp: 97.7 F (36.5 C) Resp: 18 BP: (!) 186/95 SpO2: 98 %  BMI: Estimated body mass index is 30.08 kg/m as calculated from the following:   Height as of this encounter: 5' (1.524 m).   Weight as of this encounter: 154 lb (69.9 kg).  Risk Assessment: Allergies: Reviewed. She is allergic to gnp budesonide nasal spray [budesonide].  Allergy Precautions: None required Coagulopathies: Reviewed. None identified.  Blood-thinner therapy: None at this time Active Infection(s): Reviewed. None identified. Ms. Kilbride is afebrile  Site Confirmation: Ms. Capobianco was asked to confirm the procedure and laterality before marking the site Procedure checklist: Completed Consent: Before the procedure and under the influence of no sedative(s), amnesic(s), or  anxiolytics, the patient was informed of the treatment options, risks  and possible complications. To fulfill our ethical and legal obligations, as recommended by the American Medical Association's Code of Ethics, I have informed the patient of my clinical impression; the nature and purpose of the treatment or procedure; the risks, benefits, and possible complications of the intervention; the alternatives, including doing nothing; the risk(s) and benefit(s) of the alternative treatment(s) or procedure(s); and the risk(s) and benefit(s) of doing nothing. The patient was provided information about the general risks and possible complications associated with the procedure. These may include, but are not limited to: failure to achieve desired goals, infection, bleeding, organ or nerve damage, allergic reactions, paralysis, and death. In addition, the patient was informed of those risks and complications associated to Spine-related procedures, such as failure to decrease pain; infection (i.e.: Meningitis, epidural or intraspinal abscess); bleeding (i.e.: epidural hematoma, subarachnoid hemorrhage, or any other type of intraspinal or peri-dural bleeding); organ or nerve damage (i.e.: Any type of peripheral nerve, nerve root, or spinal cord injury) with subsequent damage to sensory, motor, and/or autonomic systems, resulting in permanent pain, numbness, and/or weakness of one or several areas of the body; allergic reactions; (i.e.: anaphylactic reaction); and/or death. Furthermore, the patient was informed of those risks and complications associated with the medications. These include, but are not limited to: allergic reactions (i.e.: anaphylactic or anaphylactoid reaction(s)); adrenal axis suppression; blood sugar elevation that in diabetics may result in ketoacidosis or comma; water retention that in patients with history of congestive heart failure may result in shortness of breath, pulmonary edema, and decompensation  with resultant heart failure; weight gain; swelling or edema; medication-induced neural toxicity; particulate matter embolism and blood vessel occlusion with resultant organ, and/or nervous system infarction; and/or aseptic necrosis of one or more joints. Finally, the patient was informed that Medicine is not an exact science; therefore, there is also the possibility of unforeseen or unpredictable risks and/or possible complications that may result in a catastrophic outcome. The patient indicated having understood very clearly. We have given the patient no guarantees and we have made no promises. Enough time was given to the patient to ask questions, all of which were answered to the patient's satisfaction. Ms. Wunschel has indicated that she wanted to continue with the procedure. Attestation: I, the ordering provider, attest that I have discussed with the patient the benefits, risks, side-effects, alternatives, likelihood of achieving goals, and potential problems during recovery for the procedure that I have provided informed consent. Date  Time: 04/06/2023  8:06 AM   Pre-Procedure Preparation:  Monitoring: As per clinic protocol. Respiration, ETCO2, SpO2, BP, heart rate and rhythm monitor placed and checked for adequate function Safety Precautions: Patient was assessed for positional comfort and pressure points before starting the procedure. Time-out: I initiated and conducted the Time-out before starting the procedure, as per protocol. The patient was asked to participate by confirming the accuracy of the Time Out information. Verification of the correct person, site, and procedure were performed and confirmed by me, the nursing staff, and the patient. Time-out conducted as per Joint Commission's Universal Protocol (UP.01.01.01). Time: 0910 Start Time: 0910 hrs.  Description/Narrative of Procedure:          Target: Epidural space via interlaminar opening, initially targeting the lower laminar  border of the superior vertebral body. Region: Lumbar Approach: Percutaneous paravertebral  Rationale (medical necessity): procedure needed and proper for the diagnosis and/or treatment of the patient's medical symptoms and needs. Procedural Technique Safety Precautions: Aspiration looking for blood return was conducted prior to all  injections. At no point did we inject any substances, as a needle was being advanced. No attempts were made at seeking any paresthesias. Safe injection practices and needle disposal techniques used. Medications properly checked for expiration dates. SDV (single dose vial) medications used. Description of the Procedure: Protocol guidelines were followed. The procedure needle was introduced through the skin, ipsilateral to the reported pain, and advanced to the target area. Bone was contacted and the needle walked caudad, until the lamina was cleared. The epidural space was identified using "loss-of-resistance technique" with 2-3 ml of PF-NaCl (0.9% NSS), in a 5cc LOR glass syringe.  Vitals:   04/06/23 0913 04/06/23 0918 04/06/23 0928 04/06/23 0936  BP: (!) 157/80 (!) 197/91 (!) 112/100 118/78  Pulse:      Resp: 17 15 16 16   Temp:      TempSrc:      SpO2: 100% 100% 96% 97%  Weight:      Height:        Start Time: 0910 hrs. End Time: 0918 hrs.  Imaging Guidance (Spinal):          Type of Imaging Technique: Fluoroscopy Guidance (Spinal) Indication(s): Fluoroscopy guidance for needle placement to enhance accuracy in procedures requiring precise needle localization for targeted delivery of medication in or near specific anatomical locations not easily accessible without such real-time imaging assistance. Exposure Time: Please see nurses notes. Contrast: Before injecting any contrast, we confirmed that the patient did not have an allergy to iodine, shellfish, or radiological contrast. Once satisfactory needle placement was completed at the desired level, radiological  contrast was injected. Contrast injected under live fluoroscopy. No contrast complications. See chart for type and volume of contrast used. Fluoroscopic Guidance: I was personally present during the use of fluoroscopy. Tunnel Vision Technique used to obtain the best possible view of the target area. Parallax error corrected before commencing the procedure. Direction-depth-direction technique used to introduce the needle under continuous pulsed fluoroscopy. Once target was reached, antero-posterior, oblique, and lateral fluoroscopic projection used confirm needle placement in all planes. Images permanently stored in EMR. Interpretation: I personally interpreted the imaging intraoperatively. Adequate needle placement confirmed in multiple planes. Appropriate spread of contrast into desired area was observed. No evidence of afferent or efferent intravascular uptake. No intrathecal or subarachnoid spread observed. Permanent images saved into the patient's record.  Antibiotic Prophylaxis:   Anti-infectives (From admission, onward)    None      Indication(s): None identified  Post-operative Assessment:  Post-procedure Vital Signs:  Pulse/HCG Rate: (!) 10696 Temp: 97.7 F (36.5 C) Resp: 16 BP: 118/78 SpO2: 97 %  EBL: None  Complications: No immediate post-treatment complications observed by team, or reported by patient.  Note: The patient tolerated the entire procedure well. A repeat set of vitals were taken after the procedure and the patient was kept under observation following institutional policy, for this type of procedure. Post-procedural neurological assessment was performed, showing return to baseline, prior to discharge. The patient was provided with post-procedure discharge instructions, including a section on how to identify potential problems. Should any problems arise concerning this procedure, the patient was given instructions to immediately contact us , at any time, without  hesitation. In any case, we plan to contact the patient by telephone for a follow-up status report regarding this interventional procedure.  Comments:  No additional relevant information.  Plan of Care (POC)  Orders:  Orders Placed This Encounter  Procedures   Lumbar Epidural Injection    Scheduling Instructions:  Procedure: Interlaminar LESI L5-S1     Laterality: Left     Sedation: With Sedation     Timeframe: Today    Where will this procedure be performed?:   [facility]   [facility] C-ARM 1-60 MIN NO REPORT    Intraoperative interpretation by procedural physician at [facility].    Standing Status:   Standing    Number of Occurrences:   1    Reason for exam::   Assistance in needle guidance and placement for procedures requiring needle placement in or near specific anatomical locations not easily accessible without such assistance.   Informed Consent Details: Physician/Practitioner Attestation; Transcribe to consent form and obtain patient signature    Note: Always confirm laterality of pain with Ms. Jarchow, before procedure. Transcribe to consent form and obtain patient signature.    Physician/Practitioner attestation of informed consent for procedure/surgical case:   I, the physician/practitioner, attest that I have discussed with the patient the benefits, risks, side effects, alternatives, likelihood of achieving goals and potential problems during recovery for the procedure that I have provided informed consent.    Procedure:   Lumbar epidural steroid injection under fluoroscopic guidance    Physician/Practitioner performing the procedure:   Bekki Tavenner A. Tanya, MD    Indication/Reason:   Low back and/or lower extremity pain secondary to lumbar radiculitis   Provide equipment / supplies at bedside    Procedural tray: Epidural Tray (Disposable  single use) Skin infiltration needle: Regular 1.5-in, 25-G, (x1) Block needle size: Regular  standard Catheter: No catheter required    Standing Status:   Standing    Number of Occurrences:   1    Specify:   Epidural Tray   Saline lock IV    Have LR [phone] mL available and administer at 125 mL/hr if patient becomes hypotensive.    Standing Status:   Standing    Number of Occurrences:   1   Chronic Opioid Analgesic:   None MME/day: 0 mg/day   Medications ordered for procedure: Meds ordered this encounter  Medications   iohexol  (OMNIPAQUE ) 180 MG/ML injection 10 mL    Must be Myelogram-compatible. If not available, you may substitute with a water-soluble, non-ionic, hypoallergenic, myelogram-compatible radiological contrast medium.   lidocaine  (XYLOCAINE ) 2 % (with pres) injection 400 mg   pentafluoroprop-tetrafluoroeth (GEBAUERS) aerosol   midazolam  (VERSED ) 5 MG/5ML injection 0.5-2 mg    Make sure Flumazenil is available in the pyxis when using this medication. If oversedation occurs, administer 0.2 mg IV over 15 sec. If after 45 sec no response, administer 0.2 mg again over 1 min; may repeat at 1 min intervals; not to exceed 4 doses (1 mg)   fentaNYL  (SUBLIMAZE ) injection 25-50 mcg    Make sure Narcan is available in the pyxis when using this medication. In the event of respiratory depression (RR< 8/min): Titrate NARCAN (naloxone) in increments of 0.1 to 0.2 mg IV at 2-3 minute intervals, until desired degree of reversal.   sodium chloride  flush (NS) 0.9 % injection 2 mL   ropivacaine  (PF) 2 mg/mL (0.2%) (NAROPIN ) injection 2 mL   triamcinolone  acetonide (KENALOG -40) injection 40 mg   Medications administered: We administered iohexol , lidocaine , pentafluoroprop-tetrafluoroeth, midazolam , fentaNYL , sodium chloride  flush, ropivacaine  (PF) 2 mg/mL (0.2%), and triamcinolone  acetonide.  See the medical record for exact dosing, route, and time of administration.  Follow-up plan:   Return in about 2 weeks (around 04/20/2023) for (Face2F), (PPE).       Interventional  Therapies  Risk Factors  Considerations  Medical Comorbidities:     Planned  Pending:   Therapeutic left L3-4 LESI #2    Under consideration:   Diagnostic/therapeutic left-sided L3-4 LESI #2    Completed:   Diagnostic/therapeutic left-sided L3-4 LESI x1 (03/02/2023) (5-0/10) (   Therapeutic  Palliative (PRN) options:   None established   Completed by other providers:   (01/18/2023) diagnostic left sacroiliac joint injection by [facility] (12/14/2022) lower extremity EMG by [facility]      Recent Visits Date Type Provider Dept  03/15/23 Office Visit Tanya Glisson, MD [facility]  03/02/23 Procedure visit Tanya Glisson, MD [facility]  02/08/23 Office Visit Tanya Glisson, MD [facility]  Showing recent visits within past 90 days and meeting all other requirements Today's Visits Date Type Provider Dept  04/06/23 Procedure visit Tanya Glisson, MD [facility]  Showing today's visits and meeting all other requirements Future Appointments Date Type Provider Dept  04/20/23 Appointment Tanya Glisson, MD [facility]  Showing future appointments within next 90 days and meeting all other requirements  Disposition: Discharge home  Discharge (Date  Time): 04/06/2023; 0940 hrs.   Primary Care Physician: Era Raisin, NP Location: [facility] Note by: Glisson DELENA Tanya, MD (TTS technology used. I apologize for any typographical errors that were not detected and corrected.) Date: 04/06/2023; Time: 10:47 AM  Disclaimer:  Medicine is not an visual merchandiser. The only guarantee in medicine is that nothing is guaranteed. It is important to note that the decision to proceed with this intervention was based on the information collected from the patient. The Data and conclusions were drawn from the patient's questionnaire, the interview, and the physical examination. Because the  information was provided in large part by the patient, it cannot be guaranteed that it has not been purposely or unconsciously manipulated. Every effort has been made to obtain as much relevant data as possible for this evaluation. It is important to note that the conclusions that lead to this procedure are derived in large part from the available data. Always take into account that the treatment will also be dependent on availability of resources and existing treatment guidelines, considered by other Pain Management Practitioners as being common knowledge and practice, at the time of the intervention. For Medico-Legal purposes, it is also important to point out that variation in procedural techniques and pharmacological choices are the acceptable norm. The indications, contraindications, technique, and results of the above procedure should only be interpreted and judged by a Board-Certified Interventional Pain Specialist with extensive familiarity and expertise in the same exact procedure and technique.

## 2023-04-06 ENCOUNTER — Encounter: Payer: Self-pay | Admitting: Pain Medicine

## 2023-04-06 ENCOUNTER — Ambulatory Visit: Payer: Medicaid Other | Attending: Pain Medicine | Admitting: Pain Medicine

## 2023-04-06 ENCOUNTER — Ambulatory Visit
Admission: RE | Admit: 2023-04-06 | Discharge: 2023-04-06 | Disposition: A | Discharge status: Home / Self Care | Payer: Medicaid Other | Source: Ambulatory Visit | Attending: Pain Medicine | Admitting: Pain Medicine

## 2023-04-06 VITALS — BP 118/78 | HR 106 | Temp 97.7°F | Resp 16 | Ht 60.0 in | Wt 154.0 lb

## 2023-04-06 DIAGNOSIS — M5442 Lumbago with sciatica, left side: Secondary | ICD-10-CM | POA: Diagnosis present

## 2023-04-06 DIAGNOSIS — M79672 Pain in left foot: Secondary | ICD-10-CM | POA: Insufficient documentation

## 2023-04-06 DIAGNOSIS — M5416 Radiculopathy, lumbar region: Secondary | ICD-10-CM

## 2023-04-06 DIAGNOSIS — G8929 Other chronic pain: Secondary | ICD-10-CM

## 2023-04-06 DIAGNOSIS — M5126 Other intervertebral disc displacement, lumbar region: Secondary | ICD-10-CM

## 2023-04-06 DIAGNOSIS — M25562 Pain in left knee: Secondary | ICD-10-CM | POA: Diagnosis present

## 2023-04-06 DIAGNOSIS — R937 Abnormal findings on diagnostic imaging of other parts of musculoskeletal system: Secondary | ICD-10-CM

## 2023-04-06 DIAGNOSIS — M79605 Pain in left leg: Secondary | ICD-10-CM | POA: Diagnosis present

## 2023-04-06 DIAGNOSIS — M47816 Spondylosis without myelopathy or radiculopathy, lumbar region: Secondary | ICD-10-CM

## 2023-04-06 DIAGNOSIS — M4316 Spondylolisthesis, lumbar region: Secondary | ICD-10-CM | POA: Diagnosis present

## 2023-04-06 MED ORDER — ROPIVACAINE HCL 2 MG/ML IJ SOLN
2.0000 mL | Freq: Once | INTRAMUSCULAR | Status: AC
  Administered 2023-04-06: 2 mL via EPIDURAL

## 2023-04-06 MED ORDER — FENTANYL CITRATE (PF) 100 MCG/2ML IJ SOLN
25.0000 ug | INTRAMUSCULAR | Status: DC | PRN
  Administered 2023-04-06: 50 ug via INTRAVENOUS

## 2023-04-06 MED ORDER — IOHEXOL 180 MG/ML  SOLN
10.0000 mL | Freq: Once | INTRAMUSCULAR | Status: AC
Start: 2023-04-06 — End: 2023-04-06
  Administered 2023-04-06: 10 mL via EPIDURAL

## 2023-04-06 MED ORDER — MIDAZOLAM HCL 5 MG/5ML IJ SOLN
INTRAMUSCULAR | Status: AC
  Filled 2023-04-06: qty 5

## 2023-04-06 MED ORDER — SODIUM CHLORIDE 0.9% FLUSH
2.0000 mL | Freq: Once | INTRAVENOUS | Status: AC
  Administered 2023-04-06: 2 mL

## 2023-04-06 MED ORDER — TRIAMCINOLONE ACETONIDE 40 MG/ML IJ SUSP
INTRAMUSCULAR | Status: AC
  Filled 2023-04-06: qty 1

## 2023-04-06 MED ORDER — SODIUM CHLORIDE (PF) 0.9 % IJ SOLN
INTRAMUSCULAR | Status: AC
  Filled 2023-04-06: qty 10

## 2023-04-06 MED ORDER — MIDAZOLAM HCL 5 MG/5ML IJ SOLN
0.5000 mg | Freq: Once | INTRAMUSCULAR | Status: AC
  Administered 2023-04-06: 3 mg via INTRAVENOUS

## 2023-04-06 MED ORDER — PENTAFLUOROPROP-TETRAFLUOROETH EX AERO
INHALATION_SPRAY | Freq: Once | CUTANEOUS | Status: AC
  Administered 2023-04-06: 30 via TOPICAL

## 2023-04-06 MED ORDER — TRIAMCINOLONE ACETONIDE 40 MG/ML IJ SUSP
40.0000 mg | Freq: Once | INTRAMUSCULAR | Status: AC
  Administered 2023-04-06: 40 mg

## 2023-04-06 MED ORDER — ROPIVACAINE HCL 2 MG/ML IJ SOLN
INTRAMUSCULAR | Status: AC
  Filled 2023-04-06: qty 20

## 2023-04-06 MED ORDER — FENTANYL CITRATE (PF) 100 MCG/2ML IJ SOLN
INTRAMUSCULAR | Status: AC
  Filled 2023-04-06: qty 2

## 2023-04-06 MED ORDER — LIDOCAINE HCL 2 % IJ SOLN
20.0000 mL | Freq: Once | INTRAMUSCULAR | Status: AC
Start: 2023-04-06 — End: 2023-04-06
  Administered 2023-04-06: 100 mg

## 2023-04-06 MED ORDER — LIDOCAINE HCL (PF) 2 % IJ SOLN
INTRAMUSCULAR | Status: AC
  Filled 2023-04-06: qty 10

## 2023-04-06 MED ORDER — IOHEXOL 180 MG/ML  SOLN
INTRAMUSCULAR | Status: AC
Start: 2023-04-06 — End: ?
  Filled 2023-04-06: qty 10

## 2023-04-06 NOTE — Patient Instructions (Addendum)
 My chart username: [email or web address] ______________________________________________________________________    Post-Procedure Discharge Instructions  Instructions: Apply ice:  Purpose: This will minimize any swelling and discomfort after procedure.  When: Day of procedure, as soon as you get home. How: Fill a plastic sandwich bag with crushed ice. Cover it with a small towel and apply to injection site. How long: (15 min on, 15 min off) Apply for 15 minutes then remove x 15 minutes.  Repeat sequence on day of procedure, until you go to bed. Apply heat:  Purpose: To treat any soreness and discomfort from the procedure. When: Starting the next day after the procedure. How: Apply heat to procedure site starting the day following the procedure. How long: May continue to repeat daily, until discomfort goes away. Food intake: Start with clear liquids (like water) and advance to regular food, as tolerated.  Physical activities: Keep activities to a minimum for the first 8 hours after the procedure. After that, then as tolerated. Driving: If you have received any sedation, be responsible and do not drive. You are not allowed to drive for 24 hours after having sedation. Blood thinner: (Applies only to those taking blood thinners) You may restart your blood thinner 6 hours after your procedure. Insulin : (Applies only to Diabetic patients taking insulin ) As soon as you can eat, you may resume your normal dosing schedule. Infection prevention: Keep procedure site clean and dry. Shower daily and clean area with soap and water. Post-procedure Pain Diary: Extremely important that this be done correctly and accurately. Recorded information will be used to determine the next step in treatment. For the purpose of accuracy, follow these rules: Evaluate only the area treated. Do not report or include pain from an untreated area. For the purpose of this evaluation, ignore all other areas of pain, except for the  treated area. After your procedure, avoid taking a long nap and attempting to complete the pain diary after you wake up. Instead, set your alarm clock to go off every hour, on the hour, for the initial 8 hours after the procedure. Document the duration of the numbing medicine, and the relief you are getting from it. Do not go to sleep and attempt to complete it later. It will not be accurate. If you received sedation, it is likely that you were given a medication that may cause amnesia. Because of this, completing the diary at a later time may cause the information to be inaccurate. This information is needed to plan your care. Follow-up appointment: Keep your post-procedure follow-up evaluation appointment after the procedure (usually 2 weeks for most procedures, 6 weeks for radiofrequencies). DO NOT FORGET to bring you pain diary with you.   Expect: (What should I expect to see with my procedure?) From numbing medicine (AKA: Local Anesthetics): Numbness or decrease in pain. You may also experience some weakness, which if present, could last for the duration of the local anesthetic. Onset: Full effect within 15 minutes of injected. Duration: It will depend on the type of local anesthetic used. On the average, 1 to 8 hours.  From steroids (Applies only if steroids were used): Decrease in swelling or inflammation. Once inflammation is improved, relief of the pain will follow. Onset of benefits: Depends on the amount of swelling present. The more swelling, the longer it will take for the benefits to be seen. In some cases, up to 10 days. Duration: Steroids will stay in the system x 2 weeks. Duration of benefits will depend on multiple posibilities including persistent  irritating factors. Side-effects: If present, they may typically last 2 weeks (the duration of the steroids). Frequent: Cramps (if they occur, drink Gatorade and take over-the-counter Magnesium 450-500 mg once to twice a day); water  retention with temporary weight gain; increases in blood sugar; decreased immune system response; increased appetite. Occasional: Facial flushing (red, warm cheeks); mood swings; menstrual changes. Uncommon: Long-term decrease or suppression of natural hormones; bone thinning. (These are more common with higher doses or more frequent use. This is why we prefer that our patients avoid having any injection therapies in other practices.)  Very Rare: Severe mood changes; psychosis; aseptic necrosis. From procedure: Some discomfort is to be expected once the numbing medicine wears off. This should be minimal if ice and heat are applied as instructed.  Call if: (When should I call?) You experience numbness and weakness that gets worse with time, as opposed to wearing off. New onset bowel or bladder incontinence. (Applies only to procedures done in the spine)  Emergency Numbers: Durning business hours (Monday - Thursday, 8:00 AM - 4:00 PM) (Friday, 9:00 AM - 12:00 Noon): [phone] After hours: [phone] NOTE: If you are having a problem and are unable connect with, or to talk to a provider, then go to your nearest urgent care or emergency department. If the problem is serious and urgent, please call 911. ______________________________________________________________________

## 2023-04-07 ENCOUNTER — Telehealth: Payer: Self-pay

## 2023-04-07 NOTE — Telephone Encounter (Signed)
 Post procedure follow up.  Patients states she is doing good. States the feet are still swollen.

## 2023-04-12 ENCOUNTER — Ambulatory Visit: Payer: Medicaid Other | Admitting: Pain Medicine

## 2023-04-20 ENCOUNTER — Ambulatory Visit (HOSPITAL_BASED_OUTPATIENT_CLINIC_OR_DEPARTMENT_OTHER): Payer: Medicaid Other | Admitting: Pain Medicine

## 2023-04-20 DIAGNOSIS — Z09 Encounter for follow-up examination after completed treatment for conditions other than malignant neoplasm: Secondary | ICD-10-CM

## 2023-04-20 DIAGNOSIS — Z91199 Patient's noncompliance with other medical treatment and regimen due to unspecified reason: Secondary | ICD-10-CM

## 2023-04-20 NOTE — Progress Notes (Signed)
(  04/20/2023) NO-SHOW to postprocedure evaluation.

## 2023-04-28 ENCOUNTER — Encounter: Payer: Self-pay | Admitting: *Deleted

## 2023-05-01 ENCOUNTER — Encounter: Payer: Self-pay | Admitting: Cardiology

## 2023-05-01 ENCOUNTER — Ambulatory Visit: Payer: Medicaid Other | Attending: Cardiology | Admitting: Cardiology

## 2023-05-01 ENCOUNTER — Ambulatory Visit: Payer: Medicaid Other

## 2023-05-01 VITALS — BP 152/90 | HR 88 | Ht 60.0 in | Wt 160.6 lb

## 2023-05-01 DIAGNOSIS — I10 Essential (primary) hypertension: Secondary | ICD-10-CM

## 2023-05-01 DIAGNOSIS — R600 Localized edema: Secondary | ICD-10-CM | POA: Diagnosis not present

## 2023-05-01 DIAGNOSIS — I4892 Unspecified atrial flutter: Secondary | ICD-10-CM | POA: Diagnosis not present

## 2023-05-01 MED ORDER — LOSARTAN POTASSIUM 100 MG PO TABS: 100.0000 mg | ORAL_TABLET | Freq: Every day | ORAL | 3 refills | Status: DC

## 2023-05-01 MED ORDER — FUROSEMIDE 20 MG PO TABS: 20.0000 mg | ORAL_TABLET | Freq: Every day | ORAL | 3 refills | Status: DC

## 2023-05-01 NOTE — Patient Instructions (Signed)
Medication Instructions:   STOP losartan-hydrochlorothiazide (HYZAAR) 50-12.5 MG tablet  START Losartan - Take one tablet ( 100mg ) by mouth daily.  START Lasix - Take one tablet ( 20mg ) by mouth daily.   *If you need a refill on your cardiac medications before your next appointment, please call your pharmacy*   Lab Work:  Your provider would like for you to return in 10 days (05/11/2023)  to have the following labs drawn: BMP.   Please go to [facility] [address] ([facility]) [address], [place] [address] You do not need an appointment.  They are open from 8 am- 4:30 pm.  Lunch from 1:00 pm- 2:00 pm You WILL NOT need to be fasting.  If you have labs (blood work) drawn today and your tests are completely normal, you will receive your results only by: MyChart Message (if you have MyChart) OR A paper copy in the mail If you have any lab test that is abnormal or we need to change your treatment, we will call you to review the results.   Testing/Procedures:  Your physician has requested that you have an echocardiogram. Echocardiography is a painless test that uses sound waves to create images of your heart. It provides your doctor with information about the size and shape of your heart and how well your heart's chambers and valves are working. This procedure takes approximately one hour. There are no restrictions for this procedure. Please do NOT wear cologne, perfume, aftershave, or lotions (deodorant is allowed). Please arrive 15 minutes prior to your appointment time.  Please note: We ask at that you not bring children with you during ultrasound (echo/ vascular) testing. Due to room size and safety concerns, children are not allowed in the ultrasound rooms during exams. Our front office staff cannot provide observation of children in our lobby area while testing is being conducted. An adult accompanying a patient to their appointment will only be allowed in the  ultrasound room at the discretion of the ultrasound technician under special circumstances. We apologize for any inconvenience.  Your physician has recommended that you wear a Zio monitor.   This monitor is a medical device that records the heart's electrical activity. Doctors most often use these monitors to diagnose arrhythmias. Arrhythmias are problems with the speed or rhythm of the heartbeat. The monitor is a small device applied to your chest. You can wear one while you do your normal daily activities. While wearing this monitor if you have any symptoms to push the button and record what you felt. Once you have worn this monitor for the period of time provider prescribed (Usually 14 days), you will return the monitor device in the postage paid box. Once it is returned they will download the data collected and provide [place] with a report which the provider will then review and we will call you with those results. Important tips:  Avoid showering during the first 24 hours of wearing the monitor. Avoid excessive sweating to help maximize wear time. Do not submerge the device, no hot tubs, and no swimming pools. Keep any lotions or oils away from the patch. After 24 hours you may shower with the patch on. Take brief showers with your back facing the shower head.  Do not remove patch once it has been placed because that will interrupt data and decrease adhesive wear time. Push the button when you have any symptoms and write down what you were feeling. Once you have completed wearing your monitor,  remove and place into box which has postage paid and place in your outgoing mailbox.  If for some reason you have misplaced your box then call our office and we can provide another box and/or mail it off for you.     Follow-Up: At [facility], you and your health needs are our priority.  As part of our continuing mission to provide you with exceptional heart care, we have created designated  Provider Care Teams.  These Care Teams include your primary Cardiologist (physician) and Advanced Practice Providers (APPs -  Physician Assistants and Nurse Practitioners) who all work together to provide you with the care you need, when you need it.  We recommend signing up for the patient portal called "MyChart".  Sign up information is provided on this After Visit Summary.  MyChart is used to connect with patients for Virtual Visits (Telemedicine).  Patients are able to view lab/test results, encounter notes, upcoming appointments, etc.  Non-urgent messages can be sent to your provider as well.   To learn more about what you can do with MyChart, go to [email or web address].    Your next appointment:   2 month(s)  Provider:   Debbe Odea, MD ONLY

## 2023-05-01 NOTE — Progress Notes (Signed)
Cardiology Office Note:    Date:  05/01/2023   ID:  Kristy Cunningham, DOB 06-01-1970, MRN [id number]  PCP:  Sandrea Hughs, NP   [facility] Providers Cardiologist:  Debbe Odea, MD     Referring MD: Sandrea Hughs, NP   Chief Complaint  Patient presents with   New Patient (Initial Visit)    Referred for cardiac evaluation of Atrial flutter and pedal edema.       History of Present Illness:    Kristy Cunningham is a 53 y.o. female with a hx of hypertension, diabetes, sciatica who presents due to atrial flutter.  Patient saw her primary care physician 04/13/2023 for regular visit, EKG obtained showed atrial flutter, heart rate 76.  Endorse occasional palpitations.  Endorses leg swelling ongoing over the past 6 to several months.  Blood pressure medications recently switched due to palpitations from prior.  Does not remember name of current medication.  osartan and HCTZ 12.5 mg daily was started.  Has been on these BP meds for about 3 weeks now.  Swelling has stayed the same.  She notes increased sensitivity in her lower extremity/foot area sometimes with putting on her socks, also endorses pain with walking.  Denies chest pain.  Denies any personal history of heart disease.  Past Medical History:  Diagnosis Date   Depression    Diabetes mellitus without complication (HCC)    Hypertension    Pedal edema     History reviewed. No pertinent surgical history.  Current Medications: Current Meds  Medication Sig   BISACODYL 5 MG EC tablet Take 5 mg by mouth daily.   COLACE 100 MG capsule Take 100 mg by mouth 2 (two) times daily as needed.   dicyclomine (BENTYL) 20 MG tablet    fluticasone (FLONASE) 50 MCG/ACT nasal spray USE 2 SPRAY(S) IN EACH NOSTRIL ONCE DAILY FOR ALLERGIES   furosemide (LASIX) 20 MG tablet Take 1 tablet (20 mg total) by mouth daily.   Glucose Blood (BLOOD GLUCOSE TEST STRIPS 333 VI) SMARTSIG:Via Meter Daily   glucose blood test strip CHECK  BLOOD SUGAR ONCE DAILY   Insulin Pen Needle (UNIFINE PENTIPS) 31G X 6 MM MISC    LANTUS SOLOSTAR 100 UNIT/ML Solostar Pen Inject into the skin.   losartan (COZAAR) 100 MG tablet Take 1 tablet (100 mg total) by mouth daily.   MIRALAX 17 GM/SCOOP powder Take by mouth.   omeprazole (PRILOSEC) 20 MG capsule    sertraline (ZOLOFT) 100 MG tablet Take 100 mg by mouth daily.   tiZANidine (ZANAFLEX) 4 MG tablet Take 4 mg by mouth 3 (three) times daily.   [DISCONTINUED] losartan-hydrochlorothiazide (HYZAAR) 50-12.5 MG tablet      Allergies:   Gnp budesonide nasal spray [budesonide]   Social History   Socioeconomic History   Marital status: Married    Spouse name: Not on file   Number of children: Not on file   Years of education: Not on file   Highest education level: Not on file  Occupational History   Not on file  Tobacco Use   Smoking status: Never   Smokeless tobacco: Never  Vaping Use   Vaping status: Never Used  Substance and Sexual Activity   Alcohol use: No   Drug use: Never   Sexual activity: Yes  Other Topics Concern   Not on file  Social History Narrative   Not on file   Social Drivers of Health   Financial Resource Strain: Not on file  Food  Insecurity: Not on file  Transportation Needs: Not on file  Physical Activity: Not on file  Stress: Not on file  Social Connections: Not on file     Family History: The patient's family history includes Breast cancer in her paternal grandmother.  ROS:   Please see the history of present illness.     All other systems reviewed and are negative.  EKGs/Labs/Other Studies Reviewed:    The following studies were reviewed today:  EKG Interpretation Date/Time:  Monday May 01 2023 08:41:26 EST Ventricular Rate:  88 PR Interval:  168 QRS Duration:  126 QT Interval:  408 QTC Calculation: 493 R Axis:   -17  Text Interpretation: Normal sinus rhythm Left bundle branch block Confirmed by Debbe Odea ([id number]) on  05/01/2023 8:43:25 AM    Recent Labs: 02/08/2023: BUN 14; Creatinine, Ser 0.82; Magnesium 2.1; Potassium 4.1; Sodium 139  Recent Lipid Panel No results found for: "CHOL", "TRIG", "HDL", "CHOLHDL", "VLDL", "LDLCALC", "LDLDIRECT"   Risk Assessment/Calculations:          Physical Exam:    VS:  BP (!) 152/90 (BP Location: Left Arm, Patient Position: Sitting, Cuff Size: Normal)   Pulse 88   Ht 5' (1.524 m)   Wt 160 lb 9.6 oz (72.8 kg)   LMP 05/11/2016 (Approximate)   SpO2 99%   BMI 31.37 kg/m     Wt Readings from Last 3 Encounters:  05/01/23 160 lb 9.6 oz (72.8 kg)  04/06/23 154 lb (69.9 kg)  03/15/23 154 lb (69.9 kg)     GEN:  Well nourished, well developed in no acute distress HEENT: Normal NECK: No JVD; No carotid bruits CARDIAC: RRR, no murmurs, rubs, gallops RESPIRATORY:  Clear to auscultation without rales, wheezing or rhonchi  ABDOMEN: Soft, non-tender, non-distended MUSCULOSKELETAL:  1+ edema; No deformity  SKIN: Warm and dry NEUROLOGIC:  Alert and oriented x 3 PSYCHIATRIC:  Normal affect   ASSESSMENT:    1. Atrial flutter, unspecified type (HCC)   2. Primary hypertension   3. Bilateral leg edema    PLAN:    In order of problems listed above:  History of atrial flutter, EKG today showing sinus rhythm.  Will reach out to PCP office to get EKG to confirm A-fib/flutter.  Place cardiac monitor.  Obtain echocardiogram. Hypertension, BP elevated.  Stop Hyzaar, start losartan 100 mg daily, Lasix 20 mg daily.  Check BMP in 10 days. Leg edema, start Lasix 20 mg daily, echo as above.  Foot paresthesias could indicate diabetic neuropathy.  Advised to follow-up with PCP for adequate management.  Already seeing spine specialist for sciatica.  Follow-up after cardiac testing     Medication Adjustments/Labs and Tests Ordered: Current medicines are reviewed at length with the patient today.  Concerns regarding medicines are outlined above.  Orders Placed This Encounter   Procedures   Basic Metabolic Panel (BMET)   LONG TERM MONITOR (3-14 DAYS)   EKG 12-Lead   ECHOCARDIOGRAM COMPLETE   Meds ordered this encounter  Medications   losartan (COZAAR) 100 MG tablet    Sig: Take 1 tablet (100 mg total) by mouth daily.    Dispense:  90 tablet    Refill:  3   furosemide (LASIX) 20 MG tablet    Sig: Take 1 tablet (20 mg total) by mouth daily.    Dispense:  90 tablet    Refill:  3    Patient Instructions  Medication Instructions:   STOP losartan-hydrochlorothiazide (HYZAAR) 50-12.5 MG tablet  START Losartan -  Take one tablet ( 100mg ) by mouth daily.  START Lasix - Take one tablet ( 20mg ) by mouth daily.   *If you need a refill on your cardiac medications before your next appointment, please call your pharmacy*   Lab Work:  Your provider would like for you to return in 10 days (05/11/2023)  to have the following labs drawn: BMP.   Please go to [facility] [address] ([facility]) [address], [place] [address] You do not need an appointment.  They are open from 8 am- 4:30 pm.  Lunch from 1:00 pm- 2:00 pm You WILL NOT need to be fasting.  If you have labs (blood work) drawn today and your tests are completely normal, you will receive your results only by: MyChart Message (if you have MyChart) OR A paper copy in the mail If you have any lab test that is abnormal or we need to change your treatment, we will call you to review the results.   Testing/Procedures:  Your physician has requested that you have an echocardiogram. Echocardiography is a painless test that uses sound waves to create images of your heart. It provides your doctor with information about the size and shape of your heart and how well your heart's chambers and valves are working. This procedure takes approximately one hour. There are no restrictions for this procedure. Please do NOT wear cologne, perfume, aftershave, or lotions (deodorant is allowed). Please  arrive 15 minutes prior to your appointment time.  Please note: We ask at that you not bring children with you during ultrasound (echo/ vascular) testing. Due to room size and safety concerns, children are not allowed in the ultrasound rooms during exams. Our front office staff cannot provide observation of children in our lobby area while testing is being conducted. An adult accompanying a patient to their appointment will only be allowed in the ultrasound room at the discretion of the ultrasound technician under special circumstances. We apologize for any inconvenience.  Your physician has recommended that you wear a Zio monitor.   This monitor is a medical device that records the heart's electrical activity. Doctors most often use these monitors to diagnose arrhythmias. Arrhythmias are problems with the speed or rhythm of the heartbeat. The monitor is a small device applied to your chest. You can wear one while you do your normal daily activities. While wearing this monitor if you have any symptoms to push the button and record what you felt. Once you have worn this monitor for the period of time provider prescribed (Usually 14 days), you will return the monitor device in the postage paid box. Once it is returned they will download the data collected and provide [place] with a report which the provider will then review and we will call you with those results. Important tips:  Avoid showering during the first 24 hours of wearing the monitor. Avoid excessive sweating to help maximize wear time. Do not submerge the device, no hot tubs, and no swimming pools. Keep any lotions or oils away from the patch. After 24 hours you may shower with the patch on. Take brief showers with your back facing the shower head.  Do not remove patch once it has been placed because that will interrupt data and decrease adhesive wear time. Push the button when you have any symptoms and write down what you were feeling. Once you  have completed wearing your monitor, remove and place into box which has postage paid and place in your  outgoing mailbox.  If for some reason you have misplaced your box then call our office and we can provide another box and/or mail it off for you.     Follow-Up: At [facility], you and your health needs are our priority.  As part of our continuing mission to provide you with exceptional heart care, we have created designated Provider Care Teams.  These Care Teams include your primary Cardiologist (physician) and Advanced Practice Providers (APPs -  Physician Assistants and Nurse Practitioners) who all work together to provide you with the care you need, when you need it.  We recommend signing up for the patient portal called "MyChart".  Sign up information is provided on this After Visit Summary.  MyChart is used to connect with patients for Virtual Visits (Telemedicine).  Patients are able to view lab/test results, encounter notes, upcoming appointments, etc.  Non-urgent messages can be sent to your provider as well.   To learn more about what you can do with MyChart, go to [email or web address].    Your next appointment:   2 month(s)  Provider:   Debbe Odea, MD ONLY      Signed, Debbe Odea, MD  05/01/2023 10:09 AM    [facility]

## 2023-05-03 ENCOUNTER — Other Ambulatory Visit: Payer: Self-pay | Admitting: Primary Care

## 2023-05-03 DIAGNOSIS — Z1231 Encounter for screening mammogram for malignant neoplasm of breast: Secondary | ICD-10-CM

## 2023-05-04 ENCOUNTER — Ambulatory Visit: Payer: Medicaid Other | Attending: Pain Medicine | Admitting: Pain Medicine

## 2023-05-04 VITALS — BP 162/99 | HR 118 | Temp 96.6°F | Resp 16 | Ht 60.0 in | Wt 160.0 lb

## 2023-05-04 DIAGNOSIS — M7989 Other specified soft tissue disorders: Secondary | ICD-10-CM | POA: Diagnosis present

## 2023-05-04 DIAGNOSIS — R937 Abnormal findings on diagnostic imaging of other parts of musculoskeletal system: Secondary | ICD-10-CM

## 2023-05-04 DIAGNOSIS — M25562 Pain in left knee: Secondary | ICD-10-CM | POA: Insufficient documentation

## 2023-05-04 DIAGNOSIS — M79605 Pain in left leg: Secondary | ICD-10-CM | POA: Diagnosis present

## 2023-05-04 DIAGNOSIS — Z09 Encounter for follow-up examination after completed treatment for conditions other than malignant neoplasm: Secondary | ICD-10-CM

## 2023-05-04 DIAGNOSIS — G8929 Other chronic pain: Secondary | ICD-10-CM

## 2023-05-04 DIAGNOSIS — M5416 Radiculopathy, lumbar region: Secondary | ICD-10-CM

## 2023-05-04 DIAGNOSIS — M5442 Lumbago with sciatica, left side: Secondary | ICD-10-CM | POA: Diagnosis present

## 2023-05-04 NOTE — Progress Notes (Signed)
 PROVIDER NOTE: Information contained herein reflects review and annotations entered in association with encounter. Interpretation of such information and data should be left to medically-trained personnel. Information provided to patient can be located elsewhere in the medical record under Patient Instructions. Document created using STT-dictation technology, any transcriptional errors that may result from process are unintentional.    Patient: Kristy Cunningham  Service Category: E/M  Provider: Eric DELENA Como, MD  DOB: 1971-01-21  DOS: 05/04/2023  Referring Provider: Era Raisin, NP  MRN: [id number]  Specialty: Interventional Pain Management  PCP: Era Raisin, NP  Type: Established Patient  Setting: Ambulatory outpatient    Location: Office  Delivery: Face-to-face     HPI  Kristy Cunningham, a 53 y.o. year old female, is here today because of her Chronic pain of left lower extremity [M79.605, G89.29]. Kristy Cunningham's primary complain today is Foot Pain (bilateral)  Pertinent problems: Ms. Asher has Spondylolisthesis, lumbar region; Lumbar radiculopathy (Left); Arthropathy of lumbar facet joint (Bilateral) (L3-4, L4-5); Low back strain; Low back pain; Chronic LLQ pain; Lumbar spondylosis; Chronic sacroiliac joint pain (Left); Swelling of lower leg; Chronic pain syndrome; Abnormal MRI, lumbar spine (11/30/2022) ([facility]); Chronic foot pain (1ry area of Pain) (Left); Chronic knee pain (2ry area of Pain) (Left); Spasm of muscle of lower back; Muscle spasms of lower extremity (3ry area of Pain) (Left); Fasciculations of muscle (hamstring) (Left); Allodynia (foot) (Left); Swelling of foot (Left); Lumbar intervertebral disc protrusion (Right: L2-3) (Left: L3-4); Displacement of lumbar intervertebral disc; Chronic lower extremity pain (Left); and 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 Pain Assessment: Severity of Chronic pain is  reported as a 5 /10. Location: Foot Left/denies. Onset: More than a month ago. Quality: Throbbing, Tingling (swelling). Timing: Intermittent. Modifying factor(s): elevation. Vitals:  height is 5' (1.524 m) and weight is 160 lb (72.6 kg). Her temporal temperature is 96.6 F (35.9 C) (abnormal). Her blood pressure is 162/99 (abnormal) and her pulse is 118 (abnormal). Her respiration is 16 and oxygen saturation is 97%.  BMI: Estimated body mass index is 31.25 kg/m as calculated from the following:   Height as of this encounter: 5' (1.524 m).   Weight as of this encounter: 160 lb (72.6 kg). Last encounter: 04/20/2023. Last procedure: 04/06/2023.  Reason for encounter: post-procedure evaluation and assessment.  On 04/06/2023 the patient underwent her second left L5-S1 LESI.  She was scheduled to come in on 04/20/2023 for her postprocedure evaluation but she did not show up to that appointment.  Discussed the use of AI scribe software for clinical note transcription with the patient, who gave verbal consent to proceed.  History of Present Illness   Kristy Cunningham is a 53 year old female with lumbar radiculopathy who presents for follow-up after a lumbar epidural steroid injection.  She underwent a left-sided L5 S1 lumbar epidural steroid injection. Initially, she experienced no pain due to the numbing medicine, but the pain returned after the numbing effect wore off. She describes a stinging sensation in her left foot, which was absent on the day of the injection and the following day but returned by January 29th. Additionally, she reports a warm sensation in her left foot and shin, described as feeling warm inside rather than to the touch. The stinging sensation has decreased since the injection, and she has not experienced severe pain that would cause her to cry, as she did before the injection.  She mentions  swelling in her legs and feet, attributing it to other medical issues. She has been  prescribed Lasix  for fluid retention and is undergoing evaluation for potential heart and kidney issues. She is currently taking a blood pressure medication, which was recently adjusted to a higher dose.  She works four hours a day and manages her symptoms by taking tizanidine, a muscle relaxer, as needed, and resting with her feet elevated. Her symptoms are more pronounced on the left side, particularly in the distribution of the L4 and L5 nerves.      Post-procedure evaluation   Type: Lumbar epidural steroid injection (LESI) (interlaminar) #2    Laterality: Left   Level:  L5-S1 Level.  Imaging: Fluoroscopic guidance Spinal ([id number]) Anesthesia: Local anesthesia (1-2% Lidocaine ) Anxiolysis: IV Versed  3.0 mg Sedation: Moderate Sedation Fentanyl  1 mL (50 mcg) DOS: 04/06/2023  Performed by: Eric DELENA Como, MD  Purpose: Diagnostic/Therapeutic Indications: Lumbar radicular pain of intraspinal etiology of more than 4 weeks that has failed to respond to conservative therapy and is severe enough to impact quality of life or function. 1. Chronic lower extremity pain (Left)   2. Chronic low back pain (Midline) (4th area of Pain) w/ sciatica (Left)   3. Displacement of lumbar intervertebral disc   4. Lumbar intervertebral disc protrusion (Right: L2-3) (Left: L3-4)   5. Lumbar radiculopathy (Left)   6. Lumbar spondylosis   7. Spondylolisthesis, lumbar region   8. Abnormal MRI, lumbar spine (11/30/2022) ([facility])   9. Chronic knee pain (2ry area of Pain) (Left)   10. Chronic foot pain (1ry area of Pain) (Left)    NAS-11 Pain score:   Pre-procedure: 7 /10   Post-procedure: 0-No pain/10     Effectiveness:  Initial hour after procedure: 100 %. Subsequent 4-6 hours post-procedure: 100 %. Analgesia past initial 6 hours: 50 %. Ongoing improvement:  Analgesic: The patient initially interpreted the epidural as not having been helpful since the swelling that she is experiencing in the  lower extremities did not go away.  However she is currently being worked up for cardiac problems and it is very likely that the bilateral lower extremity edema is secondary to cardiovascular problem.  Today we had a long conversation and further questioning has revealed that the pain that she was experiencing in the left lower back and knee area has gone away.  The pain that she was having in the S1 distribution has changed from being constant to now being intermittent but she is still having some sensations over the top of her foot and big toe that may be associated with irritation of the L5 nerve root.  At this point we have completed 2 lumbar epidural steroid injections and we will proceed with the third and last to see if by any chance this can help bring this pain further down. Function: Somewhat improved ROM: Somewhat improved  Pharmacotherapy Assessment  Analgesic: No chronic opioid analgesics therapy prescribed by our practice. None MME/day: 0 mg/day   Monitoring: [place] PMP: PDMP reviewed during this encounter.       Pharmacotherapy: No side-effects or adverse reactions reported. Compliance: No problems identified. Effectiveness: Clinically acceptable.  No notes on file  No results found for: CBDTHCR No results found for: D8THCCBX No results found for: D9THCCBX  UDS:  Summary  Date Value Ref Range Status  02/08/2023 Note  Final    Comment:    ==================================================================== Compliance Drug Analysis, Ur ==================================================================== Specimen Alert Not Detected result may be consistent with the time of  last use noted for this medication. AS NEEDED (Oxycodone , Tramadol) ==================================================================== Test                             Result       Flag       Units  Drug Present and Declared for Prescription Verification   Desmethylcyclobenzaprine       PRESENT       EXPECTED    Desmethylcyclobenzaprine is an expected metabolite of    cyclobenzaprine.    Sertraline                     PRESENT      EXPECTED   Desmethylsertraline            PRESENT      EXPECTED    Desmethylsertraline is an expected metabolite of sertraline.  Drug Absent but Declared for Prescription Verification   Oxycodone                       Not Detected UNEXPECTED ng/mg creat   Tramadol                       Not Detected UNEXPECTED ng/mg creat   Gabapentin                     Not Detected UNEXPECTED   Tizanidine                     Not Detected UNEXPECTED    Tizanidine, as indicated in the declared medication list, is not    always detected even when used as directed.    Acetaminophen                   Not Detected UNEXPECTED    Acetaminophen , as indicated in the declared medication list, is not    always detected even when used as directed.    Diclofenac                     Not Detected UNEXPECTED    Diclofenac, as indicated in the declared medication list, is not    always detected even when used as directed.    Naproxen                        Not Detected UNEXPECTED ==================================================================== Test                      Result    Flag   Units      Ref Range   Creatinine              43               mg/dL      >=79 ==================================================================== Declared Medications:  The flagging and interpretation on this report are based on the  following declared medications.  Unexpected results may arise from  inaccuracies in the declared medications.   **Note: The testing scope of this panel includes these medications:   Cyclobenzaprine (Flexeril)  Gabapentin (Neurontin)  Naproxen  (Naprosyn )  Oxycodone   Sertraline (Zoloft)  Tramadol (Ultram)   **Note: The testing scope of this panel does not include small to  moderate amounts of these reported medications:   Acetaminophen   Diclofenac (Voltaren)   Tizanidine (Zanaflex)   **Note: The testing scope of this panel  does not include the  following reported medications:   Azithromycin  (Zithromax )  Bisacodyl  Cephalexin  Clotrimazole (Lotrimin)  Dicyclomine (Bentyl)  Docusate (Colace)  Dulaglutide (Trulicity)  Empagliflozin  (Jardiance )  Fluconazole (Diflucan)  Fluticasone (Flonase)  Hydrochlorothiazide (Hyzaar)  Insulin  (Lantus)  Losartan  (Cozaar )  Losartan  (Hyzaar)  Meloxicam (Mobic)  Metformin (Janumet)  Omeprazole (Prilosec)  Ondansetron  (Zofran )  Polyethylene Glycol  Prednisone  (Deltasone )  Sitagliptin (Janumet)  Sulfamethoxazole (Bactrim)  Supplement  Topical  Trimethoprim (Bactrim) ==================================================================== For clinical consultation, please call [phone]. ====================================================================       ROS  Constitutional: Denies any fever or chills Gastrointestinal: No reported hemesis, hematochezia, vomiting, or acute GI distress Musculoskeletal: Denies any acute onset joint swelling, redness, loss of ROM, or weakness Neurological: No reported episodes of acute onset apraxia, aphasia, dysarthria, agnosia, amnesia, paralysis, loss of coordination, or loss of consciousness  Medication Review  DULoxetine, Glucose Blood, Insulin  Pen Needle, bisacodyl, docusate sodium, fluticasone, furosemide , glucose blood, insulin  glargine, losartan , omeprazole, polyethylene glycol powder, and tiZANidine  History Review  Allergy: Kristy Cunningham has no known allergies. Drug: Kristy Cunningham  reports no history of drug use. Alcohol:  reports no history of alcohol use. Tobacco:  reports that she has never smoked. She has never used smokeless tobacco. Social: Kristy Cunningham  reports that she has never smoked. She has never used smokeless tobacco. She reports that she does not drink alcohol and does not use drugs. Medical:  has a past medical history of Depression,  Diabetes mellitus without complication (HCC), Hypertension, and Pedal edema. Surgical: Kristy Cunningham  has no past surgical history on file. Family: family history includes Breast cancer in her paternal grandmother.  Laboratory Chemistry Profile   Renal Lab Results  Component Value Date   BUN 14 02/08/2023   CREATININE 0.82 02/08/2023   BCR 17 02/08/2023   GFRNONAA >60 04/02/2022    Hepatic Lab Results  Component Value Date   AST 20 02/08/2023   ALT 28 04/02/2022   ALBUMIN 4.4 02/08/2023   ALKPHOS 107 02/08/2023   LIPASE 76 (H) 04/02/2022    Electrolytes Lab Results  Component Value Date   NA 139 02/08/2023   K 4.1 02/08/2023   CL 97 02/08/2023   CALCIUM 10.4 (H) 02/08/2023   MG 2.1 02/08/2023    Bone Lab Results  Component Value Date   25OHVITD1 32 02/08/2023   25OHVITD2 <1.0 02/08/2023   25OHVITD3 32 02/08/2023    Inflammation (CRP: Acute Phase) (ESR: Chronic Phase) Lab Results  Component Value Date   CRP 2 02/08/2023   ESRSEDRATE 68 (H) 02/08/2023   LATICACIDVEN 1.8 02/27/2021         Note: Above Lab results reviewed.  Recent Imaging Review  DG PAIN CLINIC C-ARM 1-60 MIN NO REPORT Fluoro was used, but no Radiologist interpretation will be provided.  Please refer to NOTES tab for provider progress note. Note: Reviewed        Physical Exam  General appearance: Well nourished, well developed, and well hydrated. In no apparent acute distress Mental status: Alert, oriented x 3 (person, place, & time)       Respiratory: No evidence of acute respiratory distress Eyes: PERLA Vitals: BP (!) 162/99   Pulse (!) 118   Temp (!) 96.6 F (35.9 C) (Temporal)   Resp 16   Ht 5' (1.524 m)   Wt 160 lb (72.6 kg)   LMP 05/11/2016 (Approximate)   SpO2 97%   BMI 31.25 kg/m  BMI: Estimated body mass index is 31.25  kg/m as calculated from the following:   Height as of this encounter: 5' (1.524 m).   Weight as of this encounter: 160 lb (72.6 kg). Ideal: Ideal body  weight: 45.5 kg (100 lb 4.9 oz) Adjusted ideal body weight: 56.3 kg (124 lb 3 oz)  Assessment   Diagnosis Status  1. Chronic lower extremity pain (Left)   2. Chronic low back pain (Midline) (4th area of Pain) w/ sciatica (Left)   3. Lumbar radiculopathy (Left)   4. Chronic knee pain (2ry area of Pain) (Left)   5. Postop check   6. Swelling of foot (Left)   7. Abnormal MRI, lumbar spine (11/30/2022) ([facility])    Controlled Controlled Controlled   Updated Problems: No problems updated.  Plan of Care  Problem-specific:  Assessment and Plan    Lumbar Radiculopathy Pain is primarily in the left foot, consistent with lumbar radiculopathy. Previous lumbar epidural steroid injections at L5-S1 provided partial relief, especially for the S1 nerve distribution. Pain and sensory changes persist in the L4 and L5 nerve distributions. MRI shows bilateral issues: protrusion at L2-3 affecting the right L2 nerve and issues at L3-4 affecting the left L3 nerve. The role of local anesthetics and steroids in diagnosis and treatment was explained. Surgery is currently not preferred. Schedule a third epidural steroid injection at L4-5. Monitor pain and sensory changes post-injection. Consider repeating lumbar MRI if symptoms persist.  Peripheral Edema Bilateral leg swelling may be related to cardiac or renal issues. An echocardiogram is scheduled, and a heart monitor has been provided. The primary care physician adjusted antihypertensive medication and prescribed Lasix  for fluid reduction. It was explained that epidural steroid injections are unlikely to affect swelling if related to cardiac or renal issues. Continue with the echocardiogram and heart monitor. Follow up with the primary care physician for renal function tests and blood pressure management. Monitor response to the increased dose of Lasix .  Hypertension Currently on a higher dose of antihypertensive medication and awaiting a new prescription  for Lasix . Continue current antihypertensive medication. Start new prescription for Lasix  once available. Monitor blood pressure and follow up with the primary care physician.  General Health Maintenance Routine blood work is scheduled, and follow-up with the primary care physician for ongoing management is advised. Complete scheduled blood work on February 13. Follow up with the primary care physician for results and further management.  Follow-up Follow up with the pain management specialist after the third epidural steroid injection. Follow up with the primary care physician for cardiac and renal evaluations. Return for echocardiogram and heart monitor placement. Follow up in April for heart monitor results.       Kristy Cunningham has a current medication list which includes the following long-term medication(s): duloxetine, fluticasone, furosemide , losartan , omeprazole, and lantus solostar.  Pharmacotherapy (Medications Ordered): No orders of the defined types were placed in this encounter.  Orders:  Orders Placed This Encounter  Procedures   Lumbar Epidural Injection    Standing Status:   Future    Expiration Date:   08/01/2023    Scheduling Instructions:     Procedure: Interlaminar Lumbar Epidural Steroid injection (LESI)  L4-5     Laterality: Left-sided     Sedation: With Sedation.     Timeframe: ASAA    Where will this procedure be performed?:   [facility]   Nursing Instructions:    Please complete this patient's postprocedure evaluation.    Scheduling Instructions:     Please complete this  patient's postprocedure evaluation.   Follow-up plan:   Return for (ECT): (L) L4-5 LESI #3.      Interventional Therapies  Risk Factors  Considerations  Medical Comorbidities:     Planned  Pending:   Therapeutic left L3-4 LESI #2    Under consideration:   Diagnostic/therapeutic left-sided L3-4 LESI #2    Completed:   Diagnostic/therapeutic left-sided L3-4 LESI  x1 (03/02/2023) (5-0/10) (   Therapeutic  Palliative (PRN) options:   None established   Completed by other providers:   (01/18/2023) diagnostic left sacroiliac joint injection by [facility] (12/14/2022) lower extremity EMG by [facility]      Recent Visits Date Type Provider Dept  04/06/23 Procedure visit Tanya Glisson, MD [facility]  03/15/23 Office Visit Tanya Glisson, MD [facility]  03/02/23 Procedure visit Tanya Glisson, MD [facility]  02/08/23 Office Visit Tanya Glisson, MD [facility]  Showing recent visits within past 90 days and meeting all other requirements Today's Visits Date Type Provider Dept  05/04/23 Office Visit Tanya Glisson, MD [facility]  Showing today's visits and meeting all other requirements Future Appointments No visits were found meeting these conditions. Showing future appointments within next 90 days and meeting all other requirements  I discussed the assessment and treatment plan with the patient. The patient was provided an opportunity to ask questions and all were answered. The patient agreed with the plan and demonstrated an understanding of the instructions.  Patient advised to call back or seek an in-person evaluation if the symptoms or condition worsens.  Duration of encounter: .  Total time on encounter, as per AMA guidelines included both the face-to-face and non-face-to-face time personally spent by the physician and/or other qualified health care professional(s) on the day of the encounter (includes time in activities that require the physician or other qualified health care professional and does not include time in activities normally performed by clinical staff). Physician's time may include the following activities when performed: Preparing to see the patient (e.g., pre-charting review of records, searching for previously ordered imaging, lab work, and  nerve conduction tests) Review of prior analgesic pharmacotherapies. Reviewing PMP Interpreting ordered tests (e.g., lab work, imaging, nerve conduction tests) Performing post-procedure evaluations, including interpretation of diagnostic procedures Obtaining and/or reviewing separately obtained history Performing a medically appropriate examination and/or evaluation Counseling and educating the patient/family/caregiver Ordering medications, tests, or procedures Referring and communicating with other health care professionals (when not separately reported) Documenting clinical information in the electronic or other health record Independently interpreting results (not separately reported) and communicating results to the patient/ family/caregiver Care coordination (not separately reported)  Note by: Glisson DELENA Tanya, MD Date: 05/04/2023; Time: 1:30 PM

## 2023-05-04 NOTE — Patient Instructions (Signed)

## 2023-05-17 ENCOUNTER — Ambulatory Visit: Payer: Medicaid Other | Attending: Cardiology

## 2023-05-17 ENCOUNTER — Other Ambulatory Visit: Payer: Self-pay | Admitting: Emergency Medicine

## 2023-05-17 DIAGNOSIS — I4892 Unspecified atrial flutter: Secondary | ICD-10-CM

## 2023-05-17 LAB — ECHOCARDIOGRAM COMPLETE
AV Mean grad: 4 mm[Hg]
AV Peak grad: 6.9 mm[Hg]
Ao pk vel: 1.31 m/s
Calc EF: 39.4 %
S' Lateral: 3.4 cm
Single Plane A2C EF: 41.2 %
Single Plane A4C EF: 37.9 %

## 2023-05-18 ENCOUNTER — Other Ambulatory Visit: Payer: Self-pay

## 2023-05-18 DIAGNOSIS — I5020 Unspecified systolic (congestive) heart failure: Secondary | ICD-10-CM

## 2023-05-18 LAB — BASIC METABOLIC PANEL
BUN/Creatinine Ratio: 27 — ABNORMAL HIGH (ref 9–23)
BUN: 20 mg/dL (ref 6–24)
CO2: 26 mmol/L (ref 20–29)
Calcium: 9.6 mg/dL (ref 8.7–10.2)
Chloride: 97 mmol/L (ref 96–106)
Creatinine, Ser: 0.74 mg/dL (ref 0.57–1.00)
Glucose: 166 mg/dL — ABNORMAL HIGH (ref 70–99)
Potassium: 4.1 mmol/L (ref 3.5–5.2)
Sodium: 139 mmol/L (ref 134–144)
eGFR: 96 mL/min/{1.73_m2} (ref >59)

## 2023-05-18 MED ORDER — METOPROLOL SUCCINATE ER 25 MG PO TB24: 25.0000 mg | ORAL_TABLET | Freq: Every day | ORAL | 3 refills | Status: DC

## 2023-05-18 MED ORDER — FUROSEMIDE 40 MG PO TABS: 40.0000 mg | ORAL_TABLET | Freq: Every day | ORAL | 3 refills | Status: AC

## 2023-05-19 ENCOUNTER — Telehealth: Payer: Self-pay | Admitting: Family

## 2023-05-19 NOTE — Telephone Encounter (Signed)
Lvm to schedule referral appt

## 2023-05-23 ENCOUNTER — Telehealth: Payer: Self-pay | Admitting: Internal Medicine

## 2023-05-23 NOTE — Progress Notes (Unsigned)
 Advanced [facility] Failure Clinic Note   Referring Physician: Debbe Odea, MD PCP: Sandrea Hughs, NP Cardiologist: Debbe Odea, MD (last seen 02/25)  Chief Complaint:   HPI:  Kristy Cunningham is a 53 y/o female with a history of atrial flutter, HTN, DM, depression and chronic heart failure.   Echo 05/17/23: EF 40-45%, normal RV, mild MR  She presents today for her initial HF visit with a chief complaint of     Review of Systems: [y] = yes, [ ]  = no   General: Weight gain [ ] ; Weight loss [ ] ; Anorexia [ ] ; Fatigue [ ] ; Fever [ ] ; Chills [ ] ; Weakness [ ]   Cardiac: Chest pain/pressure [ ] ; Resting SOB [ ] ; Exertional SOB [ ] ; Orthopnea [ ] ; Pedal Edema [ ] ; Palpitations [ ] ; Syncope [ ] ; Presyncope [ ] ; Paroxysmal nocturnal dyspnea[ ]   Pulmonary: Cough [ ] ; Wheezing[ ] ; Hemoptysis[ ] ; Sputum [ ] ; Snoring [ ]   GI: Vomiting[ ] ; Dysphagia[ ] ; Melena[ ] ; Hematochezia [ ] ; Heartburn[ ] ; Abdominal pain [ ] ; Constipation [ ] ; Diarrhea [ ] ; BRBPR [ ]   GU: Hematuria[ ] ; Dysuria [ ] ; Nocturia[ ]   Vascular: Pain in legs with walking [ ] ; Pain in feet with lying flat [ ] ; Non-healing sores [ ] ; Stroke [ ] ; TIA [ ] ; Slurred speech [ ] ;  Neuro: Headaches[ ] ; Vertigo[ ] ; Seizures[ ] ; Paresthesias[ ] ;Blurred vision [ ] ; Diplopia [ ] ; Vision changes [ ]   Ortho/Skin: Arthritis [ ] ; Joint pain [ ] ; Muscle pain [ ] ; Joint swelling [ ] ; Back Pain [ ] ; Rash [ ]   Psych: Depression[ ] ; Anxiety[ ]   Heme: Bleeding problems [ ] ; Clotting disorders [ ] ; Anemia [ ]   Endocrine: Diabetes [ ] ; Thyroid dysfunction[ ]    Past Medical History:  Diagnosis Date   Depression    Diabetes mellitus without complication (HCC)    Hypertension    Pedal edema     Current Outpatient Medications  Medication Sig Dispense Refill   BISACODYL 5 MG EC tablet Take 5 mg by mouth daily.     COLACE 100 MG capsule Take 100 mg by mouth 2 (two) times daily as needed.     DULoxetine (CYMBALTA) 60 MG capsule Take 60 mg by  mouth daily.     fluticasone (FLONASE) 50 MCG/ACT nasal spray USE 2 SPRAY(S) IN EACH NOSTRIL ONCE DAILY FOR ALLERGIES     furosemide (LASIX) 40 MG tablet Take 1 tablet (40 mg total) by mouth daily. 90 tablet 3   Glucose Blood (BLOOD GLUCOSE TEST STRIPS 333 VI) SMARTSIG:Via Meter Daily     glucose blood test strip CHECK BLOOD SUGAR ONCE DAILY     Insulin Pen Needle (UNIFINE PENTIPS) 31G X 6 MM MISC      LANTUS SOLOSTAR 100 UNIT/ML Solostar Pen Inject 100 Units into the skin daily.     losartan (COZAAR) 100 MG tablet Take 1 tablet (100 mg total) by mouth daily. 90 tablet 3   metoprolol succinate (TOPROL XL) 25 MG 24 hr tablet Take 1 tablet (25 mg total) by mouth daily. 90 tablet 3   MIRALAX 17 GM/SCOOP powder Take by mouth.     omeprazole (PRILOSEC) 20 MG capsule      tiZANidine (ZANAFLEX) 4 MG tablet Take 4 mg by mouth 3 (three) times daily.     No current facility-administered medications for this visit.    No Known Allergies    Social History   Socioeconomic History   Marital status: Married  Spouse name: Not on file   Number of children: Not on file   Years of education: Not on file   Highest education level: Not on file  Occupational History   Not on file  Tobacco Use   Smoking status: Never   Smokeless tobacco: Never  Vaping Use   Vaping status: Never Used  Substance and Sexual Activity   Alcohol use: No   Drug use: Never   Sexual activity: Yes  Other Topics Concern   Not on file  Social History Narrative   Not on file   Social Drivers of Health   Financial Resource Strain: Not on file  Food Insecurity: Not on file  Transportation Needs: Not on file  Physical Activity: Not on file  Stress: Not on file  Social Connections: Not on file  Intimate Partner Violence: Not on file      Family History  Problem Relation Age of Onset   Breast cancer Paternal Grandmother        PHYSICAL EXAM: General:  Well appearing. No respiratory difficulty HEENT:  normal Neck: supple. no JVD. Carotids 2+ bilat; no bruits. No lymphadenopathy or thyromegaly appreciated. Cor: PMI nondisplaced. Regular rate & rhythm. No rubs, gallops or murmurs. Lungs: clear Abdomen: soft, nontender, nondistended. No hepatosplenomegaly. No bruits or masses. Good bowel sounds. Extremities: no cyanosis, clubbing, rash, edema Neuro: alert & oriented x 3, cranial nerves grossly intact. moves all 4 extremities w/o difficulty. Affect pleasant.  ECG:   ASSESSMENT & PLAN:  1: Chronic heart failure with reduced ejection fraction- - suspect due to - NYHA class - euvolemic - weighing daily - Echo 05/17/23: EF 40-45%, normal RV, mild MR - continue  - BNP  2: HTN- - BP - saw PCP [name] - BMET 05/17/23 reviewed and showed sodium 139, potassium 4.1, creatinine 0.74 & GFR 96  3: Atrial flutter- - saw cardiology ([facility]-Etang) 02/25  4: DM- - A1c    Delma Freeze, FNP 05/23/23

## 2023-05-23 NOTE — Telephone Encounter (Signed)
 Pt confirmed appt for 05/24/23

## 2023-05-24 ENCOUNTER — Encounter: Payer: Self-pay | Admitting: Family

## 2023-05-24 ENCOUNTER — Telehealth (HOSPITAL_COMMUNITY): Payer: Self-pay | Admitting: Pharmacy Technician

## 2023-05-24 ENCOUNTER — Other Ambulatory Visit (HOSPITAL_COMMUNITY): Payer: Self-pay

## 2023-05-24 ENCOUNTER — Ambulatory Visit: Payer: Medicaid Other | Attending: Family | Admitting: Family

## 2023-05-24 VITALS — BP 155/85 | HR 96 | Ht 60.0 in | Wt 166.2 lb

## 2023-05-24 DIAGNOSIS — I110 Hypertensive heart disease with heart failure: Secondary | ICD-10-CM | POA: Insufficient documentation

## 2023-05-24 DIAGNOSIS — Z79899 Other long term (current) drug therapy: Secondary | ICD-10-CM | POA: Diagnosis not present

## 2023-05-24 DIAGNOSIS — E119 Type 2 diabetes mellitus without complications: Secondary | ICD-10-CM | POA: Diagnosis not present

## 2023-05-24 DIAGNOSIS — I10 Essential (primary) hypertension: Secondary | ICD-10-CM

## 2023-05-24 DIAGNOSIS — E1141 Type 2 diabetes mellitus with diabetic mononeuropathy: Secondary | ICD-10-CM | POA: Diagnosis not present

## 2023-05-24 DIAGNOSIS — F32A Depression, unspecified: Secondary | ICD-10-CM | POA: Diagnosis not present

## 2023-05-24 DIAGNOSIS — I5022 Chronic systolic (congestive) heart failure: Secondary | ICD-10-CM | POA: Insufficient documentation

## 2023-05-24 DIAGNOSIS — I4892 Unspecified atrial flutter: Secondary | ICD-10-CM | POA: Insufficient documentation

## 2023-05-24 DIAGNOSIS — E8770 Fluid overload, unspecified: Secondary | ICD-10-CM | POA: Diagnosis not present

## 2023-05-24 DIAGNOSIS — I428 Other cardiomyopathies: Secondary | ICD-10-CM | POA: Insufficient documentation

## 2023-05-24 DIAGNOSIS — Z794 Long term (current) use of insulin: Secondary | ICD-10-CM | POA: Insufficient documentation

## 2023-05-24 DIAGNOSIS — M4310 Spondylolisthesis, site unspecified: Secondary | ICD-10-CM | POA: Insufficient documentation

## 2023-05-24 DIAGNOSIS — I5020 Unspecified systolic (congestive) heart failure: Secondary | ICD-10-CM | POA: Diagnosis present

## 2023-05-24 DIAGNOSIS — Z5309 Procedure and treatment not carried out because of other contraindication: Secondary | ICD-10-CM | POA: Diagnosis not present

## 2023-05-24 DIAGNOSIS — R0602 Shortness of breath: Secondary | ICD-10-CM | POA: Diagnosis present

## 2023-05-24 DIAGNOSIS — G8929 Other chronic pain: Secondary | ICD-10-CM | POA: Insufficient documentation

## 2023-05-24 DIAGNOSIS — R600 Localized edema: Secondary | ICD-10-CM | POA: Diagnosis present

## 2023-05-24 MED ORDER — EMPAGLIFLOZIN 10 MG PO TABS: 10.0000 mg | ORAL_TABLET | Freq: Every day | ORAL | 5 refills | Status: DC

## 2023-05-24 NOTE — Telephone Encounter (Signed)
 Advanced Heart Failure Patient Advocate Encounter  Prior Authorization for [place] Pepper has been approved.    PA# [id number] Effective dates: 05/10/23 through 05/23/24  Archer Asa, CPhT

## 2023-05-24 NOTE — Patient Instructions (Addendum)
 Medication Changes:  START: JARDIANCE 10MG  ONCE DAILY   Special Instructions // Education:  Begin weighing daily and call for an overnight weight gain of 3 pounds or more or a weekly weight gain of more than 5 pounds.  Drink 60-64 ounce fluids daily  Follow-Up in: 2 WEEKS AS SCHEDULED WITH TINA, NP  If you have any questions or concerns before your next appointment please send [place] a message through mychart or call our office at [phone] Monday-Friday 8 am-5 pm.   If you have an urgent need after hours on the weekend please call your Primary Cardiologist or the [facility] Clinic in [place] at [phone].  At the [facility] Clinic, you and your health needs are our priority. We have a designated team specialized in the treatment of Heart Failure. This Care Team includes your primary Heart Failure Specialized Cardiologist (physician), Advanced Practice Providers (APPs- Physician Assistants and Nurse Practitioners), and Pharmacist who all work together to provide you with the care you need, when you need it.   You may see any of the following providers on your designated Care Team at your next follow up:  Dr. Arvilla Meres Dr. Marca Ancona Dr. Dorthula Nettles Dr. Theresia Bough Tonye Becket, NP Robbie Lis, [place] [address] [place], [place] Brynda Peon, NP [place] Lee, NP Clarisa Kindred, NP Enos Fling, PharmD

## 2023-05-24 NOTE — Telephone Encounter (Signed)
 Patient Advocate Encounter   Received notification from [place] [employer] that prior authorization for Jardiance is required.   PA submitted on CoverMyMeds Key [id number] Status is pending   Will continue to follow.

## 2023-05-25 ENCOUNTER — Encounter: Payer: Self-pay | Admitting: Pain Medicine

## 2023-05-25 ENCOUNTER — Ambulatory Visit: Admission: RE | Admit: 2023-05-25 | Payer: Medicaid Other | Source: Ambulatory Visit

## 2023-05-25 ENCOUNTER — Ambulatory Visit (HOSPITAL_BASED_OUTPATIENT_CLINIC_OR_DEPARTMENT_OTHER): Payer: Medicaid Other | Admitting: Pain Medicine

## 2023-05-25 VITALS — BP 125/72 | HR 82 | Temp 97.7°F | Resp 16 | Ht 60.0 in | Wt 167.0 lb

## 2023-05-25 DIAGNOSIS — Z5189 Encounter for other specified aftercare: Secondary | ICD-10-CM

## 2023-05-25 DIAGNOSIS — Z538 Procedure and treatment not carried out for other reasons: Secondary | ICD-10-CM | POA: Insufficient documentation

## 2023-05-25 DIAGNOSIS — I428 Other cardiomyopathies: Secondary | ICD-10-CM | POA: Diagnosis not present

## 2023-05-25 MED ORDER — IOHEXOL 300 MG/ML  SOLN: 10.0000 mL | Freq: Once | INTRAMUSCULAR | Status: DC | PRN

## 2023-05-25 MED ORDER — ROPIVACAINE HCL 2 MG/ML IJ SOLN: 2.0000 mL | Freq: Once | INTRAMUSCULAR | Status: DC

## 2023-05-25 MED ORDER — TRIAMCINOLONE ACETONIDE 40 MG/ML IJ SUSP: 40.0000 mg | Freq: Once | INTRAMUSCULAR | Status: DC

## 2023-05-25 MED ORDER — PENTAFLUOROPROP-TETRAFLUOROETH EX AERO: INHALATION_SPRAY | Freq: Once | CUTANEOUS | Status: DC

## 2023-05-25 MED ORDER — FENTANYL CITRATE (PF) 100 MCG/2ML IJ SOLN: 25.0000 ug | INTRAMUSCULAR | Status: DC | PRN

## 2023-05-25 MED ORDER — LIDOCAINE HCL 2 % IJ SOLN: 20.0000 mL | Freq: Once | INTRAMUSCULAR | Status: DC

## 2023-05-25 MED ORDER — SODIUM CHLORIDE 0.9% FLUSH: 2.0000 mL | Freq: Once | INTRAVENOUS | Status: DC

## 2023-05-25 MED ORDER — MIDAZOLAM HCL 5 MG/5ML IJ SOLN: 0.5000 mg | Freq: Once | INTRAMUSCULAR | Status: DC

## 2023-05-25 NOTE — Progress Notes (Signed)
 Patient reschedule due to resent fluid overload, currently being monitored with Holter. Since procedure is elective we will reschedule.

## 2023-05-25 NOTE — Progress Notes (Signed)
 Safety precautions to be maintained throughout the outpatient stay will include: orient to surroundings, keep bed in low position, maintain call bell within reach at all times, provide assistance with transfer out of bed and ambulation.

## 2023-05-31 ENCOUNTER — Ambulatory Visit
Admission: RE | Admit: 2023-05-31 | Discharge: 2023-05-31 | Disposition: A | Discharge status: Home / Self Care | Payer: Medicaid Other | Source: Ambulatory Visit | Attending: Primary Care | Admitting: Primary Care

## 2023-05-31 DIAGNOSIS — Z1231 Encounter for screening mammogram for malignant neoplasm of breast: Secondary | ICD-10-CM | POA: Insufficient documentation

## 2023-06-01 ENCOUNTER — Telehealth: Payer: Self-pay | Admitting: Cardiology

## 2023-06-01 NOTE — Telephone Encounter (Signed)
 Email notification received from [employer] stating the patient's ZIO XT heart monitor, ordered on 05/01/23 by Dr. Azucena Cecil was still pending return.  Serial #- [id number]  I called and spoke with the patient. She confirms she did not place the heart monitor until after her echo appointment on 05/17/23. Per the patient, she removed the monitor yesterday and plans to mail this today.

## 2023-06-06 ENCOUNTER — Telehealth: Payer: Self-pay | Admitting: Family

## 2023-06-06 NOTE — Progress Notes (Unsigned)
 Advanced Heart Failure Clinic Note   Referring Physician: Debbe Odea, MD PCP: Sandrea Hughs, NP @ [facility] (last seen 01/25) Cardiologist: Debbe Odea, MD (last seen 02/25)  Chief Complaint: shortness of breath/ pedal edema  HPI:  Kristy Cunningham is a 53 y/o female with a history of atrial flutter, HTN, DM, depression, chronic pain, spondylolisthesis and chronic heart failure.   Echo 05/17/23: EF 40-45%, normal RV, mild MR  She presents today, with her mom & grandson, for her initial HF visit with a chief complaint of minimal shortness of breath. Has associated palpitations, fatigue, pedal edema, dizziness at times & abdominal distention along with this. She says that her feet have been swelling for many months and sometimes they get so swollen that they hurt. Unable to wear compression socks because they hurt. Denies chest pain, cough or difficulty sleeping. Does admit to getting stressed easily.   NAS but does eat out every other week at seafood restaurant .   Works at [employer] at [employer] and goes from home to home. Is not drinking much fluids daily because she can't drink fluids while in the client's home;, estimates that she drinks a couple of cups of liquid daily.   Wearing zio until March 5th.   Review of Systems: [y] = yes, [ ]  = no   General: Weight gain [ ] ; Weight loss [ ] ; Anorexia [ ] ; Fatigue [place]ienne ]; Fever [ ] ; Chills [ ] ; Weakness [ ]   Cardiac: Chest pain/pressure [ ] ; Resting SOB [ ] ; Exertional SOB [place]ove.Etienne ]; Orthopnea [ ] ; Pedal Edema [place]ienne ]; Palpitations [place]ove.[place]ienne ]; Syncope [ ] ; Dizziness [place] ]; Paroxysmal nocturnal dyspnea[ ]   Pulmonary: Cough [ ] ; Wheezing[ ] ; Hemoptysis[ ] ; Sputum [ ] ; Snoring [ ]   GI: Vomiting[ ] ; Dysphagia[ ] ; Melena[ ] ; Hematochezia [ ] ; Heartburn[ ] ; Abdominal pain [ ] ; Constipation [ ] ; Diarrhea [ ] ; Abdominal Distention Cove.Etienne ]  GU: Hematuria[ ] ; Dysuria [ ] ; Nocturia[ ]   Vascular: Pain in legs with walking [ ] ; Pain in feet with lying flat  [ ] ; Non-healing sores [ ] ; Stroke [ ] ; TIA [ ] ; Slurred speech [ ] ;  Neuro: Headaches[ ] ; Vertigo[ ] ; Seizures[ ] ; Paresthesias[ ] ;Blurred vision [ ] ; Diplopia [ ] ; Vision changes [ ]   Ortho/Skin: Arthritis [ ] ; Joint pain [place]ove.Etienne ]; Muscle pain [ ] ; Joint swelling [ ] ; Back Pain [ ] ; Rash [ ]   Psych: Depression[y ]; Anxiety[ ]   Heme: Bleeding problems [ ] ; Clotting disorders [ ] ; Anemia [ ]   Endocrine: Diabetes [place].Etienne ]; Thyroid dysfunction[ ]    Past Medical History:  Diagnosis Date   Depression    Diabetes mellitus without complication (HCC)    Hypertension    Low back strain 10/26/2022   Pedal edema     Current Outpatient Medications  Medication Sig Dispense Refill   BISACODYL 5 MG EC tablet Take 5 mg by mouth daily.     COLACE 100 MG capsule Take 100 mg by mouth 2 (two) times daily as needed.     DULoxetine (CYMBALTA) 60 MG capsule Take 60 mg by mouth daily.     empagliflozin (JARDIANCE) 10 MG TABS tablet Take 1 tablet (10 mg total) by mouth daily before breakfast. 30 tablet 5   fluticasone (FLONASE) 50 MCG/ACT nasal spray USE 2 SPRAY(S) IN EACH NOSTRIL ONCE DAILY FOR ALLERGIES     furosemide (LASIX) 40 MG tablet Take 1 tablet (40 mg total) by mouth daily. 90 tablet 3  Glucose Blood (BLOOD GLUCOSE TEST STRIPS 333 VI) SMARTSIG:Via Meter Daily     glucose blood test strip CHECK BLOOD SUGAR ONCE DAILY     Insulin Pen Needle (UNIFINE PENTIPS) 31G X 6 MM MISC      LANTUS SOLOSTAR 100 UNIT/ML Solostar Pen Inject 100 Units into the skin daily.     losartan (COZAAR) 100 MG tablet Take 1 tablet (100 mg total) by mouth daily. 90 tablet 3   metoprolol succinate (TOPROL XL) 25 MG 24 hr tablet Take 1 tablet (25 mg total) by mouth daily. 90 tablet 3   MIRALAX 17 GM/SCOOP powder Take by mouth.     omeprazole (PRILOSEC) 20 MG capsule      tiZANidine (ZANAFLEX) 4 MG tablet Take 4 mg by mouth 3 (three) times daily.     No current facility-administered medications for this visit.    No Known  Allergies    Social History   Socioeconomic History   Marital status: Married    Spouse name: Not on file   Number of children: Not on file   Years of education: Not on file   Highest education level: Not on file  Occupational History   Not on file  Tobacco Use   Smoking status: Never   Smokeless tobacco: Never  Vaping Use   Vaping status: Never Used  Substance and Sexual Activity   Alcohol use: No   Drug use: Never   Sexual activity: Yes  Other Topics Concern   Not on file  Social History Narrative   Not on file   Social Drivers of Health   Financial Resource Strain: Not on file  Food Insecurity: Not on file  Transportation Needs: Not on file  Physical Activity: Not on file  Stress: Not on file  Social Connections: Not on file  Intimate Partner Violence: Not on file      Family History  Problem Relation Age of Onset   Breast cancer Paternal Grandmother    There were no vitals filed for this visit.  Wt Readings from Last 3 Encounters:  05/25/23 167 lb (75.8 kg)  05/24/23 166 lb 3.2 oz (75.4 kg)  05/04/23 160 lb (72.6 kg)   Lab Results  Component Value Date   CREATININE 0.74 05/17/2023   CREATININE 0.82 02/08/2023   CREATININE 0.66 04/02/2022   PHYSICAL EXAM:  General: Well appearing. No resp difficulty HEENT: normal Neck: supple, no JVD Cor: Regular rhythm, rate. No rubs, gallops or murmurs Lungs: clear Abdomen: soft, nontender, nondistended. Extremities: no cyanosis, clubbing, rash, 1+ pitting edema bilateral lower legs Neuro: alert & oriented X 3. Moves all 4 extremities w/o difficulty. Affect pleasant   ECG: 05/01/23 was NSR   ASSESSMENT & PLAN:  1: NICM with reduced ejection fraction- - unclear etiology; suspect possibly atrial flutter or unknown CAD - consider ischemic work-up with cath - NYHA class II - euvolemic - begin weighing daily and parameters to call reviewed - Echo 05/17/23: EF 40-45%, normal RV, mild MR - continue furosemide  40mg  daily - continue losartan 100mg  daily; discussed changing this to entresto - continue metoprolol succinate 25mg  daily - begin jardiance 10mg  daily - discussed adding MRA next visit - BMET next visit - reviewed daily fluid intake of 60-64 ounces and encouraged to drink fluids in the car when she's going from one client's house to another  2: HTN- - BP 155/85 - takes her losartan and metoprolol at bedtime so hasn't taken these meds yet today - saw PCP (  Rubio) - BMET 05/17/23 reviewed and showed sodium 139, potassium 4.1, creatinine 0.74 & GFR 96 - BMET next visit  3: Atrial flutter- - saw cardiology (Agbor-Etang) 02/25; returns 04/25 - EKG 05/01/23 was NSR; cardiology getting PCP's EKG to see if patient was in a flutter at that time - currently wearing zio monitor until 05/31/23  4: DM- - using 100units of lantus daily - will request A1c from PCP office   Return in 2 weeks, sooner if needed.   Delma Freeze, FNP 06/06/23

## 2023-06-07 ENCOUNTER — Other Ambulatory Visit
Admission: RE | Admit: 2023-06-07 | Discharge: 2023-06-07 | Disposition: A | Discharge status: Home / Self Care | Source: Ambulatory Visit | Attending: Family | Admitting: Family

## 2023-06-07 ENCOUNTER — Encounter: Payer: Self-pay | Admitting: Family

## 2023-06-07 ENCOUNTER — Other Ambulatory Visit (HOSPITAL_COMMUNITY): Payer: Self-pay

## 2023-06-07 ENCOUNTER — Ambulatory Visit: Payer: Medicaid Other | Admitting: Family

## 2023-06-07 VITALS — BP 149/82 | HR 102 | Wt 163.0 lb

## 2023-06-07 DIAGNOSIS — I10 Essential (primary) hypertension: Secondary | ICD-10-CM | POA: Diagnosis not present

## 2023-06-07 DIAGNOSIS — E119 Type 2 diabetes mellitus without complications: Secondary | ICD-10-CM | POA: Diagnosis not present

## 2023-06-07 DIAGNOSIS — I110 Hypertensive heart disease with heart failure: Secondary | ICD-10-CM | POA: Diagnosis not present

## 2023-06-07 DIAGNOSIS — R21 Rash and other nonspecific skin eruption: Secondary | ICD-10-CM | POA: Diagnosis present

## 2023-06-07 DIAGNOSIS — Z794 Long term (current) use of insulin: Secondary | ICD-10-CM | POA: Diagnosis not present

## 2023-06-07 DIAGNOSIS — R609 Edema, unspecified: Secondary | ICD-10-CM | POA: Diagnosis not present

## 2023-06-07 DIAGNOSIS — I4892 Unspecified atrial flutter: Secondary | ICD-10-CM | POA: Diagnosis not present

## 2023-06-07 DIAGNOSIS — F32A Depression, unspecified: Secondary | ICD-10-CM | POA: Insufficient documentation

## 2023-06-07 DIAGNOSIS — I428 Other cardiomyopathies: Secondary | ICD-10-CM | POA: Insufficient documentation

## 2023-06-07 DIAGNOSIS — G8929 Other chronic pain: Secondary | ICD-10-CM | POA: Diagnosis not present

## 2023-06-07 DIAGNOSIS — Z79899 Other long term (current) drug therapy: Secondary | ICD-10-CM | POA: Insufficient documentation

## 2023-06-07 DIAGNOSIS — R000 Tachycardia, unspecified: Secondary | ICD-10-CM | POA: Insufficient documentation

## 2023-06-07 DIAGNOSIS — E1141 Type 2 diabetes mellitus with diabetic mononeuropathy: Secondary | ICD-10-CM

## 2023-06-07 DIAGNOSIS — I5020 Unspecified systolic (congestive) heart failure: Secondary | ICD-10-CM | POA: Diagnosis not present

## 2023-06-07 DIAGNOSIS — I5022 Chronic systolic (congestive) heart failure: Secondary | ICD-10-CM | POA: Insufficient documentation

## 2023-06-07 DIAGNOSIS — L299 Pruritus, unspecified: Secondary | ICD-10-CM | POA: Diagnosis not present

## 2023-06-07 LAB — BASIC METABOLIC PANEL
Anion gap: 13 (ref 5–15)
BUN: 27 mg/dL — ABNORMAL HIGH (ref 6–20)
CO2: 29 mmol/L (ref 22–32)
Calcium: 9.4 mg/dL (ref 8.9–10.3)
Chloride: 94 mmol/L — ABNORMAL LOW (ref 98–111)
Creatinine, Ser: 0.9 mg/dL (ref 0.44–1.00)
GFR, Estimated: >60 mL/min (ref >60)
Glucose, Bld: 472 mg/dL — ABNORMAL HIGH (ref 70–99)
Potassium: 4 mmol/L (ref 3.5–5.1)
Sodium: 136 mmol/L (ref 135–145)

## 2023-06-07 MED ORDER — METOPROLOL SUCCINATE ER 50 MG PO TB24: 50.0000 mg | ORAL_TABLET | Freq: Every day | ORAL | 6 refills | Status: AC

## 2023-06-07 MED ORDER — DAPAGLIFLOZIN PROPANEDIOL 10 MG PO TABS: 10.0000 mg | ORAL_TABLET | Freq: Every day | ORAL | 3 refills | Status: AC

## 2023-06-07 NOTE — Patient Instructions (Signed)
 Medication Changes:  STOP Jardiance  AFTER 4 DAYS start Farxiga 10mg  (1 tab) daily  INCREASE Metoprolol 50mg  (1 tab) daily  Lab Work:  Go over to the [facility]. Go pass the gift shop and have your blood work completed.  We will only call you if the results are abnormal or if the provider would like to make medication changes.   Follow-Up in: Please follow up with the Advanced Heart Failure Clinic in 1 month with Clarisa Kindred, NP.  At the [facility] Clinic, you and your health needs are our priority. We have a designated team specialized in the treatment of Heart Failure. This Care Team includes your primary Heart Failure Specialized Cardiologist (physician), Advanced Practice Providers (APPs- Physician Assistants and Nurse Practitioners), and Pharmacist who all work together to provide you with the care you need, when you need it.   You may see any of the following providers on your designated Care Team at your next follow up:  Dr. Arvilla Meres Dr. Marca Ancona Dr. Dorthula Nettles Dr. Theresia Bough Clarisa Kindred, NP Tonye Becket, NP Robbie Lis, [place] [facility] [place], [place] Brynda Peon, NP [place] Lee, NP Karle Plumber, PharmD   Please be sure to bring in all your medications bottles to every appointment.   Need to Contact us:  If you have any questions or concerns before your next appointment please send [place] a message through [facility] or call our office at [phone].    TO LEAVE A MESSAGE FOR THE NURSE SELECT OPTION 2, PLEASE LEAVE A MESSAGE INCLUDING: YOUR NAME DATE OF BIRTH CALL BACK NUMBER REASON FOR CALL**this is important as we prioritize the call backs  YOU WILL RECEIVE A CALL BACK THE SAME DAY AS LONG AS YOU CALL BEFORE 4:00 PM

## 2023-06-08 ENCOUNTER — Other Ambulatory Visit (HOSPITAL_COMMUNITY): Payer: Self-pay

## 2023-06-08 ENCOUNTER — Telehealth: Payer: Self-pay

## 2023-06-08 NOTE — Telephone Encounter (Signed)
 Advanced Heart Failure Patient Advocate Encounter  Prior authorization for Marcelline Deist has been submitted and approved. Test billing returns $4 for 90 day supply.  Key: [id number] Effective: 05/25/2023 to 06/07/2024  Burnell Blanks, CPhT Rx Patient Advocate Phone: [phone]

## 2023-06-15 DIAGNOSIS — I4892 Unspecified atrial flutter: Secondary | ICD-10-CM | POA: Diagnosis not present

## 2023-06-19 ENCOUNTER — Encounter: Payer: Self-pay | Admitting: Emergency Medicine

## 2023-07-04 ENCOUNTER — Telehealth: Payer: Self-pay | Admitting: Family

## 2023-07-04 NOTE — Progress Notes (Unsigned)
 Advanced Heart Failure Clinic Note     PCP: Sandrea Hughs, NP @ [facility] (last seen 01/25) Cardiologist: Debbe Odea, MD (last seen 02/25)  Chief Complaint: shortness of breath  HPI:  Kristy Cunningham is a 53 y/o female with a history of atrial flutter, HTN, DM, depression, chronic pain, spondylolisthesis and chronic heart failure.   Echo 05/17/23: EF 40-45%, normal RV, mild MR  She presents today, with her mom, for a HF follow-up visit with a chief complaint of minimal shortness of breath. Has associated fatigue, occasional palpitations, increased sciatica pain, pedal edema (improving), continued rash on arms/ legs and increased stress. Denies chest pain, abdominal distention, dizziness or difficulty sleeping.   Under a lot of increased stress with family issues. Went to jail overnight as son took out warrant on patient. Has noticed worsening rash and itching due to the stress.   At last visit, metoprolol was increased to 50mg  daily and farxiga 10mg  was started.   NAS but does eat out every other week at seafood restaurant .   Works at [employer] at [employer] and goes from home to home.    ROS: All systems negative except what is listed in HPI, PMH and Problem List   Past Medical History:  Diagnosis Date   Depression    Diabetes mellitus without complication (HCC)    Hypertension    Low back strain 10/26/2022   Pedal edema     Current Outpatient Medications  Medication Sig Dispense Refill   BISACODYL 5 MG EC tablet Take 5 mg by mouth daily.     COLACE 100 MG capsule Take 100 mg by mouth 2 (two) times daily as needed.     dapagliflozin propanediol (FARXIGA) 10 MG TABS tablet Take 1 tablet (10 mg total) by mouth daily before breakfast. 30 tablet 3   DULoxetine (CYMBALTA) 60 MG capsule Take 60 mg by mouth daily. (Patient not taking: Reported on 06/07/2023)     fluticasone (FLONASE) 50 MCG/ACT nasal spray USE 2 SPRAY(S) IN EACH NOSTRIL ONCE DAILY FOR ALLERGIES      furosemide (LASIX) 40 MG tablet Take 1 tablet (40 mg total) by mouth daily. 90 tablet 3   Glucose Blood (BLOOD GLUCOSE TEST STRIPS 333 VI) SMARTSIG:Via Meter Daily     glucose blood test strip CHECK BLOOD SUGAR ONCE DAILY     Insulin Pen Needle (UNIFINE PENTIPS) 31G X 6 MM MISC      LANTUS SOLOSTAR 100 UNIT/ML Solostar Pen Inject 100 Units into the skin daily.     losartan (COZAAR) 100 MG tablet Take 1 tablet (100 mg total) by mouth daily. 90 tablet 3   metoprolol succinate (TOPROL XL) 50 MG 24 hr tablet Take 1 tablet (50 mg total) by mouth daily. 30 tablet 6   MIRALAX 17 GM/SCOOP powder Take by mouth.     omeprazole (PRILOSEC) 20 MG capsule      tiZANidine (ZANAFLEX) 4 MG tablet Take 4 mg by mouth 3 (three) times daily.     No current facility-administered medications for this visit.    Allergies  Allergen Reactions   Jardiance [Empagliflozin] Rash      Social History   Socioeconomic History   Marital status: Married    Spouse name: Not on file   Number of children: Not on file   Years of education: Not on file   Highest education level: Not on file  Occupational History   Not on file  Tobacco Use   Smoking  status: Never   Smokeless tobacco: Never  Vaping Use   Vaping status: Never Used  Substance and Sexual Activity   Alcohol use: No   Drug use: Never   Sexual activity: Yes  Other Topics Concern   Not on file  Social History Narrative   Not on file   Social Drivers of Health   Financial Resource Strain: Not on file  Food Insecurity: Not on file  Transportation Needs: Not on file  Physical Activity: Not on file  Stress: Not on file  Social Connections: Not on file  Intimate Partner Violence: Not on file      Family History  Problem Relation Age of Onset   Breast cancer Paternal Grandmother    Vitals:   07/05/23 0907  BP: (!) 153/83  Pulse: 82  SpO2: 98%  Weight: 162 lb 3.2 oz (73.6 kg)   Wt Readings from Last 3 Encounters:  07/05/23 162 lb 3.2 oz  (73.6 kg)  06/07/23 163 lb (73.9 kg)  05/25/23 167 lb (75.8 kg)   Lab Results  Component Value Date   CREATININE 0.90 06/07/2023   CREATININE 0.74 05/17/2023   CREATININE 0.82 02/08/2023    PHYSICAL EXAM:  General: Well appearing. No resp difficulty HEENT: normal Neck: supple, no JVD Cor: Regular rhythm, rate. No rubs, gallops or murmurs Lungs: clear Abdomen: soft, nontender, nondistended. Extremities: no cyanosis, clubbing, rash, trace pitting edema bilateral lower legs; rash noted on bilateral arms/ legs Neuro: alert & oriented X 3. Moves all 4 extremities w/o difficulty. Affect pleasant, anxious   ECG: not done   ASSESSMENT & PLAN:  1: NICM with reduced ejection fraction- - unclear etiology; suspect possibly atrial flutter or unknown CAD - consider ischemic work-up with cath - NYHA class II - euvolemic - weight stable from last visit here 1 month ago - Echo 05/17/23: EF 40-45%, normal RV, mild MR - continue furosemide 40mg  daily - continue losartan 100mg  daily; plan to change this to entresto at next visit - continue metoprolol succinate 50mg  daily  - continue farxiga 10mg  daily - start spironolactone 25mg  daily - BMET today, 1 week & next OV - reviewed daily fluid intake of 60-64 ounces   2: HTN- - BP 153/83 - takes her losartan and metoprolol at bedtime  - saw PCP [name] - BMET 06/07/23 reviewed and showed sodium 136, potassium 4.0, creatinine 0.9 & GFR >60 - BMET today  3: Atrial flutter- - saw cardiology (Agbor-Etang) 02/25; returns 05/25 - EKG 05/01/23 was NSR - zio 06/09/23: no AF/ flutter, 1 run of SVT lasting 6 beats  4: DM- - using 100units of lantus daily - A1c (PCP office) 04/12/23 reviewed and was 10.8%  5: Rash- - noted on both arms/ lower legs - she feels like it's worse with increased stress - emphasized seeing PCP for further evaluation   Return in 1 month, sooner if needed.   Delma Freeze, FNP 07/04/23

## 2023-07-04 NOTE — Telephone Encounter (Incomplete)
 Called to confirm/remind patient of their appointment at the [facility] Clinic on 07/05/23.   Appointment:   [x] Confirmed  [] Left mess   [] No answer/No voice mail  [] Phone not in service  Patient reminded to bring all medications and/or complete list.  Confirmed patient has transportation. Gave directions, instructed to utilize valet parking.

## 2023-07-05 ENCOUNTER — Encounter: Payer: Self-pay | Admitting: Family

## 2023-07-05 ENCOUNTER — Other Ambulatory Visit (HOSPITAL_COMMUNITY): Payer: Self-pay

## 2023-07-05 ENCOUNTER — Telehealth: Payer: Self-pay

## 2023-07-05 ENCOUNTER — Other Ambulatory Visit
Admission: RE | Admit: 2023-07-05 | Discharge: 2023-07-05 | Disposition: A | Discharge status: Home / Self Care | Source: Ambulatory Visit | Attending: Family | Admitting: Family

## 2023-07-05 ENCOUNTER — Ambulatory Visit: Payer: Medicaid Other | Admitting: Cardiology

## 2023-07-05 ENCOUNTER — Ambulatory Visit (HOSPITAL_BASED_OUTPATIENT_CLINIC_OR_DEPARTMENT_OTHER): Admitting: Family

## 2023-07-05 VITALS — BP 153/83 | HR 82 | Wt 162.2 lb

## 2023-07-05 DIAGNOSIS — M4310 Spondylolisthesis, site unspecified: Secondary | ICD-10-CM | POA: Insufficient documentation

## 2023-07-05 DIAGNOSIS — E119 Type 2 diabetes mellitus without complications: Secondary | ICD-10-CM | POA: Diagnosis not present

## 2023-07-05 DIAGNOSIS — Z7984 Long term (current) use of oral hypoglycemic drugs: Secondary | ICD-10-CM | POA: Diagnosis not present

## 2023-07-05 DIAGNOSIS — R21 Rash and other nonspecific skin eruption: Secondary | ICD-10-CM

## 2023-07-05 DIAGNOSIS — E1141 Type 2 diabetes mellitus with diabetic mononeuropathy: Secondary | ICD-10-CM

## 2023-07-05 DIAGNOSIS — I5022 Chronic systolic (congestive) heart failure: Secondary | ICD-10-CM | POA: Insufficient documentation

## 2023-07-05 DIAGNOSIS — Z794 Long term (current) use of insulin: Secondary | ICD-10-CM | POA: Insufficient documentation

## 2023-07-05 DIAGNOSIS — I4892 Unspecified atrial flutter: Secondary | ICD-10-CM | POA: Diagnosis not present

## 2023-07-05 DIAGNOSIS — I5020 Unspecified systolic (congestive) heart failure: Secondary | ICD-10-CM

## 2023-07-05 DIAGNOSIS — F32A Depression, unspecified: Secondary | ICD-10-CM | POA: Insufficient documentation

## 2023-07-05 DIAGNOSIS — I428 Other cardiomyopathies: Secondary | ICD-10-CM | POA: Diagnosis not present

## 2023-07-05 DIAGNOSIS — Z79899 Other long term (current) drug therapy: Secondary | ICD-10-CM | POA: Diagnosis not present

## 2023-07-05 DIAGNOSIS — I110 Hypertensive heart disease with heart failure: Secondary | ICD-10-CM | POA: Insufficient documentation

## 2023-07-05 DIAGNOSIS — Z62820 Parent-biological child conflict: Secondary | ICD-10-CM | POA: Diagnosis not present

## 2023-07-05 DIAGNOSIS — I10 Essential (primary) hypertension: Secondary | ICD-10-CM

## 2023-07-05 DIAGNOSIS — G8929 Other chronic pain: Secondary | ICD-10-CM | POA: Insufficient documentation

## 2023-07-05 DIAGNOSIS — Z638 Other specified problems related to primary support group: Secondary | ICD-10-CM | POA: Insufficient documentation

## 2023-07-05 LAB — BASIC METABOLIC PANEL WITH GFR
Anion gap: 8 (ref 5–15)
BUN: 31 mg/dL — ABNORMAL HIGH (ref 6–20)
CO2: 30 mmol/L (ref 22–32)
Calcium: 9.5 mg/dL (ref 8.9–10.3)
Chloride: 96 mmol/L — ABNORMAL LOW (ref 98–111)
Creatinine, Ser: 0.87 mg/dL (ref 0.44–1.00)
GFR, Estimated: >60 mL/min (ref >60)
Glucose, Bld: 342 mg/dL — ABNORMAL HIGH (ref 70–99)
Potassium: 4.1 mmol/L (ref 3.5–5.1)
Sodium: 134 mmol/L — ABNORMAL LOW (ref 135–145)

## 2023-07-05 MED ORDER — SPIRONOLACTONE 25 MG PO TABS: 25.0000 mg | ORAL_TABLET | Freq: Every day | ORAL | 3 refills | Status: AC

## 2023-07-05 NOTE — Patient Instructions (Addendum)
 Medication Changes:  START SPIRONOLACTONE 25 MG ONCE DAILY   Lab Work:  Go over to the [facility]ICAL MALL. Go pass the gift shop and have your blood work completed TODAY AND AGAIN IN 1 WEEK.  We will only call you if the results are abnormal or if the provider would like to make medication changes.   Follow-Up in: 1 MONTH WITH Clarisa Kindred, FNP.  At the [facility], you and your health needs are our priority. We have a designated team specialized in the treatment of Heart Failure. This Care Team includes your primary Heart Failure Specialized Cardiologist (physician), Advanced Practice Providers (APPs- Physician Assistants and Nurse Practitioners), and Pharmacist who all work together to provide you with the care you need, when you need it.   You may see any of the following providers on your designated Care Team at your next follow up:  Dr. Arvilla Meres Dr. Marca Ancona Dr. Dorthula Nettles Dr. Theresia Bough Clarisa Kindred, FNP Enos Fling, RPH-CPP  Please be sure to bring in all your medications bottles to every appointment.   Need to Contact us:  If you have any questions or concerns before your next appointment please send [place] a message through [place] or call our office at [phone].    TO LEAVE A MESSAGE FOR THE NURSE SELECT OPTION 2, PLEASE LEAVE A MESSAGE INCLUDING: YOUR NAME DATE OF BIRTH CALL BACK NUMBER REASON FOR CALL**this is important as we prioritize the call backs  YOU WILL RECEIVE A CALL BACK THE SAME DAY AS LONG AS YOU CALL BEFORE 4:00 PM

## 2023-07-05 NOTE — Progress Notes (Signed)
 [facility] - HEART FAILURE CLINIC - PHARMACIST COUNSELING NOTE  Adherence Assessment  Do you ever forget to take your medication? [] Yes [x] No  Do you ever skip doses due to side effects?   [] Yes [x] No  Do you have trouble affording your medicines? [] Yes [x] No  Are you ever unable to pick up your medication due to transportation difficulties? [] Yes [x] No  Do you ever stop taking your medications because you don't believe they are helping? [] Yes [x] No  Do you check your weight daily? [] Yes [x] No  Do you check your blood pressure daily? [] Yes [x] No  Adherence strategy: Pill box   Barriers to obtaining medications: None reported    Vital signs: HR 82, BP 153/83, weight 162 lbs.  ECHO: Date 05/17/23, EF 40-45%, normal RV, mild MR Renal function: Date 06/07/23, GFR > 60  Current Guideline-Directed Medical Therapy/Evidence Based Medicine  ACE/ARB/ARNI: Losartan 100 mg daily Target dose: 150 mg daily  Beta Blocker: Metoprolol succinate 50 mg daily Target dose: 200 mg daily   Aldosterone Antagonist:  N/A Target dose: N/A  SGLT2i: Dapagliflozin 10 mg daily Target dose: On target dose  Diuretic: Furosemide 40 mg daily  ASSESSMENT 53 year old female who presents to the HF clinic for a 1 month follow-up appointment with her mother. PMH is significant for atrial flutter, HTN, DM, depression, chronic pain, spondylolisthesis and chronic heart failure. Notes stress has increased recently which has worsened the rash she has had for "years". Also reports that she has been dealing with a throbbing, burning sensation in her feet that has really been bothering her. Encouraged patient to follow up with her PCP.   Recent ED visit or hospitalization (past 6 months): None   COUNSELING POINTS/CLINICAL PEARLS  Spironolactone (Goal: 25-50 mg daily) Warn patient to report dehydration, hypotension, or symptoms of worsening renal function.  Side effects may include diarrhea,  nausea, vomiting, abdominal cramping, fever, leg cramps, lethargy, mental confusion, decreased libido (spironolactone), irregular menses (spironolactone), and rash. Advise patient to avoid potassium supplements and foods containing high levels of potassium, including salt substitutes.  PLAN Continue current regimen as directed by NP Plan to start spironolactone 25 mg daily with BMET next week  Consider eventual transition of losartan to Entresto if/when able to tolerate  Copay for 90 day supply is $4.00   Time spent: 20 minutes  Littie Deeds, PharmD Pharmacy Resident  07/05/2023 8:21 AM

## 2023-07-05 NOTE — Telephone Encounter (Signed)
 Patient Advocate Encounter  Test billing for Sherryll Burger returns $4 copay for 90 days.  Burnell Blanks, CPhT Rx Patient Advocate Phone: [phone]

## 2023-07-19 ENCOUNTER — Ambulatory Visit: Admitting: Pain Medicine

## 2023-07-26 ENCOUNTER — Ambulatory Visit (HOSPITAL_BASED_OUTPATIENT_CLINIC_OR_DEPARTMENT_OTHER): Admitting: Pain Medicine

## 2023-07-26 ENCOUNTER — Telehealth: Payer: Self-pay

## 2023-07-26 DIAGNOSIS — G8929 Other chronic pain: Secondary | ICD-10-CM

## 2023-07-26 DIAGNOSIS — R700 Elevated erythrocyte sedimentation rate: Secondary | ICD-10-CM | POA: Insufficient documentation

## 2023-07-26 DIAGNOSIS — R7989 Other specified abnormal findings of blood chemistry: Secondary | ICD-10-CM | POA: Insufficient documentation

## 2023-07-26 DIAGNOSIS — E1165 Type 2 diabetes mellitus with hyperglycemia: Secondary | ICD-10-CM | POA: Insufficient documentation

## 2023-07-26 DIAGNOSIS — Z91199 Patient's noncompliance with other medical treatment and regimen due to unspecified reason: Secondary | ICD-10-CM

## 2023-07-26 NOTE — Telephone Encounter (Signed)
 The patient never had an EMG done, she was scheduled but cancelled it. If you want her to have it she will do it. Do you want to put in an order and have it done?

## 2023-07-26 NOTE — Progress Notes (Signed)
 Department: [facility] Date: 07/26/2023  Encounter Type: Patient requested evaluation. Event: NO SHOW.  Reason: None communicated.

## 2023-07-26 NOTE — Patient Instructions (Signed)
  ____________________________________________________________________________________________  Muscle Spasms & Cramps  Cause(s):  Most common - vitamin and/or electrolyte (calcium, potassium, sodium, etc.) deficiencies. Post procedure - steroids (injected, oral, or inhaled) can make your kidneys excrete (loose) electrolytes. Most of the time this will not cause any symptoms however, if you happen to be borderline low on your electrolytes, it may temporarily triggering cramps & spasms.  Possible triggers: Sweating - causes loss of electrolytes thru the skin. Steroids - causes loss of electrolytes thru the urine.  Treatment: (over-the-counter)  Gatorade (or any other electrolyte-replenishing drink) - Take 1, 8 oz glass with each meal (3 times a day). Mechanism of action: Replenishes lost electrolytes. Magnesium 400 to 500 mg - Take 1 tablet twice a day (one with breakfast and one at bedtime). If you have kidney disease talk to your primary care physician before taking any Magnesium. Mechanism of action: Magnesium is a natural muscle relaxant. Tonic Water with quinine - Take 1, 8 oz glass before bedtime.  Mechanism of action: Quinine is used to treat spasms.  Last Update: 10/06/2022  ____________________________________________________________________________________________

## 2023-07-27 ENCOUNTER — Other Ambulatory Visit (HOSPITAL_BASED_OUTPATIENT_CLINIC_OR_DEPARTMENT_OTHER): Admitting: Pain Medicine

## 2023-07-27 DIAGNOSIS — M79605 Pain in left leg: Secondary | ICD-10-CM

## 2023-07-27 DIAGNOSIS — G8929 Other chronic pain: Secondary | ICD-10-CM

## 2023-07-27 DIAGNOSIS — M5416 Radiculopathy, lumbar region: Secondary | ICD-10-CM

## 2023-08-01 ENCOUNTER — Telehealth: Payer: Self-pay | Admitting: Family

## 2023-08-01 NOTE — Progress Notes (Deleted)
 [facility] Note     PCP: Ardia Kraft, NP @ [facility] (last seen 01/25) Cardiologist: Constancia Delton, MD (last seen 02/25)  Chief Complaint:    HPI:  Kristy Cunningham is a 53 y/o female with a history of atrial flutter, HTN, DM, depression, chronic pain, spondylolisthesis and chronic heart failure.   Echo 05/17/23: EF 40-45%, normal RV, mild MR  Seen in HF clinic 04/25 where spiro 25mg  daily was started.   She presents today, with her mom, for a HF follow-up visit with a chief complaint of   Did not get BMET checked 1 week after spiro was started.   Under a lot of increased stress with family issues. Went to jail overnight as son took out warrant on patient. Has noticed worsening rash and itching due to the stress.   NAS but does eat out every other week at seafood restaurant .   Works at [employer] at [employer] and goes from home to home.    ROS: All systems negative except what is listed in HPI, PMH and Problem List   Past Medical History:  Diagnosis Date   Depression    Diabetes mellitus without complication (HCC)    Hypertension    Low back strain 10/26/2022   Pedal edema     Current Outpatient Medications  Medication Sig Dispense Refill   BISACODYL 5 MG EC tablet Take 5 mg by mouth daily.     COLACE 100 MG capsule Take 100 mg by mouth 2 (two) times daily as needed.     dapagliflozin  propanediol (FARXIGA ) 10 MG TABS tablet Take 1 tablet (10 mg total) by mouth daily before breakfast. 30 tablet 3   DULoxetine (CYMBALTA) 60 MG capsule Take 60 mg by mouth daily.     empagliflozin  (JARDIANCE ) 10 MG TABS tablet Take 1 tablet by mouth daily before breakfast.     fluticasone (FLONASE) 50 MCG/ACT nasal spray USE 2 SPRAY(S) IN EACH NOSTRIL ONCE DAILY FOR ALLERGIES     furosemide  (LASIX ) 20 MG tablet Take 20 mg by mouth daily.     furosemide  (LASIX ) 40 MG tablet Take 1 tablet (40 mg total) by mouth daily. 90 tablet 3   Glucose Blood (BLOOD GLUCOSE  TEST STRIPS 333 VI) SMARTSIG:Via Meter Daily     glucose blood test strip CHECK BLOOD SUGAR ONCE DAILY     Insulin Pen Needle (UNIFINE PENTIPS) 31G X 6 MM MISC      LANTUS SOLOSTAR 100 UNIT/ML Solostar Pen Inject 100 Units into the skin daily.     losartan  (COZAAR ) 100 MG tablet Take 1 tablet (100 mg total) by mouth daily. 90 tablet 3   losartan  (COZAAR ) 100 MG tablet Take 1 tablet by mouth daily.     metoprolol  succinate (TOPROL  XL) 50 MG 24 hr tablet Take 1 tablet (50 mg total) by mouth daily. 30 tablet 6   MIRALAX 17 GM/SCOOP powder Take by mouth.     omeprazole (PRILOSEC) 20 MG capsule      spironolactone  (ALDACTONE ) 25 MG tablet Take 1 tablet (25 mg total) by mouth daily. 90 tablet 3   tiZANidine (ZANAFLEX) 4 MG tablet Take 4 mg by mouth 3 (three) times daily.     No current facility-administered medications for this visit.    Allergies  Allergen Reactions   Jardiance  [Empagliflozin ] Rash      Social History   Socioeconomic History   Marital status: Married    Spouse name: Not on file  Number of children: Not on file   Years of education: Not on file   Highest education level: Not on file  Occupational History   Not on file  Tobacco Use   Smoking status: Never   Smokeless tobacco: Never  Vaping Use   Vaping status: Never Used  Substance and Sexual Activity   Alcohol use: No   Drug use: Never   Sexual activity: Yes  Other Topics Concern   Not on file  Social History Narrative   Not on file   Social Drivers of Health   Financial Resource Strain: Not on file  Food Insecurity: Not on file  Transportation Needs: Not on file  Physical Activity: Not on file  Stress: Not on file  Social Connections: Not on file  Intimate Partner Violence: Not on file      Family History  Problem Relation Age of Onset   Breast cancer Paternal Grandmother       PHYSICAL EXAM:  General: Well appearing. No resp difficulty HEENT: normal Neck: supple, no JVD Cor: Regular  rhythm, rate. No rubs, gallops or murmurs Lungs: clear Abdomen: soft, nontender, nondistended. Extremities: no cyanosis, clubbing, rash, trace pitting edema bilateral lower legs; rash noted on bilateral arms/ legs Neuro: alert & oriented X 3. Moves all 4 extremities w/o difficulty. Affect pleasant, anxious   ECG: not done   ASSESSMENT & PLAN:  1: NICM with reduced ejection fraction- - unclear etiology; suspect possibly atrial flutter or unknown CAD - consider ischemic work-up with cath - NYHA class II - euvolemic - weight 162.2 from last visit here 1 month ago - Echo 05/17/23: EF 40-45%, normal RV, mild MR - continue furosemide  40mg  daily - continue losartan  100mg  daily; plan to change this to entresto at next visit - continue metoprolol  succinate 50mg  daily  - continue farxiga  10mg  daily - continue spironolactone  25mg  daily - BMET today - reviewed daily fluid intake of 60-64 ounces   2: HTN- - BP  - takes her losartan  and metoprolol  at bedtime  - saw PCP Kristy Cunningham) - BMET 07/05/23 reviewed: sodium 134, potassium 4.1, creatinine 0.87 & GFR >60 - BMET today  3: Atrial flutter- - saw cardiology (Kristy Cunningham) 02/25; returns 05/25 - EKG 05/01/23 was NSR - zio 06/09/23: no AF/ flutter, 1 run of SVT lasting 6 beats  4: DM- - using 100units of lantus daily - A1c (PCP office) 04/12/23 reviewed and was 10.8%  5: Rash- - noted on both arms/ lower legs - she feels like it's worse with increased stress - emphasized seeing PCP for further evaluation     Charlette Console, FNP 08/01/23

## 2023-08-01 NOTE — Telephone Encounter (Signed)
 Called to confirm/remind patient of their appointment at the [facility] on 08/02/23.   Appointment:   [x] Confirmed  [] Left mess   [] No answer/No voice mail  [] VM Full/unable to leave message  [] Phone not in service  Patient reminded to bring all medications and/or complete list.  Confirmed patient has transportation. Gave directions, instructed to utilize valet parking.

## 2023-08-02 ENCOUNTER — Ambulatory Visit: Attending: Cardiology | Admitting: Cardiology

## 2023-08-02 ENCOUNTER — Encounter: Admitting: Family

## 2023-08-02 ENCOUNTER — Encounter: Payer: Self-pay | Admitting: Cardiology

## 2023-08-02 VITALS — BP 152/88 | HR 110 | Ht 60.0 in | Wt 159.4 lb

## 2023-08-02 DIAGNOSIS — I10 Essential (primary) hypertension: Secondary | ICD-10-CM

## 2023-08-02 DIAGNOSIS — I4892 Unspecified atrial flutter: Secondary | ICD-10-CM

## 2023-08-02 DIAGNOSIS — I5020 Unspecified systolic (congestive) heart failure: Secondary | ICD-10-CM

## 2023-08-02 MED ORDER — ENTRESTO 97-103 MG PO TABS: 1.0000 | ORAL_TABLET | Freq: Two times a day (BID) | ORAL | 3 refills | Status: AC

## 2023-08-02 MED ORDER — SACUBITRIL-VALSARTAN 97-103 MG PO TABS: 1.0000 | ORAL_TABLET | Freq: Two times a day (BID) | ORAL | Status: DC

## 2023-08-02 NOTE — Patient Instructions (Signed)
 Medication Instructions:  Your physician recommends the following medication changes.  STOP TAKING: Losartan    START TAKING: Entresto 97/103 twice daily  *If you need a refill on your cardiac medications before your next appointment, please call your pharmacy*  Lab Work: Your provider would like for you to have following labs drawn today CBC, BMP.    If you have labs (blood work) drawn today and your tests are completely normal, you will receive your results only by: MyChart Message (if you have MyChart) OR A paper copy in the mail If you have any lab test that is abnormal or we need to change your treatment, we will call you to review the results.  Testing/Procedures:  [place] [facility] DEPT OF [place] [place] [name] [place] [address] Dept: [phone] Loc: [phone]  Kristy Cunningham  08/02/2023  You are scheduled for a Cardiac Catheterization on Wednesday, May 28 with Dr. Antionette Kirks.  1. Please arrive at the [facility] Entrance of [facility], [address], [place] [address] at 8:30 AM (This is 1 hour(s) prior to your procedure time).  Proceed to the Check-In Desk directly inside the entrance.  Procedure Parking: Use the entrance off of the [facility] side of the hospital. Turn right upon entering and follow the driveway to parking that is directly in front of the Heart [facility] Vascular Center. There is no valet parking available at this entrance, however there is an awning directly in front of the Heart & Vascular Center for drop off/ pick up for patients.  Special note: Every effort is made to have your procedure done on time. Please understand that emergencies sometimes delay scheduled procedures.  2. Diet: Do not eat solid foods after midnight.  The patient may have clear liquids until 5am upon the day of the procedure.  3. Labs: You will have blood drawn today  08/02/2023 (BMP, CBC)  4. Medication instructions in preparation for your procedure: -Hold lasix  the morning of the procedure  -Hold spironolactone  the morning of the procedure -ALL diabetic medications should be held the morning of procedure (Farxiga ) -Take HALF (1/2) of your normal lantus dose the night before the procedure if you normally take at night time   Contrast Allergy: No  On the morning of your procedure, take an Aspirin 81 mg and any morning medicines NOT listed above.  You may use sips of water.  5. Plan to go home the same day, you will only stay overnight if medically necessary. 6. Bring a current list of your medications and current insurance cards. 7. You MUST have a responsible person to drive you home. 8. Someone MUST be with you the first 24 hours after you arrive home or your discharge will be delayed. 9. Please wear clothes that are easy to get on and off and wear slip-on shoes.  Thank you for allowing us  to care for you!   --  Invasive Cardiovascular services   Follow-Up: At [facility], you and your health needs are our priority.  As part of our continuing mission to provide you with exceptional heart care, our providers are all part of one team.  This team includes your primary Cardiologist (physician) and Advanced Practice Providers or APPs (Physician Assistants and Nurse Practitioners) who all work together to provide you with the care you need, when you need it.  Your next appointment:   After procedure  Provider:   You may see Constancia Delton, MD or one of the following Advanced Practice Providers on your designated Care Team:   Laneta Pintos, NP Gildardo Labrador, PA-C Varney Gentleman, PA-C Cadence [place] Deal, PA-C Ronald Cockayne, NP Morey Ar, NP    We recommend signing up for the patient portal called "MyChart".  Sign up information is provided on this After Visit Summary.  MyChart is used to connect with patients for Virtual  Visits (Telemedicine).  Patients are able to view lab/test results, encounter notes, upcoming appointments, etc.  Non-urgent messages can be sent to your provider as well.   To learn more about what you can do with MyChart, go to [email or web address].

## 2023-08-02 NOTE — Telephone Encounter (Signed)
 Patient notified. I will send referral

## 2023-08-02 NOTE — Progress Notes (Signed)
 Cardiology Office Note:    Date:  08/02/2023   ID:  [name], DOB 02/19/1971, MRN [id number]  PCP:  Ardia Kraft, NP   [facility] Providers Cardiologist:  Constancia Delton, MD     Referring MD: Ardia Kraft, NP   Chief Complaint  Patient presents with   Follow-up    2 month follow visit. Following up post heart monitor. Patient states that she experiences swelling in her feet. Patient states that she small red spots on her feet that is sensitive. Meds reviewed.     History of Present Illness:    Kristy Cunningham is a 53 y.o. female with a hx of HFrEF EF 40 to 45%, hypertension, diabetes, sciatica who presents for follow-up.  Previously seen due to leg edema, started on Lasix  20 mg daily initially.  Due to persistent edema, Lasix  increased to 40 mg daily.  She states edema is much improved at current dose of Lasix .  Echocardiogram was obtained showing mild to moderately reduced EF 40 to 45%.    ECG from PCPs office requested, not obtained.  Notes indicate atrial flutter on EKG obtained back in January 2025.  Past Medical History:  Diagnosis Date   Depression    Diabetes mellitus without complication (HCC)    Hypertension    Low back strain 10/26/2022   Pedal edema     History reviewed. No pertinent surgical history.  Current Medications: Current Meds  Medication Sig   BISACODYL 5 MG EC tablet Take 5 mg by mouth daily.   dapagliflozin  propanediol (FARXIGA ) 10 MG TABS tablet Take 1 tablet (10 mg total) by mouth daily before breakfast.   DULoxetine (CYMBALTA) 60 MG capsule Take 60 mg by mouth daily.   fluticasone (FLONASE) 50 MCG/ACT nasal spray USE 2 SPRAY(S) IN EACH NOSTRIL ONCE DAILY FOR ALLERGIES   furosemide  (LASIX ) 40 MG tablet Take 1 tablet (40 mg total) by mouth daily.   Glucose Blood (BLOOD GLUCOSE TEST STRIPS 333 VI) SMARTSIG:Via Meter Daily   glucose blood test strip CHECK BLOOD SUGAR ONCE DAILY   Insulin Pen Needle (UNIFINE PENTIPS) 31G X  6 MM MISC    LANTUS SOLOSTAR 100 UNIT/ML Solostar Pen Inject 100 Units into the skin daily.   metoprolol  succinate (TOPROL  XL) 50 MG 24 hr tablet Take 1 tablet (50 mg total) by mouth daily.   omeprazole (PRILOSEC) 20 MG capsule    sacubitril-valsartan (ENTRESTO) 97-103 MG Take 1 tablet by mouth 2 (two) times daily.   spironolactone  (ALDACTONE ) 25 MG tablet Take 1 tablet (25 mg total) by mouth daily.   tiZANidine (ZANAFLEX) 4 MG tablet Take 4 mg by mouth 3 (three) times daily.   [DISCONTINUED] losartan  (COZAAR ) 100 MG tablet Take 1 tablet (100 mg total) by mouth daily.   [DISCONTINUED] losartan  (COZAAR ) 100 MG tablet Take 1 tablet by mouth daily.     Allergies:   Jardiance  [empagliflozin ]   Social History   Socioeconomic History   Marital status: Married    Spouse name: Not on file   Number of children: Not on file   Years of education: Not on file   Highest education level: Not on file  Occupational History   Not on file  Tobacco Use   Smoking status: Never   Smokeless tobacco: Never  Vaping Use   Vaping status: Never Used  Substance and Sexual Activity   Alcohol use: No   Drug use: Never   Sexual activity: Yes  Other Topics Concern  Not on file  Social History Narrative   Not on file   Social Drivers of Health   Financial Resource Strain: Not on file  Food Insecurity: Not on file  Transportation Needs: Not on file  Physical Activity: Not on file  Stress: Not on file  Social Connections: Not on file     Family History: The patient's family history includes Breast cancer in her paternal grandmother.  ROS:   Please see the history of present illness.     All other systems reviewed and are negative.  EKGs/Labs/Other Studies Reviewed:    The following studies were reviewed today:       Recent Labs: 02/08/2023: Magnesium 2.1 07/05/2023: BUN 31; Creatinine, Ser 0.87; Potassium 4.1; Sodium 134  Recent Lipid Panel No results found for: "CHOL", "TRIG", "HDL",  "CHOLHDL", "VLDL", "LDLCALC", "LDLDIRECT"   Risk Assessment/Calculations:          Physical Exam:    VS:  BP (!) 152/88   Pulse (!) 110   Ht 5' (1.524 m)   Wt 159 lb 6.4 oz (72.3 kg)   LMP 05/11/2016 (Approximate)   SpO2 95%   BMI 31.13 kg/m     Wt Readings from Last 3 Encounters:  08/02/23 159 lb 6.4 oz (72.3 kg)  07/05/23 162 lb 3.2 oz (73.6 kg)  06/07/23 163 lb (73.9 kg)     GEN:  Well nourished, well developed in no acute distress HEENT: Normal NECK: No JVD; No carotid bruits CARDIAC: Regular, tacky RESPIRATORY:  Clear to auscultation without rales, wheezing or rhonchi  ABDOMEN: Soft, non-tender, non-distended MUSCULOSKELETAL:  no edema; No deformity  SKIN: Warm and dry NEUROLOGIC:  Alert and oriented x 3 PSYCHIATRIC:  Normal affect   ASSESSMENT:    1. HFrEF (heart failure with reduced ejection fraction) (HCC)   2. Atrial flutter, unspecified type (HCC)   3. Primary hypertension     PLAN:    In order of problems listed above:  Cardiomyopathy EF 40 to 45%.  Appears euvolemic.  Continue Lasix  40 mg daily.  Stop losartan , start Entresto 97-103 mg twice daily.  Continue Toprol -XL 50 mg daily, Aldactone  25 mg daily, Farxiga  10 mg daily.  Plan right and left heart cath.  Obtain BMP.  Already established with heart failure clinic. History of atrial flutter on EKG obtained at PCPs office 03/2023.   EKG requested for review, has not been received yet.  Will reach out to primary care physicians office. Cardiac monitor Obtained on 04/2023 showed no A-fib or flutter. Echo with moderately reduced EF 40 to 45%. Hypertension, BP elevated.  Stop losartan , start Entresto 97/103 mg twice daily.  Continue Toprol -XL, Aldactone .  Titrate Toprol -XL at follow-up visit if BP permits.  Patient is slightly tacky.  Follow-up 6 weeks for medication titration.  Informed Consent   Shared Decision Making/Informed Consent The risks [stroke (1 in 1000), death (1 in 1000), kidney failure  [usually temporary] (1 in 500), bleeding (1 in 200), allergic reaction [possibly serious] (1 in 200)], benefits (diagnostic support and management of coronary artery disease) and alternatives of a cardiac catheterization were discussed in detail with [name] and she is willing to proceed.         Medication Adjustments/Labs and Tests Ordered: Current medicines are reviewed at length with the patient today.  Concerns regarding medicines are outlined above.  Orders Placed This Encounter  Procedures   CBC   Basic metabolic panel with GFR   Meds ordered this encounter  Medications  DISCONTD: sacubitril-valsartan (ENTRESTO) 97-103 mg per tablet    ACE-inhibitors have NOT been administered in the past 36-hours.:   YES (confirmed by ordering provider)   sacubitril-valsartan (ENTRESTO) 97-103 MG    Sig: Take 1 tablet by mouth 2 (two) times daily.    Dispense:  60 tablet    Refill:  3    Patient Instructions  Medication Instructions:  Your physician recommends the following medication changes.  STOP TAKING: Losartan    START TAKING: Entresto 97/103 twice daily  *If you need a refill on your cardiac medications before your next appointment, please call your pharmacy*  Lab Work: Your provider would like for you to have following labs drawn today CBC, BMP.    If you have labs (blood work) drawn today and your tests are completely normal, you will receive your results only by: MyChart Message (if you have MyChart) OR A paper copy in the mail If you have any lab test that is abnormal or we need to change your treatment, we will call you to review the results.  Testing/Procedures:  [place] [facility] DEPT OF [place]. [facility] [place] [name] [place] [address] Dept: [phone] Loc: [phone]  LAXMI MAZZARA  08/02/2023  You are scheduled for a Cardiac Catheterization on Wednesday, May 28 with  Dr. Antionette Kirks.  1. Please arrive at the [facility] Entrance of [facility], [address], [place] [address] at 8:30 AM (This is 1 hour(s) prior to your procedure time).  Proceed to the Check-In Desk directly inside the entrance.  Procedure Parking: Use the entrance off of the [address] side of the hospital. Turn right upon entering and follow the driveway to parking that is directly in front of the [facility] Center. There is no valet parking available at this entrance, however there is an awning directly in front of the [facility] Center for drop off/ pick up for patients.  Special note: Every effort is made to have your procedure done on time. Please understand that emergencies sometimes delay scheduled procedures.  2. Diet: Do not eat solid foods after midnight.  The patient may have clear liquids until 5am upon the day of the procedure.  3. Labs: You will have blood drawn today 08/02/2023 (BMP, CBC)  4. Medication instructions in preparation for your procedure: -Hold lasix  the morning of the procedure  -Hold spironolactone  the morning of the procedure -ALL diabetic medications should be held the morning of procedure (Farxiga ) -Take HALF (1/2) of your normal lantus dose the night before the procedure if you normally take at night time   Contrast Allergy: No  On the morning of your procedure, take an Aspirin 81 mg and any morning medicines NOT listed above.  You may use sips of water.  5. Plan to go home the same day, you will only stay overnight if medically necessary. 6. Bring a current list of your medications and current insurance cards. 7. You MUST have a responsible person to drive you home. 8. Someone MUST be with you the first 24 hours after you arrive home or your discharge will be delayed. 9. Please wear clothes that are easy to get on and off and wear slip-on shoes.  Thank you for allowing us  to care for you!   -- [place] [facility]   Follow-Up: At [facility], you and your health needs are our priority.  As part of our continuing mission to provide you with exceptional heart care, our providers are all part of one team.  This team includes your primary Cardiologist (physician) and Advanced Practice Providers or APPs (Physician Assistants and Nurse Practitioners) who all work together to provide you with the care you need, when you need it.  Your next appointment:   After procedure   Provider:   You may see Constancia Delton, MD or one of the following Advanced Practice Providers on your designated Care Team:   Laneta Pintos, NP Gildardo Labrador, PA-C Varney Gentleman, PA-C Cadence [place], PA-C Ronald Cockayne, NP Morey Ar, NP    We recommend signing up for the patient portal called "MyChart".  Sign up information is provided on this After Visit Summary.  MyChart is used to connect with patients for Virtual Visits (Telemedicine).  Patients are able to view lab/test results, encounter notes, upcoming appointments, etc.  Non-urgent messages can be sent to your provider as well.   To learn more about what you can do with MyChart, go to [email or web address].     Signed, Constancia Delton, MD  08/02/2023 10:40 AM    [facility]

## 2023-08-02 NOTE — H&P (View-Only) (Signed)
 Cardiology Office Note:    Date:  08/02/2023   ID:  Kristy Cunningham, DOB 02/19/1971, MRN [id number]  PCP:  Kristy Kraft, NP   [facility] Providers Cardiologist:  Kristy Delton, MD     Referring MD: Kristy Kraft, NP   Chief Complaint  Patient presents with   Follow-up    2 month follow visit. Following up post heart monitor. Patient states that she experiences swelling in her feet. Patient states that she small red spots on her feet that is sensitive. Meds reviewed.     History of Present Illness:    Kristy Cunningham is a 53 y.o. female with a hx of HFrEF EF 40 to 45%, hypertension, diabetes, sciatica who presents for follow-up.  Previously seen due to leg edema, started on Lasix  20 mg daily initially.  Due to persistent edema, Lasix  increased to 40 mg daily.  She states edema is much improved at current dose of Lasix .  Echocardiogram was obtained showing mild to moderately reduced EF 40 to 45%.    ECG from PCPs office requested, not obtained.  Notes indicate atrial flutter on EKG obtained back in January 2025.  Past Medical History:  Diagnosis Date   Depression    Diabetes mellitus without complication (HCC)    Hypertension    Low back strain 10/26/2022   Pedal edema     History reviewed. No pertinent surgical history.  Current Medications: Current Meds  Medication Sig   BISACODYL 5 MG EC tablet Take 5 mg by mouth daily.   dapagliflozin  propanediol (FARXIGA ) 10 MG TABS tablet Take 1 tablet (10 mg total) by mouth daily before breakfast.   DULoxetine (CYMBALTA) 60 MG capsule Take 60 mg by mouth daily.   fluticasone (FLONASE) 50 MCG/ACT nasal spray USE 2 SPRAY(S) IN EACH NOSTRIL ONCE DAILY FOR ALLERGIES   furosemide  (LASIX ) 40 MG tablet Take 1 tablet (40 mg total) by mouth daily.   Glucose Blood (BLOOD GLUCOSE TEST STRIPS 333 VI) SMARTSIG:Via Meter Daily   glucose blood test strip CHECK BLOOD SUGAR ONCE DAILY   Insulin Pen Needle (UNIFINE PENTIPS) 31G X  6 MM MISC    LANTUS SOLOSTAR 100 UNIT/ML Solostar Pen Inject 100 Units into the skin daily.   metoprolol  succinate (TOPROL  XL) 50 MG 24 hr tablet Take 1 tablet (50 mg total) by mouth daily.   omeprazole (PRILOSEC) 20 MG capsule    sacubitril-valsartan (ENTRESTO) 97-103 MG Take 1 tablet by mouth 2 (two) times daily.   spironolactone  (ALDACTONE ) 25 MG tablet Take 1 tablet (25 mg total) by mouth daily.   tiZANidine (ZANAFLEX) 4 MG tablet Take 4 mg by mouth 3 (three) times daily.   [DISCONTINUED] losartan  (COZAAR ) 100 MG tablet Take 1 tablet (100 mg total) by mouth daily.   [DISCONTINUED] losartan  (COZAAR ) 100 MG tablet Take 1 tablet by mouth daily.     Allergies:   Jardiance  [empagliflozin ]   Social History   Socioeconomic History   Marital status: Married    Spouse name: Not on file   Number of children: Not on file   Years of education: Not on file   Highest education level: Not on file  Occupational History   Not on file  Tobacco Use   Smoking status: Never   Smokeless tobacco: Never  Vaping Use   Vaping status: Never Used  Substance and Sexual Activity   Alcohol use: No   Drug use: Never   Sexual activity: Yes  Other Topics Concern  Not on file  Social History Narrative   Not on file   Social Drivers of Health   Financial Resource Strain: Not on file  Food Insecurity: Not on file  Transportation Needs: Not on file  Physical Activity: Not on file  Stress: Not on file  Social Connections: Not on file     Family History: The patient's family history includes Breast cancer in her paternal grandmother.  ROS:   Please see the history of present illness.     All other systems reviewed and are negative.  EKGs/Labs/Other Studies Reviewed:    The following studies were reviewed today:       Recent Labs: 02/08/2023: Magnesium 2.1 07/05/2023: BUN 31; Creatinine, Ser 0.87; Potassium 4.1; Sodium 134  Recent Lipid Panel No results found for: "CHOL", "TRIG", "HDL",  "CHOLHDL", "VLDL", "LDLCALC", "LDLDIRECT"   Risk Assessment/Calculations:          Physical Exam:    VS:  BP (!) 152/88   Pulse (!) 110   Ht 5' (1.524 m)   Wt 159 lb 6.4 oz (72.3 kg)   LMP 05/11/2016 (Approximate)   SpO2 95%   BMI 31.13 kg/m     Wt Readings from Last 3 Encounters:  08/02/23 159 lb 6.4 oz (72.3 kg)  07/05/23 162 lb 3.2 oz (73.6 kg)  06/07/23 163 lb (73.9 kg)     GEN:  Well nourished, well developed in no acute distress HEENT: Normal NECK: No JVD; No carotid bruits CARDIAC: Regular, tacky RESPIRATORY:  Clear to auscultation without rales, wheezing or rhonchi  ABDOMEN: Soft, non-tender, non-distended MUSCULOSKELETAL:  no edema; No deformity  SKIN: Warm and dry NEUROLOGIC:  Alert and oriented x 3 PSYCHIATRIC:  Normal affect   ASSESSMENT:    1. HFrEF (heart failure with reduced ejection fraction) (HCC)   2. Atrial flutter, unspecified type (HCC)   3. Primary hypertension     PLAN:    In order of problems listed above:  Cardiomyopathy EF 40 to 45%.  Appears euvolemic.  Continue Lasix  40 mg daily.  Stop losartan , start Entresto 97-103 mg twice daily.  Continue Toprol -XL 50 mg daily, Aldactone  25 mg daily, Farxiga  10 mg daily.  Plan right and left heart cath.  Obtain BMP.  Already established with heart failure clinic. History of atrial flutter on EKG obtained at PCPs office 03/2023.   EKG requested for review, has not been received yet.  Will reach out to primary care physicians office. Cardiac monitor Obtained on 04/2023 showed no A-fib or flutter. Echo with moderately reduced EF 40 to 45%. Hypertension, BP elevated.  Stop losartan , start Entresto 97/103 mg twice daily.  Continue Toprol -XL, Aldactone .  Titrate Toprol -XL at follow-up visit if BP permits.  Patient is slightly tacky.  Follow-up 6 weeks for medication titration.  Informed Consent   Shared Decision Making/Informed Consent The risks [stroke (1 in 1000), death (1 in 1000), kidney failure  [usually temporary] (1 in 500), bleeding (1 in 200), allergic reaction [possibly serious] (1 in 200)], benefits (diagnostic support and management of coronary artery disease) and alternatives of a cardiac catheterization were discussed in detail with Kristy Cunningham and she is willing to proceed.         Medication Adjustments/Labs and Tests Ordered: Current medicines are reviewed at length with the patient today.  Concerns regarding medicines are outlined above.  Orders Placed This Encounter  Procedures   CBC   Basic metabolic panel with GFR   Meds ordered this encounter  Medications  DISCONTD: sacubitril-valsartan (ENTRESTO) 97-103 mg per tablet    ACE-inhibitors have NOT been administered in the past 36-hours.:   YES (confirmed by ordering provider)   sacubitril-valsartan (ENTRESTO) 97-103 MG    Sig: Take 1 tablet by mouth 2 (two) times daily.    Dispense:  60 tablet    Refill:  3    Patient Instructions  Medication Instructions:  Your physician recommends the following medication changes.  STOP TAKING: Losartan    START TAKING: Entresto 97/103 twice daily  *If you need a refill on your cardiac medications before your next appointment, please call your pharmacy*  Lab Work: Your provider would like for you to have following labs drawn today CBC, BMP.    If you have labs (blood work) drawn today and your tests are completely normal, you will receive your results only by: MyChart Message (if you have MyChart) OR A paper copy in the mail If you have any lab test that is abnormal or we need to change your treatment, we will call you to review the results.  Testing/Procedures:  [place] [facility] DEPT OF [place]. [facility] [place] [name] [place] [address] Dept: [phone] Loc: [phone]  LAXMI MAZZARA  08/02/2023  You are scheduled for a Cardiac Catheterization on Wednesday, May 28 with  Dr. Antionette Kirks.  1. Please arrive at the [facility] Entrance of [facility], [address], [place] [address] at 8:30 AM (This is 1 hour(s) prior to your procedure time).  Proceed to the Check-In Desk directly inside the entrance.  Procedure Parking: Use the entrance off of the [address] side of the hospital. Turn right upon entering and follow the driveway to parking that is directly in front of the [facility]. There is no valet parking available at this entrance, however there is an awning directly in front of the [facility] for drop off/ pick up for patients.  Special note: Every effort is made to have your procedure done on time. Please understand that emergencies sometimes delay scheduled procedures.  2. Diet: Do not eat solid foods after midnight.  The patient may have clear liquids until 5am upon the day of the procedure.  3. Labs: You will have blood drawn today 08/02/2023 (BMP, CBC)  4. Medication instructions in preparation for your procedure: -Hold lasix  the morning of the procedure  -Hold spironolactone  the morning of the procedure -ALL diabetic medications should be held the morning of procedure (Farxiga ) -Take HALF (1/2) of your normal lantus dose the night before the procedure if you normally take at night time   Contrast Allergy: No  On the morning of your procedure, take an Aspirin 81 mg and any morning medicines NOT listed above.  You may use sips of water.  5. Plan to go home the same day, you will only stay overnight if medically necessary. 6. Bring a current list of your medications and current insurance cards. 7. You MUST have a responsible person to drive you home. 8. Someone MUST be with you the first 24 hours after you arrive home or your discharge will be delayed. 9. Please wear clothes that are easy to get on and off and wear slip-on shoes.  Thank you for allowing us  to care for you!   -- [place] [facility]   Follow-Up: At [facility], you and your health needs are our priority.  As part of our continuing mission to provide you with exceptional heart care, our providers are all part of one team.  This team includes your primary Cardiologist (physician) and Advanced Practice Providers or APPs (Physician Assistants and Nurse Practitioners) who all work together to provide you with the care you need, when you need it.  Your next appointment:   After procedure   Provider:   You may see Kristy Delton, MD or one of the following Advanced Practice Providers on your designated Care Team:   Laneta Pintos, NP Gildardo Labrador, PA-C Varney Gentleman, PA-C Cadence [place], PA-C Ronald Cockayne, NP Morey Ar, NP    We recommend signing up for the patient portal called "MyChart".  Sign up information is provided on this After Visit Summary.  MyChart is used to connect with patients for Virtual Visits (Telemedicine).  Patients are able to view lab/test results, encounter notes, upcoming appointments, etc.  Non-urgent messages can be sent to your provider as well.   To learn more about what you can do with MyChart, go to [email or web address].     Signed, Kristy Delton, MD  08/02/2023 10:40 AM    [facility]

## 2023-08-03 LAB — BASIC METABOLIC PANEL WITH GFR
BUN/Creatinine Ratio: 31 — ABNORMAL HIGH (ref 9–23)
BUN: 28 mg/dL — ABNORMAL HIGH (ref 6–24)
CO2: 25 mmol/L (ref 20–29)
Calcium: 9.7 mg/dL (ref 8.7–10.2)
Chloride: 90 mmol/L — ABNORMAL LOW (ref 96–106)
Creatinine, Ser: 0.91 mg/dL (ref 0.57–1.00)
Glucose: 304 mg/dL — ABNORMAL HIGH (ref 70–99)
Potassium: 5.1 mmol/L (ref 3.5–5.2)
Sodium: 132 mmol/L — ABNORMAL LOW (ref 134–144)
eGFR: 75 mL/min/{1.73_m2} (ref >59)

## 2023-08-03 LAB — CBC
Hematocrit: 43.6 % (ref 34.0–46.6)
Hemoglobin: 14.3 g/dL (ref 11.1–15.9)
MCH: 29.9 pg (ref 26.6–33.0)
MCHC: 32.8 g/dL (ref 31.5–35.7)
MCV: 91 fL (ref 79–97)
Platelets: 282 x10E3/uL (ref 150–450)
RBC: 4.78 x10E6/uL (ref 3.77–5.28)
RDW: 13.5 % (ref 11.7–15.4)
WBC: 5.7 x10E3/uL (ref 3.4–10.8)

## 2023-08-10 ENCOUNTER — Ambulatory Visit: Payer: Self-pay | Admitting: Cardiology

## 2023-08-15 ENCOUNTER — Telehealth: Payer: Self-pay | Admitting: Family

## 2023-08-15 ENCOUNTER — Encounter (INDEPENDENT_AMBULATORY_CARE_PROVIDER_SITE_OTHER): Payer: Self-pay

## 2023-08-15 NOTE — Progress Notes (Deleted)
 [facility] Note     PCP: Ardia Kraft, NP @ [facility] (last seen 01/25) Cardiologist: Constancia Delton, MD (last seen 05/25)  Chief Complaint:    HPI:  Kristy Cunningham is a 52 y/o female with a history of atrial flutter, HTN, DM, depression, chronic pain, spondylolisthesis and chronic heart failure.   Echo 05/17/23: EF 40-45%, normal RV, mild MR  Seen in HF clinic 04/25 where spiro 25mg  daily was started.   Seen at cardiology office 05/25 where losartan  was changed to entresto . R/LHC scheduled for 08/23/23  She presents today, with her mom, for a HF follow-up visit with a chief complaint of     NAS but does eat out every other week at seafood restaurant .   Works at [employer] at [employer] and goes from home to home.    ROS: All systems negative except what is listed in HPI, PMH and Problem List   Past Medical History:  Diagnosis Date   Depression    Diabetes mellitus without complication (HCC)    Hypertension    Low back strain 10/26/2022   Pedal edema     Current Outpatient Medications  Medication Sig Dispense Refill   dapagliflozin  propanediol (FARXIGA ) 10 MG TABS tablet Take 1 tablet (10 mg total) by mouth daily before breakfast. 30 tablet 3   docusate sodium (COLACE) 100 MG capsule Take 100 mg by mouth daily as needed for mild constipation.     DULoxetine (CYMBALTA) 60 MG capsule Take 60 mg by mouth daily.     fluticasone (FLONASE) 50 MCG/ACT nasal spray Place 2 sprays into both nostrils daily as needed for allergies.     furosemide  (LASIX ) 40 MG tablet Take 1 tablet (40 mg total) by mouth daily. 90 tablet 3   Glucose Blood (BLOOD GLUCOSE TEST STRIPS 333 VI) SMARTSIG:Via Meter Daily     glucose blood test strip CHECK BLOOD SUGAR ONCE DAILY     Insulin Pen Needle (UNIFINE PENTIPS) 31G X 6 MM MISC      LANTUS SOLOSTAR 100 UNIT/ML Solostar Pen Inject 33 Units into the skin 2 (two) times daily.     metoprolol  succinate (TOPROL  XL) 50 MG 24  hr tablet Take 1 tablet (50 mg total) by mouth daily. 30 tablet 6   omeprazole (PRILOSEC) 20 MG capsule Take 20 mg by mouth daily as needed (acid reflux).     sacubitril -valsartan  (ENTRESTO ) 97-103 MG Take 1 tablet by mouth 2 (two) times daily. 60 tablet 3   spironolactone  (ALDACTONE ) 25 MG tablet Take 1 tablet (25 mg total) by mouth daily. 90 tablet 3   tiZANidine (ZANAFLEX) 4 MG tablet Take 6 mg by mouth at bedtime.     No current facility-administered medications for this visit.    Allergies  Allergen Reactions   Keflex [Cephalexin]     Caused Dizziness   Jardiance  [Empagliflozin ] Rash      Social History   Socioeconomic History   Marital status: Married    Spouse name: Not on file   Number of children: Not on file   Years of education: Not on file   Highest education level: Not on file  Occupational History   Not on file  Tobacco Use   Smoking status: Never   Smokeless tobacco: Never  Vaping Use   Vaping status: Never Used  Substance and Sexual Activity   Alcohol use: No   Drug use: Never   Sexual activity: Yes  Other Topics Concern  Not on file  Social History Narrative   Not on file   Social Drivers of Health   Financial Resource Strain: Not on file  Food Insecurity: Not on file  Transportation Needs: Not on file  Physical Activity: Not on file  Stress: Not on file  Social Connections: Not on file  Intimate Partner Violence: Not on file      Family History  Problem Relation Age of Onset   Breast cancer Paternal Grandmother       PHYSICAL EXAM:  General: Well appearing. No resp difficulty HEENT: normal Neck: supple, no JVD Cor: Regular rhythm, rate. No rubs, gallops or murmurs Lungs: clear Abdomen: soft, nontender, nondistended. Extremities: no cyanosis, clubbing, rash, trace pitting edema bilateral lower legs; rash noted on bilateral arms/ legs Neuro: alert & oriented X 3. Moves all 4 extremities w/o difficulty. Affect pleasant,  anxious   ECG: not done   ASSESSMENT & PLAN:  1: NICM with reduced ejection fraction- - unclear etiology; suspect possibly atrial flutter or unknown CAD - consider ischemic work-up with cath - NYHA class II - euvolemic - weight 162.2 from last visit here 6 weeks ago - Echo 05/17/23: EF 40-45%, normal RV, mild MR - R/LHC scheduled for next week - continue furosemide  40mg  daily - continue entresto  97/103mg  BID  - continue metoprolol  succinate 50mg  daily  - continue farxiga  10mg  daily - continue spironolactone  25mg  daily - BMET today - reviewed daily fluid intake of 60-64 ounces   2: HTN- - BP  - takes her metoprolol  at bedtime  - saw PCP Charlott Converse) - BMET 08/02/23 reviewed: sodium 132, potassium 5.1, creatinine 0.91 & GFR 75 - BMET today  3: Atrial flutter- - saw cardiology (Agbor-Etang) 05/25 - EKG 05/01/23 was NSR - zio 06/09/23: no AF/ flutter, 1 run of SVT lasting 6 beats  4: DM- - using 100units of lantus daily - A1c (PCP office) 04/12/23 reviewed and was 10.8%      Kristy Console, FNP 08/15/23

## 2023-08-15 NOTE — Telephone Encounter (Signed)
 Called to confirm/remind patient of their appointment at the [facility] on 08/16/23.   Appointment:   [x] Confirmed  [] Left mess   [] No answer/No voice mail  [] VM Full/unable to leave message  [] Phone not in service  Patient reminded to bring all medications and/or complete list.  Confirmed patient has transportation. Gave directions, instructed to utilize valet parking.

## 2023-08-16 ENCOUNTER — Encounter: Admitting: Family

## 2023-08-22 ENCOUNTER — Telehealth: Payer: Self-pay | Admitting: *Deleted

## 2023-08-22 NOTE — Telephone Encounter (Signed)
 Scheduled for: R/L heart cath Date: 5/28 Time: 09:30 am Arrival Time: 08:30 am Location: [facility] Instructions: Reviewed  Patient confirmed arrival time and instructions. No further questions.

## 2023-08-23 ENCOUNTER — Other Ambulatory Visit: Payer: Self-pay

## 2023-08-23 ENCOUNTER — Encounter
Admission: RE | Disposition: A | Discharge status: Home / Self Care | Payer: Self-pay | Source: Home / Self Care | Attending: Cardiovascular Disease

## 2023-08-23 ENCOUNTER — Encounter: Payer: Self-pay | Admitting: Cardiovascular Disease

## 2023-08-23 ENCOUNTER — Ambulatory Visit
Admission: RE | Admit: 2023-08-23 | Discharge: 2023-08-23 | Disposition: A | Discharge status: Home / Self Care | Attending: Cardiovascular Disease | Admitting: Cardiovascular Disease

## 2023-08-23 DIAGNOSIS — I429 Cardiomyopathy, unspecified: Secondary | ICD-10-CM | POA: Insufficient documentation

## 2023-08-23 DIAGNOSIS — I110 Hypertensive heart disease with heart failure: Secondary | ICD-10-CM | POA: Diagnosis not present

## 2023-08-23 DIAGNOSIS — I5020 Unspecified systolic (congestive) heart failure: Secondary | ICD-10-CM | POA: Diagnosis not present

## 2023-08-23 DIAGNOSIS — I4892 Unspecified atrial flutter: Secondary | ICD-10-CM | POA: Insufficient documentation

## 2023-08-23 DIAGNOSIS — Z79899 Other long term (current) drug therapy: Secondary | ICD-10-CM | POA: Diagnosis not present

## 2023-08-23 DIAGNOSIS — I5022 Chronic systolic (congestive) heart failure: Secondary | ICD-10-CM | POA: Insufficient documentation

## 2023-08-23 DIAGNOSIS — Z01812 Encounter for preprocedural laboratory examination: Secondary | ICD-10-CM | POA: Diagnosis not present

## 2023-08-23 DIAGNOSIS — I2510 Atherosclerotic heart disease of native coronary artery without angina pectoris: Secondary | ICD-10-CM | POA: Diagnosis not present

## 2023-08-23 HISTORY — PX: RIGHT/LEFT HEART CATH AND CORONARY ANGIOGRAPHY: CATH118266

## 2023-08-23 LAB — POCT I-STAT EG7
Acid-Base Excess: 4 mmol/L — ABNORMAL HIGH (ref 0.0–2.0)
Bicarbonate: 29.7 mmol/L — ABNORMAL HIGH (ref 20.0–28.0)
Calcium, Ion: 1.24 mmol/L (ref 1.15–1.40)
HCT: 37 % (ref 36.0–46.0)
Hemoglobin: 12.6 g/dL (ref 12.0–15.0)
O2 Saturation: 58 %
Potassium: 4.1 mmol/L (ref 3.5–5.1)
Sodium: 135 mmol/L (ref 135–145)
TCO2: 31 mmol/L (ref 22–32)
pCO2, Ven: 50 mmHg (ref 44–60)
pH, Ven: 7.382 (ref 7.25–7.43)
pO2, Ven: 31 mmHg — CL (ref 32–45)

## 2023-08-23 LAB — POCT I-STAT 7, (LYTES, BLD GAS, ICA,H+H)
Acid-Base Excess: 3 mmol/L — ABNORMAL HIGH (ref 0.0–2.0)
Bicarbonate: 27.1 mmol/L (ref 20.0–28.0)
Calcium, Ion: 1.22 mmol/L (ref 1.15–1.40)
HCT: 38 % (ref 36.0–46.0)
Hemoglobin: 12.9 g/dL (ref 12.0–15.0)
O2 Saturation: 98 %
Potassium: 4.3 mmol/L (ref 3.5–5.1)
Sodium: 134 mmol/L — ABNORMAL LOW (ref 135–145)
TCO2: 28 mmol/L (ref 22–32)
pCO2 arterial: 39 mmHg (ref 32–48)
pH, Arterial: 7.449 (ref 7.35–7.45)
pO2, Arterial: 95 mmHg (ref 83–108)

## 2023-08-23 LAB — GLUCOSE, CAPILLARY
Glucose-Capillary: 350 mg/dL — ABNORMAL HIGH (ref 70–99)
Glucose-Capillary: 271 mg/dL — ABNORMAL HIGH (ref 70–99)

## 2023-08-23 SURGERY — RIGHT/LEFT HEART CATH AND CORONARY ANGIOGRAPHY
Anesthesia: Moderate Sedation | Laterality: Bilateral

## 2023-08-23 MED ORDER — SODIUM CHLORIDE 0.9 % IV SOLN: INTRAVENOUS | Status: DC

## 2023-08-23 MED ORDER — LIDOCAINE HCL 1 % IJ SOLN
INTRAMUSCULAR | Status: AC
Start: 2023-08-23 — End: ?
  Filled 2023-08-23: qty 20

## 2023-08-23 MED ORDER — ONDANSETRON HCL 4 MG/2ML IJ SOLN: 4.0000 mg | Freq: Four times a day (QID) | INTRAMUSCULAR | Status: DC | PRN

## 2023-08-23 MED ORDER — HEPARIN SODIUM (PORCINE) 1000 UNIT/ML IJ SOLN
INTRAMUSCULAR | Status: DC | PRN
  Administered 2023-08-23: 3000 [IU] via INTRAVENOUS

## 2023-08-23 MED ORDER — SODIUM CHLORIDE 0.9 % IV SOLN
INTRAVENOUS | Status: DC
Start: 2023-08-23 — End: 2023-08-23

## 2023-08-23 MED ORDER — FENTANYL CITRATE (PF) 100 MCG/2ML IJ SOLN
INTRAMUSCULAR | Status: AC
Start: 2023-08-23 — End: ?
  Filled 2023-08-23: qty 2

## 2023-08-23 MED ORDER — MIDAZOLAM HCL 2 MG/2ML IJ SOLN
INTRAMUSCULAR | Status: DC | PRN
  Administered 2023-08-23 (×3): 1 mg via INTRAVENOUS

## 2023-08-23 MED ORDER — ACETAMINOPHEN 325 MG PO TABS: 650.0000 mg | ORAL_TABLET | ORAL | Status: DC | PRN

## 2023-08-23 MED ORDER — SODIUM CHLORIDE 0.9 % IV SOLN
250.0000 mL | INTRAVENOUS | Status: DC | PRN
Start: 2023-08-23 — End: 2023-08-23

## 2023-08-23 MED ORDER — HEPARIN (PORCINE) IN NACL 1000-0.9 UT/500ML-% IV SOLN
INTRAVENOUS | Status: AC
  Filled 2023-08-23: qty 1000

## 2023-08-23 MED ORDER — VERAPAMIL HCL 2.5 MG/ML IV SOLN
INTRAVENOUS | Status: AC
  Filled 2023-08-23: qty 2

## 2023-08-23 MED ORDER — SODIUM CHLORIDE 0.9% FLUSH: 3.0000 mL | INTRAVENOUS | Status: DC | PRN

## 2023-08-23 MED ORDER — HEPARIN (PORCINE) IN NACL 1000-0.9 UT/500ML-% IV SOLN
INTRAVENOUS | Status: DC | PRN
  Administered 2023-08-23: 1000 mL

## 2023-08-23 MED ORDER — MIDAZOLAM HCL 2 MG/2ML IJ SOLN
INTRAMUSCULAR | Status: AC
  Filled 2023-08-23: qty 2

## 2023-08-23 MED ORDER — HEPARIN SODIUM (PORCINE) 1000 UNIT/ML IJ SOLN
INTRAMUSCULAR | Status: AC
  Filled 2023-08-23: qty 10

## 2023-08-23 MED ORDER — LIDOCAINE HCL (PF) 1 % IJ SOLN
INTRAMUSCULAR | Status: DC | PRN
  Administered 2023-08-23: 2 mL
  Administered 2023-08-23: 10 mL

## 2023-08-23 MED ORDER — MIDAZOLAM HCL 2 MG/2ML IJ SOLN
INTRAMUSCULAR | Status: AC
Start: 2023-08-23 — End: ?
  Filled 2023-08-23: qty 2

## 2023-08-23 MED ORDER — LIDOCAINE HCL 1 % IJ SOLN
INTRAMUSCULAR | Status: AC
  Filled 2023-08-23: qty 20

## 2023-08-23 MED ORDER — IOHEXOL 300 MG/ML  SOLN
INTRAMUSCULAR | Status: DC | PRN
  Administered 2023-08-23: 50 mL

## 2023-08-23 MED ORDER — FENTANYL CITRATE (PF) 100 MCG/2ML IJ SOLN
INTRAMUSCULAR | Status: DC | PRN
  Administered 2023-08-23 (×2): 25 ug via INTRAVENOUS

## 2023-08-23 MED ORDER — SODIUM CHLORIDE 0.9% FLUSH
3.0000 mL | Freq: Two times a day (BID) | INTRAVENOUS | Status: DC
Start: 2023-08-23 — End: 2023-08-23

## 2023-08-23 MED ORDER — VERAPAMIL HCL 2.5 MG/ML IV SOLN
INTRAVENOUS | Status: DC | PRN
  Administered 2023-08-23: 2.5 mg via INTRA_ARTERIAL

## 2023-08-23 SURGICAL SUPPLY — 15 items
CATH INFINITI 5 FR JL3.5 (CATHETERS) IMPLANT
CATH INFINITI AMBI 5FR JK (CATHETERS) IMPLANT
CATH INFINITI JR4 5F (CATHETERS) IMPLANT
CATH LAUNCHER 5F EBU3.5 (CATHETERS) IMPLANT
CATH SWAN GANZ 7F STRAIGHT (CATHETERS) IMPLANT
DRAPE BRACHIAL (DRAPES) IMPLANT
GLIDESHEATH SLEND SS 6F .021 (SHEATH) IMPLANT
GUIDEWIRE EMER 3M J .025X150CM (WIRE) IMPLANT
GUIDEWIRE INQWIRE 1.5J.035X260 (WIRE) IMPLANT
KIT MICROPUNCTURE NIT STIFF (SHEATH) IMPLANT
PACK CARDIAC CATH (CUSTOM PROCEDURE TRAY) ×1 IMPLANT
SET ATX-X65L (MISCELLANEOUS) IMPLANT
SHEATH AVANTI 7FRX11 (SHEATH) IMPLANT
SHEATH GLIDE SLENDER 4/5FR (SHEATH) IMPLANT
STATION PROTECTION PRESSURIZED (MISCELLANEOUS) IMPLANT

## 2023-08-23 NOTE — Interval H&P Note (Signed)
 History and Physical Interval Note:  08/23/2023 10:03 AM  Kristy Cunningham  has presented today for surgery, with the diagnosis of R and L Cath    HF w reduced EF.  The various methods of treatment have been discussed with the patient and family. After consideration of risks, benefits and other options for treatment, the patient has consented to  Procedure(s): RIGHT/LEFT HEART CATH AND CORONARY ANGIOGRAPHY (Bilateral) as a surgical intervention.  The patient's history has been reviewed, patient examined, no change in status, stable for surgery.  I have reviewed the patient's chart and labs.  Questions were answered to the patient's satisfaction.     Ayde Record

## 2023-08-23 NOTE — Progress Notes (Signed)
Dr. Kirke Corin at bedside, speaking with pt. Re: cath results. Pt. Verbalized understanding of conversation with MD.

## 2023-08-25 ENCOUNTER — Telehealth: Payer: Self-pay | Admitting: Cardiology

## 2023-08-25 NOTE — Telephone Encounter (Signed)
 Spoke with patient who states that she is having numbness and pain in her right hand, predominately in her pointer finger, middle finger and ring finger post cath procedure.  She states that she cannot close or move fingers and that when arm  and hand in the down position, she has shooting pains to her elbow and has been keeping her arm/hand elevated at her heart.  Pt denies swelling and change in color of hand.  States hand is warm and tender to touch.  Advised patient that we will consult with Dr. Alvenia Aus for recommendations and will call her back.

## 2023-08-25 NOTE — Telephone Encounter (Signed)
 Spoke with patient after receiving Dr. Levin Reamer recommendations.  Advised patient to continue monitoring numbness in her right fingers and watch for swelling and bleeding at the site and Dr. Alvenia Aus suggested applying an ice pack to the area.  Offered to make an appointment with one of our APPs at the beginning of next week for a follow up to check site.  Pt declined appointment at this time stating that the site looked better.  Encouraged her to call our office for an appointment if she felt symptoms didn't improve.  Pt verbalized understanding.

## 2023-08-25 NOTE — Telephone Encounter (Signed)
 As long as there are no signs of swelling or bleeding from the site, some of the symptoms are expected and should gradually improve.  She should monitor her symptoms for now.  She can use ice pack.  Schedule a follow-up visit with APP early next week to check the site as well.

## 2023-08-25 NOTE — Telephone Encounter (Signed)
 Pt requesting a c/b from a nurse. Please advise

## 2023-08-25 NOTE — Telephone Encounter (Signed)
 Patient called back. She wants to know when her numbness go away. States that during her procedure the Dr hit a nerve and wants to know when th numbness will go away. Please advise

## 2023-09-10 ENCOUNTER — Emergency Department
Admission: EM | Admit: 2023-09-10 | Discharge: 2023-09-10 | Disposition: A | Discharge status: Home / Self Care | Attending: Emergency Medicine | Admitting: Emergency Medicine

## 2023-09-10 ENCOUNTER — Other Ambulatory Visit: Payer: Self-pay

## 2023-09-10 DIAGNOSIS — I509 Heart failure, unspecified: Secondary | ICD-10-CM | POA: Diagnosis not present

## 2023-09-10 DIAGNOSIS — Z91148 Patient's other noncompliance with medication regimen for other reason: Secondary | ICD-10-CM | POA: Insufficient documentation

## 2023-09-10 DIAGNOSIS — R531 Weakness: Secondary | ICD-10-CM | POA: Diagnosis present

## 2023-09-10 DIAGNOSIS — E1165 Type 2 diabetes mellitus with hyperglycemia: Secondary | ICD-10-CM | POA: Insufficient documentation

## 2023-09-10 DIAGNOSIS — R739 Hyperglycemia, unspecified: Secondary | ICD-10-CM

## 2023-09-10 LAB — COMPREHENSIVE METABOLIC PANEL WITH GFR
ALT: 23 U/L (ref 0–44)
AST: 19 U/L (ref 15–41)
Albumin: 3.9 g/dL (ref 3.5–5.0)
Alkaline Phosphatase: 96 U/L (ref 38–126)
Anion gap: 13 (ref 5–15)
BUN: 33 mg/dL — ABNORMAL HIGH (ref 6–20)
CO2: 27 mmol/L (ref 22–32)
Calcium: 9.9 mg/dL (ref 8.9–10.3)
Chloride: 93 mmol/L — ABNORMAL LOW (ref 98–111)
Creatinine, Ser: 0.83 mg/dL (ref 0.44–1.00)
GFR, Estimated: >60 mL/min (ref >60)
Glucose, Bld: 507 mg/dL (ref 70–99)
Potassium: 4.8 mmol/L (ref 3.5–5.1)
Sodium: 133 mmol/L — ABNORMAL LOW (ref 135–145)
Total Bilirubin: 1 mg/dL (ref 0.0–1.2)
Total Protein: 8.6 g/dL — ABNORMAL HIGH (ref 6.5–8.1)

## 2023-09-10 LAB — TROPONIN I (HIGH SENSITIVITY)
Troponin I (High Sensitivity): 8 ng/L (ref <18)
Troponin I (High Sensitivity): 8 ng/L (ref <18)

## 2023-09-10 LAB — CBC
HCT: 41.1 % (ref 36.0–46.0)
Hemoglobin: 13.7 g/dL (ref 12.0–15.0)
MCH: 29.8 pg (ref 26.0–34.0)
MCHC: 33.3 g/dL (ref 30.0–36.0)
MCV: 89.5 fL (ref 80.0–100.0)
Platelets: 246 10*3/uL (ref 150–400)
RBC: 4.59 MIL/uL (ref 3.87–5.11)
RDW: 13.4 % (ref 11.5–15.5)
WBC: 5.8 10*3/uL (ref 4.0–10.5)
nRBC: 0 % (ref 0.0–0.2)

## 2023-09-10 LAB — URINALYSIS, ROUTINE W REFLEX MICROSCOPIC
Bacteria, UA: NONE SEEN
Bilirubin Urine: NEGATIVE
Glucose, UA: >=500 mg/dL — AB
Hgb urine dipstick: NEGATIVE
Ketones, ur: NEGATIVE mg/dL
Nitrite: NEGATIVE
Protein, ur: NEGATIVE mg/dL
Specific Gravity, Urine: 1.021 (ref 1.005–1.030)
pH: 5 (ref 5.0–8.0)

## 2023-09-10 LAB — LIPASE, BLOOD: Lipase: 31 U/L (ref 11–51)

## 2023-09-10 LAB — RESP PANEL BY RT-PCR (RSV, FLU A&B, COVID)  RVPGX2
Influenza A by PCR: NEGATIVE
Influenza B by PCR: NEGATIVE
Resp Syncytial Virus by PCR: NEGATIVE
SARS Coronavirus 2 by RT PCR: NEGATIVE

## 2023-09-10 LAB — CBG MONITORING, ED: Glucose-Capillary: 350 mg/dL — ABNORMAL HIGH (ref 70–99)

## 2023-09-10 MED ORDER — LACTATED RINGERS IV BOLUS
500.0000 mL | Freq: Once | INTRAVENOUS | Status: AC
  Administered 2023-09-10: 500 mL via INTRAVENOUS

## 2023-09-10 MED ORDER — ONDANSETRON 4 MG PO TBDP: 4.0000 mg | ORAL_TABLET | Freq: Three times a day (TID) | ORAL | 0 refills | Status: AC | PRN

## 2023-09-10 MED ORDER — ONDANSETRON HCL 4 MG/2ML IJ SOLN
4.0000 mg | Freq: Once | INTRAMUSCULAR | Status: AC
  Administered 2023-09-10: 4 mg via INTRAVENOUS
  Filled 2023-09-10: qty 2

## 2023-09-10 MED ORDER — INSULIN ASPART 100 UNIT/ML IJ SOLN
10.0000 [IU] | Freq: Once | INTRAMUSCULAR | Status: AC
  Administered 2023-09-10: 10 [IU] via INTRAVENOUS
  Filled 2023-09-10: qty 1

## 2023-09-10 NOTE — ED Provider Notes (Signed)
 [facility] Provider Note    Event Date/Time   First MD Initiated Contact with Patient 09/10/23 1227     (approximate)   History   Weakness   HPI  Kristy Cunningham is a 53 y.o. female with CHF with EF of 40 to 45%, diabetes who comes in for generalized weakness, vomiting x 1 and noted her sugars were over 400 and not getting better with correction of her insulin.  Patient reports just some feeling more fatigued.  She does report continuing her diuretic medications and peeing more frequently and feels like she has a lot of fluid off.  She reports having an episode of vomiting without diarrhea.  She denies any chest pain, new shortness of breath, headaches, falls or hitting her head, abdominal pain or other concerns.  Patient reports that she has been feeling more weak for about a week but she had the episode of vomiting today which prompted her to come in.  She denies any additional vomiting or diarrhea.  She states that she did not take her insulin medication today.  She denies any obvious chest pain or new shortness of breath.  I reviewed where patient had a right heart cath done on 08/23/2023   Prox LAD lesion is 40% stenosed.   1st Diag lesion is 60% stenosed.   1st Mrg lesion is 30% stenosed.     Physical Exam   Triage Vital Signs: ED Triage Vitals [09/10/23 1217]  Encounter Vitals Group     BP 130/82     Girls Systolic BP Percentile      Girls Diastolic BP Percentile      Boys Systolic BP Percentile      Boys Diastolic BP Percentile      Pulse Rate (!) 105     Resp 18     Temp 98.2 F (36.8 C)     Temp Source Oral     SpO2 97 %     Weight 163 lb (73.9 kg)     Height 5' (1.524 m)     Head Circumference      Peak Flow      Pain Score 0     Pain Loc      Pain Education      Exclude from Growth Chart     Most recent vital signs: Vitals:   09/10/23 1217  BP: 130/82  Pulse: (!) 105  Resp: 18  Temp: 98.2 F (36.8 C)  SpO2: 97%      General: Awake, no distress.  CV:  Good peripheral perfusion.  Resp:  Normal effort.  Clear lungs Abd:  No distention.  Soft and nontender Other:  No swelling in legs.  No calf tenderness   ED Results / Procedures / Treatments   Labs (all labs ordered are listed, but only abnormal results are displayed) Labs Reviewed  COMPREHENSIVE METABOLIC PANEL WITH GFR - Abnormal; Notable for the following components:      Result Value   Sodium 133 (*)    Chloride 93 (*)    Glucose, Bld 507 (*)    BUN 33 (*)    Total Protein 8.6 (*)    All other components within normal limits  RESP PANEL BY RT-PCR (RSV, FLU A&B, COVID)  RVPGX2  LIPASE, BLOOD  CBC  URINALYSIS, ROUTINE W REFLEX MICROSCOPIC  TROPONIN I (HIGH SENSITIVITY)     EKG  My interpretation of EKG:  Normal sinus rate of 79 without any ST elevation  or T wave versions, normal intervals   PROCEDURES:  Critical Care performed: No  Procedures   MEDICATIONS ORDERED IN ED: Medications  insulin aspart (novoLOG) injection 10 Units (10 Units Intravenous Given 09/10/23 1314)  lactated ringers  bolus 500 mL (500 mLs Intravenous New Bag/Given 09/10/23 1309)  ondansetron  (ZOFRAN ) injection 4 mg (4 mg Intravenous Given 09/10/23 1314)     IMPRESSION / MDM / ASSESSMENT AND PLAN / ED COURSE  I reviewed the triage vital signs and the nursing notes.   Patient's presentation is most consistent with acute presentation with potential threat to life or bodily function.   Patient comes in with some increasing weakness concerns for elevated sugars.  Workup was done evaluate for AKI, DKA, ACS.  Abdomen soft and nontender low suspicion for acute abdominal process  CBC reassuring lipase normal CMP shows elevated sugar of 507 without evidence of anion gap.  Initial troponin negative as been ongoing for some time now.  1:47 PM reevaluated patient she is feeling better.  Abdomen remains soft and nontender.  We discussed patient's elevated  sugar and she does report that she did not take her insulin today she supposed to get at least twice daily.  We discussed the importance of insulin and that if her sugars are still running high without taking her insulin that that she may need to have her medications adjusted further.  She expressed understanding.  She will need to follow-up with PCP.  3:13 PM she reports that she continues to feel much better than previous.  She denies any overt urinary symptoms.  She does report a little bit of increased urination but she attributes that to her Lasix  she did not want to start antibiotics and prefer to wait for culture given her increased urination is probably more likely from her Lasix  use than UTI  At this time patient feels comfortable with discharge home and return to the ER for worsening symptoms or any other concerns  The patient is on the cardiac monitor to evaluate for evidence of arrhythmia and/or significant heart rate changes.      FINAL CLINICAL IMPRESSION(S) / ED DIAGNOSES   Final diagnoses:  Hyperglycemia  Weakness  Non compliance w medication regimen     Rx / DC Orders   ED Discharge Orders     None        Note:  This document was prepared using Dragon voice recognition software and may include unintentional dictation errors.   Lubertha Rush, MD 09/10/23 [phone]

## 2023-09-10 NOTE — ED Triage Notes (Signed)
 Patient states generalized weakness, vomited x 1 and states her sugar was 400. Has not taken any of her insulin today to correct her sugar.

## 2023-09-10 NOTE — ED Provider Notes (Incomplete)
   [facility] Provider Note    Event Date/Time   First MD Initiated Contact with Patient 09/10/23 1227     (approximate)   History   Weakness   HPI  Kristy Cunningham is a 53 y.o. female   Physical Exam   Triage Vital Signs: ED Triage Vitals [09/10/23 1217]  Encounter Vitals Group     BP 130/82     Girls Systolic BP Percentile      Girls Diastolic BP Percentile      Boys Systolic BP Percentile      Boys Diastolic BP Percentile      Pulse Rate (!) 105     Resp 18     Temp 98.2 F (36.8 C)     Temp Source Oral     SpO2 97 %     Weight 163 lb (73.9 kg)     Height 5' (1.524 m)     Head Circumference      Peak Flow      Pain Score 0     Pain Loc      Pain Education      Exclude from Growth Chart     Most recent vital signs: Vitals:   09/10/23 1217  BP: 130/82  Pulse: (!) 105  Resp: 18  Temp: 98.2 F (36.8 C)  SpO2: 97%     General: Awake, no distress.  CV:  Good peripheral perfusion.  Resp:  Normal effort.  Abd:  No distention.  Other:     ED Results / Procedures / Treatments   Labs (all labs ordered are listed, but only abnormal results are displayed) Labs Reviewed  LIPASE, BLOOD  COMPREHENSIVE METABOLIC PANEL WITH GFR  CBC  URINALYSIS, ROUTINE W REFLEX MICROSCOPIC     EKG  My interpretation of EKG:    RADIOLOGY I have reviewed the xray personally and interpreted    PROCEDURES:  Critical Care performed: {CriticalCareYesNo:19197::Yes, see critical care procedure note(s),No}  Procedures   MEDICATIONS ORDERED IN ED: Medications - No data to display   IMPRESSION / MDM / ASSESSMENT AND PLAN / ED COURSE  I reviewed the triage vital signs and the nursing notes.   Patient's presentation is most consistent with {EM COPA:27473}     The patient is on the cardiac monitor to evaluate for evidence of arrhythmia and/or significant heart rate changes.      FINAL CLINICAL IMPRESSION(S) / ED DIAGNOSES    Final diagnoses:  None     Rx / DC Orders   ED Discharge Orders     None        Note:  This document was prepared using Dragon voice recognition software and may include unintentional dictation errors.

## 2023-09-10 NOTE — ED Notes (Signed)
 Pt ambulatory to the bathroom at this time. Pt given urine cup for urine sample.

## 2023-09-10 NOTE — Discharge Instructions (Addendum)
 It is important that you take your diabetes medicine as this can cause elevated sugars and you to be dehydrated.  Return to the ER if develop worsening symptoms or any other concerns.  Call your primary care doctor to discuss your insulin medications as you may need adjustment if your sugars still run high when taking your medication

## 2023-09-12 LAB — URINE CULTURE: Culture: >=100000 — AB

## 2023-10-04 ENCOUNTER — Ambulatory Visit: Attending: Cardiology | Admitting: Cardiology

## 2023-10-31 NOTE — Progress Notes (Unsigned)
 PROVIDER NOTE: Interpretation of information contained herein should be left to medically-trained personnel. Specific patient instructions are provided elsewhere under Patient Instructions section of medical record. This document was created in part using AI and STT-dictation technology, any transcriptional errors that may result from this process are unintentional.  Patient: Kristy Cunningham  Service: E/M   PCP: Era Raisin, NP  DOB: 1970-06-23  DOS: 11/01/2023  Provider: Eric DELENA Como, MD  MRN: [id number]  Delivery: Face-to-face  Specialty: Interventional Pain Management  Type: Established Patient  Setting: Ambulatory outpatient facility  Specialty designation: 09  Referring Prov.: Era Raisin, NP  Location: Outpatient office facility       History of present illness (HPI) Kristy Cunningham, a 53 y.o. year old female, is here today because of her Chronic pain of left lower extremity [M79.605, G89.29]. Ms. Eberwein's primary complain today is No chief complaint on file.  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-4, L4-5); Chronic LLQ pain; Lumbar spondylosis; Chronic sacroiliac joint pain (Left); Swelling of lower leg; Chronic pain syndrome; Abnormal MRI, lumbar spine (11/30/2022) ([facility]); Chronic foot pain (2ry area of Pain) (Left); Chronic knee pain (3ry area of Pain) (Left); Spasm of muscle of lower back; Muscle spasms of lower extremity (Left); Fasciculations of muscle (hamstring) (Left); Allodynia (foot) (Left); Swelling of foot (Left); Lumbar intervertebral disc protrusion (Right: L2-3) (Left: L3-4); Displacement of lumbar intervertebral disc; Chronic lower extremity pain (1ry area of Pain) (Left); 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  Pain Assessment: Severity  of   is reported as a  /10. Location:    / . Onset:  . Quality:  . Timing:  . Modifying factor(s):  SABRA Vitals:  vitals were not taken for this visit.  BMI: Estimated body mass index is 31.83 kg/m as calculated from the following:   Height as of 09/10/23: 5' (1.524 m).   Weight as of 09/10/23: 163 lb (73.9 kg).  Last encounter: 07/26/2023. Last procedure: 05/25/2023.  Reason for encounter: evaluation of worsening, or previously known (established) problem.   Discussed the use of AI scribe software for clinical note transcription with the patient, who gave verbal consent to proceed.  History of Present Illness           Pharmacotherapy Assessment   No chronic opioid analgesics therapy prescribed by our practice. None MME/day: 0 mg/day   Monitoring: [place] PMP: PDMP reviewed during this encounter.       Pharmacotherapy: No side-effects or adverse reactions reported. Compliance: No problems identified. Effectiveness: Clinically acceptable.  No notes on file  UDS:  Summary  Date Value Ref Range Status  02/08/2023 Note  Final    Comment:    ==================================================================== Compliance Drug Analysis, Ur ==================================================================== Specimen Alert Not Detected result may be consistent with the time of last use noted for this medication. AS NEEDED (Oxycodone , Tramadol) ==================================================================== Test                             Result       Flag       Units  Drug Present and Declared for Prescription Verification   Desmethylcyclobenzaprine       PRESENT      EXPECTED    Desmethylcyclobenzaprine is an expected metabolite of    cyclobenzaprine.  Sertraline                     PRESENT      EXPECTED   Desmethylsertraline            PRESENT      EXPECTED    Desmethylsertraline is an expected metabolite of sertraline.  Drug Absent but Declared for Prescription  Verification   Oxycodone                       Not Detected UNEXPECTED ng/mg creat   Tramadol                       Not Detected UNEXPECTED ng/mg creat   Gabapentin                     Not Detected UNEXPECTED   Tizanidine                     Not Detected UNEXPECTED    Tizanidine, as indicated in the declared medication list, is not    always detected even when used as directed.    Acetaminophen                   Not Detected UNEXPECTED    Acetaminophen , as indicated in the declared medication list, is not    always detected even when used as directed.    Diclofenac                     Not Detected UNEXPECTED    Diclofenac, as indicated in the declared medication list, is not    always detected even when used as directed.    Naproxen                        Not Detected UNEXPECTED ==================================================================== Test                      Result    Flag   Units      Ref Range   Creatinine              43               mg/dL      >=79 ==================================================================== Declared Medications:  The flagging and interpretation on this report are based on the  following declared medications.  Unexpected results may arise from  inaccuracies in the declared medications.   **Note: The testing scope of this panel includes these medications:   Cyclobenzaprine (Flexeril)  Gabapentin (Neurontin)  Naproxen  (Naprosyn )  Oxycodone   Sertraline (Zoloft)  Tramadol (Ultram)   **Note: The testing scope of this panel does not include small to  moderate amounts of these reported medications:   Acetaminophen   Diclofenac (Voltaren)  Tizanidine (Zanaflex)   **Note: The testing scope of this panel does not include the  following reported medications:   Azithromycin  (Zithromax )  Bisacodyl  Cephalexin  Clotrimazole (Lotrimin)  Dicyclomine (Bentyl)  Docusate (Colace)  Dulaglutide (Trulicity)  Empagliflozin  (Jardiance )  Fluconazole  (Diflucan)  Fluticasone (Flonase)  Hydrochlorothiazide (Hyzaar)  Insulin  (Lantus)  Losartan  (Cozaar )  Losartan  (Hyzaar)  Meloxicam (Mobic)  Metformin (Janumet)  Omeprazole (Prilosec)  Ondansetron  (Zofran )  Polyethylene Glycol  Prednisone  (Deltasone )  Sitagliptin (Janumet)  Sulfamethoxazole (Bactrim)  Supplement  Topical  Trimethoprim (Bactrim) ==================================================================== For clinical consultation, please call [phone]. ====================================================================     No results  found for: CBDTHCR No results found for: D8THCCBX No results found for: D9THCCBX  ROS  Constitutional: Denies any fever or chills Gastrointestinal: No reported hemesis, hematochezia, vomiting, or acute GI distress Musculoskeletal: Denies any acute onset joint swelling, redness, loss of ROM, or weakness Neurological: No reported episodes of acute onset apraxia, aphasia, dysarthria, agnosia, amnesia, paralysis, loss of coordination, or loss of consciousness  Medication Review  DULoxetine, Glucose Blood, Insulin  Pen Needle, aspirin EC, dapagliflozin  propanediol, docusate sodium, fluticasone, furosemide , glucose blood, insulin  glargine, metoprolol  succinate, omeprazole, sacubitril -valsartan , spironolactone , and tiZANidine  History Review  Allergy: Ms. Miguez is allergic to keflex [cephalexin] and jardiance  [empagliflozin ]. Drug: Ms. Pair  reports no history of drug use. Alcohol:  reports no history of alcohol use. Tobacco:  reports that she has never smoked. She has never used smokeless tobacco. Social: Ms. Heinkel  reports that she has never smoked. She has never used smokeless tobacco. She reports that she does not drink alcohol and does not use drugs. Medical:  has a past medical history of Depression, Diabetes mellitus without complication (HCC), Hypertension, Low back strain (10/26/2022), and Pedal edema. Surgical:  Ms. Molloy  has a past surgical history that includes RIGHT/LEFT HEART CATH AND CORONARY ANGIOGRAPHY (Bilateral, 08/23/2023). Family: family history includes Breast cancer in her paternal grandmother.  Laboratory Chemistry Profile   Renal Lab Results  Component Value Date   BUN 33 (H) 09/10/2023   CREATININE 0.83 09/10/2023   BCR 31 (H) 08/02/2023   GFRNONAA >60 09/10/2023    Hepatic Lab Results  Component Value Date   AST 19 09/10/2023   ALT 23 09/10/2023   ALBUMIN 3.9 09/10/2023   ALKPHOS 96 09/10/2023   LIPASE 31 09/10/2023    Electrolytes Lab Results  Component Value Date   NA 133 (L) 09/10/2023   K 4.8 09/10/2023   CL 93 (L) 09/10/2023   CALCIUM 9.9 09/10/2023   MG 2.1 02/08/2023    Bone Lab Results  Component Value Date   25OHVITD1 32 02/08/2023   25OHVITD2 <1.0 02/08/2023   25OHVITD3 32 02/08/2023    Inflammation (CRP: Acute Phase) (ESR: Chronic Phase) Lab Results  Component Value Date   CRP 2 02/08/2023   ESRSEDRATE 68 (H) 02/08/2023   LATICACIDVEN 1.8 02/27/2021         Note: Above Lab results reviewed.  Recent Imaging Review  CARDIAC CATHETERIZATION   Prox LAD lesion is 40% stenosed.   1st Diag lesion is 60% stenosed.   1st Mrg lesion is 30% stenosed.  1.  Moderate one-vessel coronary artery disease involving the bifurcation  of proximal LAD and first diagonal.  No evidence of obstructive disease 2.  Left ventricular angiography was not performed.  EF was mildly reduced  by echo. 3.  Right heart catheterization showed normal RA pressure, high normal  pulmonary pressure, mildly elevated wedge pressure at 40 mmHg and  moderately reduced cardiac output. 4.  Difficult catheterization via the right radial artery due to tortuous  innominate artery and small size aortic root.  Right heart catheterization  was performed via the right femoral vein due to lack of good veins in the  right upper extremity.  RA: 7 mmHg, RV: 30/9 mmHg, PA: 32/14 with a  mean of 21 mmHg, PW: 14 mmHg,  cardiac output is 3.27 with an index of 1.91.  Recommendations: Recommend medical therapy for nonobstructive coronary artery disease. Continue heart failure management. Note: Reviewed        Physical Exam  Vitals: LMP 05/11/2016 (Approximate)  BMI: Estimated body mass index is 31.83 kg/m as calculated from the following:   Height as of 09/10/23: 5' (1.524 m).   Weight as of 09/10/23: 163 lb (73.9 kg). Ideal: Patient weight not recorded General appearance: Well nourished, well developed, and well hydrated. In no apparent acute distress Mental status: Alert, oriented x 3 (person, place, & time)       Respiratory: No evidence of acute respiratory distress Eyes: PERLA   Assessment   Diagnosis Status  1. Chronic lower extremity pain (Left)   2. Lumbar radiculopathy (Left)   3. 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  Plan of Care  Problem-specific:  Assessment and Plan            Ms. CALYSSA ZOBRIST has a current medication list which includes the following long-term medication(s): duloxetine, fluticasone, furosemide , lantus solostar, metoprolol  succinate, omeprazole, and spironolactone .  Pharmacotherapy (Medications Ordered): No orders of the defined types were placed in this encounter.  Orders:  No orders of the defined types were placed in this encounter.    Interventional Therapies  Risk Factors  Considerations  Medical Comorbidities:     Planned  Pending:   Therapeutic left L3-4 LESI #2    Under consideration:   Diagnostic/therapeutic left-sided L3-4 LESI #2    Completed:   Diagnostic/therapeutic left-sided L3-4 LESI x1 (03/02/2023) (5-0/10) (   Therapeutic  Palliative (PRN) options:   None established   Completed by other providers:   (01/18/2023) diagnostic left sacroiliac joint injection by  [facility] (12/14/2022) lower extremity EMG by [facility]     No follow-ups on file.    Recent Visits No visits were found meeting these conditions. Showing recent visits within past 90 days and meeting all other requirements Future Appointments Date Type Provider Dept  11/01/23 Appointment Tanya Glisson, MD [facility]  Showing future appointments within next 90 days and meeting all other requirements  I discussed the assessment and treatment plan with the patient. The patient was provided an opportunity to ask questions and all were answered. The patient agreed with the plan and demonstrated an understanding of the instructions.  Patient advised to call back or seek an in-person evaluation if the symptoms or condition worsens.  Duration of encounter: *** minutes.  Total time on encounter, as per AMA guidelines included both the face-to-face and non-face-to-face time personally spent by the physician and/or other qualified health care professional(s) on the day of the encounter (includes time in activities that require the physician or other qualified health care professional and does not include time in activities normally performed by clinical staff). Physician's time may include the following activities when performed: Preparing to see the patient (e.g., pre-charting review of records, searching for previously ordered imaging, lab work, and nerve conduction tests) Review of prior analgesic pharmacotherapies. Reviewing PMP Interpreting ordered tests (e.g., lab work, imaging, nerve conduction tests) Performing post-procedure evaluations, including interpretation of diagnostic procedures Obtaining and/or reviewing separately obtained history Performing a medically appropriate examination and/or evaluation Counseling and educating the patient/family/caregiver Ordering medications, tests, or procedures Referring and communicating with other health care professionals (when  not separately reported) Documenting clinical information in the electronic or other health record Independently interpreting results (not separately reported) and communicating results to the patient/ family/caregiver Care coordination (not separately reported)  Note by: Glisson DELENA Tanya, MD (TTS and AI technology used. I apologize for any  typographical errors that were not detected and corrected.) Date: 11/01/2023; Time: 7:45 AM

## 2023-11-01 ENCOUNTER — Encounter: Payer: Self-pay | Admitting: Pain Medicine

## 2023-11-01 ENCOUNTER — Ambulatory Visit: Attending: Pain Medicine | Admitting: Pain Medicine

## 2023-11-01 VITALS — BP 153/83 | Temp 97.4°F | Ht 60.0 in | Wt 163.0 lb

## 2023-11-01 DIAGNOSIS — M7061 Trochanteric bursitis, right hip: Secondary | ICD-10-CM | POA: Insufficient documentation

## 2023-11-01 DIAGNOSIS — M5416 Radiculopathy, lumbar region: Secondary | ICD-10-CM | POA: Insufficient documentation

## 2023-11-01 DIAGNOSIS — E1140 Type 2 diabetes mellitus with diabetic neuropathy, unspecified: Secondary | ICD-10-CM | POA: Diagnosis present

## 2023-11-01 DIAGNOSIS — M7062 Trochanteric bursitis, left hip: Secondary | ICD-10-CM | POA: Diagnosis present

## 2023-11-01 DIAGNOSIS — M79605 Pain in left leg: Secondary | ICD-10-CM | POA: Diagnosis not present

## 2023-11-01 DIAGNOSIS — Z794 Long term (current) use of insulin: Secondary | ICD-10-CM | POA: Insufficient documentation

## 2023-11-01 DIAGNOSIS — G8929 Other chronic pain: Secondary | ICD-10-CM | POA: Insufficient documentation

## 2023-11-01 DIAGNOSIS — M5442 Lumbago with sciatica, left side: Secondary | ICD-10-CM | POA: Diagnosis not present

## 2023-11-01 DIAGNOSIS — E1142 Type 2 diabetes mellitus with diabetic polyneuropathy: Secondary | ICD-10-CM | POA: Insufficient documentation

## 2023-11-01 DIAGNOSIS — E119 Type 2 diabetes mellitus without complications: Secondary | ICD-10-CM | POA: Diagnosis not present

## 2023-11-01 NOTE — Progress Notes (Signed)
 Safety precautions to be maintained throughout the outpatient stay will include: orient to surroundings, keep bed in low position, maintain call bell within reach at all times, provide assistance with transfer out of bed and ambulation.

## 2023-11-01 NOTE — Patient Instructions (Addendum)
 Preparing for Procedure with Sedation Instructions: Oral Intake: Do not eat or drink anything for at least 8 hours prior to your procedure. Transportation: Public transportation is not allowed. Bring an adult driver. The driver must be physically present in our waiting room before any procedure can be started. Physical Assistance: Bring an adult capable of physically assisting you, in the event you need help. Blood Pressure Medicine: Take your blood pressure medicine with a sip of water the morning of the procedure. Insulin : Take only  of your normal insulin  dose. Preventing infections: Shower with an antibacterial soap the morning of your procedure. Build-up your immune system: Take 1000 mg of Vitamin C with every meal (3 times a day) the day prior to your procedure. Pregnancy: If you are pregnant, call and cancel the procedure. Sickness: If you have a cold, fever, or any active infections, call and cancel the procedure. Arrival: You must be in the facility at least 30 minutes prior to your scheduled procedure. Children: Do not bring children with you. Dress appropriately: Bring dark clothing that you would not mind if they get stained. Valuables: Do not bring any jewelry or valuables. Procedure appointments are reserved for interventional treatments only. No Prescription Refills. No medication changes will be discussed during procedure appointments. No disability issues will be discussed.  ______________________________________________________________________    Muscle Spasms & Cramps  Cause(s):  Most common - vitamin and/or electrolyte (calcium, potassium, sodium, etc.) deficiencies. Post procedure - steroids (injected, oral, or inhaled) can make your kidneys excrete (loose) electrolytes. Most of the time this will not cause any symptoms however, if you happen to be borderline low on your electrolytes, it may temporarily triggering cramps & spasms.  Possible triggers: Sweating -  causes loss of electrolytes thru the skin. Steroids - causes loss of electrolytes thru the urine.  Treatment: (over-the-counter)  Gatorade (or any other electrolyte-replenishing drink) - Take 1, 8 oz glass with each meal (3 times a day). Mechanism of action: Replenishes lost electrolytes. Magnesium 400 to 500 mg - Take 1 tablet twice a day (one with breakfast and one at bedtime). If you have kidney disease talk to your primary care physician before taking any Magnesium. Mechanism of action: Magnesium is a natural muscle relaxant. Tonic Water with quinine - Take 1, 8 oz glass before bedtime.  Mechanism of action: Quinine is used to treat spasms.  Last Update: 10/06/2022  ______________________________________________________________________    ______________________________________________________________________    TENS (Device can be purchased online, without prescription. Search: TENS 7000.) Transcutaneous electrical nerve stimulation (TENS) is a method of pain relief that involves the use of mild electrical stimulation. A TENS machine is a small, battery-operated device that has leads connected to sticky pads called electrodes. Available at [employer]. (Estimated price as of July 10th, 2025.)  (Estimated [employer] cost: $38.88) Rechargeable 9V batteries:  (Estimated [employer] cost: $12.98)  Larger Reusable 2 x 4 TENS Pads/Electrodes:  (Estimated [employer] cost: $9.99)  Total cost: $61.85    ELECTRODE PLACEMENT:   TENS UNIT SAFETY WARNING SHEET and INFORMATION INDICATIONS AND CONTRAINDICTIONS Read the operation manual before using the device. Freight forwarder ([place] ) restricts this device to sale by or on the order of a physician. Observe your physician's precise instructions and let him show you where to apply the electrodes. For a successful therapy, the correct application of the electrodes is an important factor. Carefully write down the settings your physician recommended. Indications for use  This device is a prescription device and only for symptomatic relief of  chronic intractable pain.  Contraindications:   Any electrode placement that applies current to the carotid sinus (neck) region.   Patients with implanted electronic devices (for example, a pacemaker) or metallic implants should not undertake.   Any electrode placement that causes current to flow transcerebrally (through the head). The use of unit whenever pain symptoms are undiagnosed, unit etiology is determined.   The use of TENS whenever pain syndromes are undiagnosed, until etiology is established.   WARNINGS AND PRECAUTIONS  Warnings:   The device must be kept out of reach of children.   The safety of device for use during pregnancy or delivery has not been established.   Do not place electrodes on front of the throat. This may result in spasms of the laryngeal and pharyngeal muscles.   Do not place the electrodes over the carotid nerve (side of neck below ear).   The device is not effective for pain of central origin (headaches).   The device may interfere with electronic monitoring equipment (such as ECG monitors and ECG alarms).   Electrodes should not be placed over the eyes, in the mouth, or internally.   These devices have no curative value.   TENS devices should be used only under the continued supervision of a physician.   TENS is a symptomatic treatment and as such suppresses the sensation of pain which would otherwise serve as a protective mechanism. Precautions/Adverse Reactions   Isolated cases of skin irritation may occur at the site of electrode placement following long-term application.   Stimulation should be stopped and electrodes removed until the cause of the irritation can be determined.   Effectiveness is highly dependent upon patient selection by a person qualified in the management of pain patients.   If the device treatment becomes ineffective or unpleasant, stimulation should be discontinued until reevaluation  by a physician/clinician.   Always turn the device off before applying or removing electrodes.   Skin irritation and electrode burns are potential adverse reactions.  PURPOSE: A Transcutaneous Electrical Nerve Stimulator, or TENS, unit is designed to relieve post-operative, acute and chronic pain. It is used for pain caused by peripheral nerves and not central. TENS units are prescription-only devices.   OPERATION: TENS units work in a couple of ways. The first way they are thought to work is by a method called the [employer]. The [employer] states that our brains can only handle one stimulus at a time. When you have chronic pain, this pain signal is constantly being sent to your brain and recognized as pain. When an electrical stimulus is added to the area of pain the body feels this electrical stimulus, and since the brain can only handle one thing at a time, the pain is not transmitted to the brain. The second method thought to be part of TENS unit's success is by way of stimulating our own bodies to release their own natural painkillers. TENS units do not work for everyone and results may vary. Always follow the instructions and warnings in your user's manual.   USE: One of the most important tasks that must be performed is battery maintenance. If you are using a Engineering geologist, always fully charge it and fully deplete it before charging it again. These batteries can develop memories and by not performing this charging task correctly, your battery's life can be greatly diminished. If your battery does develop a memory you can help expand the memory by charging for 12 - 13 hours and then  completely depleting the battery. Always prepare the skin before applying electrodes. Your skin should be clean and free of any lotions or creams. If you are using electrodes that use conductive gel, apply a small, even layer over the electrode. For carbon, self-adhesive electrodes, apply a drop of  water to the electrodes before applying to the skin. The electrodes attach to the lead wires and then the TENS unit. Always grasp the connector and not the cord when inserting or removing. When making adjustments, always make sure the unit's channels (1 and 2) are in the OFF position. The actual settings should be recommended and prescribed by your physician. Medical equipment suppliers don'tset or instruct users as to user settings. When you are using the BURST mode, the unit delivers a series of quick pulses followed by a rest. This cycle repeats itself frequently. Always have channels OFF before changing modes.   For MODULATION mode, the stimulation automatically varies the width of the pulse.   For CONVENTIONAL mode, the stimulation is constant. After the settings have been fine-tuned, set the timer to 30 or 60 minutes. Your physician should also prescribe the use time. When the lights become dim, it means your batteries should be replaced or recharged.   ACCESSORIES: The electrodes and lead wires can be obtained from your medical equipment supplier. Your medical equipment supplier can set up a recurring delivery to accommodate your needs. Electrodes should be replaced once a month and lead wires once every 6 months.  Video Tutorial https://[email or web address].be/[email or web address]  ______________________________________________________________________     ______________________________________________________________________    Procedure instructions  Stop blood-thinners  Do not eat or drink fluids (other than water) for 6 hours before your procedure  No water for 2 hours before your procedure  Take your blood pressure medicine with a sip of water  Arrive 30 minutes before your appointment  If sedation is planned, bring suitable driver. [place], [place], & public transportation are NOT APPROVED)  Carefully read the Preparing for your procedure detailed instructions  If you have  questions call us  at [phone]  Procedure appointments are for procedures only.   NO medication refills or new problem evaluations will be done on procedure days.   Only the scheduled, pre-approved procedure and side will be done.   ______________________________________________________________________      ______________________________________________________________________    Preparing for your procedure  Appointments: If you think you may not be able to keep your appointment, call 24-48 hours in advance to cancel. We need time to make it available to others.  Procedure visits are for procedures only. During your procedure appointment there will be: NO Prescription Refills*. NO medication changes or discussions*. NO discussion of disability issues*. NO unrelated pain problem evaluations*. NO evaluations to order other pain procedures*. *These will be addressed at a separate and distinct evaluation encounter on the provider's evaluation schedule and not during procedure days.  Instructions: Food intake: Avoid eating anything solid for at least 8 hours prior to your procedure. Clear liquid intake: You may take clear liquids such as water up to 2 hours prior to your procedure. (No carbonated drinks. No soda.) Transportation: Unless otherwise stated by your physician, bring a driver. (Driver cannot be a Market researcher, Pharmacist, community, or any other form of public transportation.) Morning Medicines: Except for blood thinners, take all of your other morning medications with a sip of water. Make sure to take your heart and blood pressure medicines. If your blood pressure's lower number is above 100, the case will be rescheduled.  Blood thinners: Make sure to stop your blood thinners as instructed.  If you take a blood thinner, but were not instructed to stop it, call our office [phone] and ask to talk to a nurse. Not stopping a blood thinner prior to certain procedures could lead to serious  complications. Diabetics on insulin : Notify the staff so that you can be scheduled 1st case in the morning. If your diabetes requires high dose insulin , take only  of your normal insulin  dose the morning of the procedure and notify the staff that you have done so. Preventing infections: Shower with an antibacterial soap the morning of your procedure.  Build-up your immune system: Take 1000 mg of Vitamin C with every meal (3 times a day) the day prior to your procedure. Antibiotics: Inform the nursing staff if you are taking any antibiotics or if you have any conditions that may require antibiotics prior to procedures. (Example: recent joint implants)   Pregnancy: If you are pregnant make sure to notify the nursing staff. Not doing so may result in injury to the fetus, including death.  Sickness: If you have a cold, fever, or any active infections, call and cancel or reschedule your procedure. Receiving steroids while having an infection may result in complications. Arrival: You must be in the facility at least 30 minutes prior to your scheduled procedure. Tardiness: Your scheduled time is also the cutoff time. If you do not arrive at least 15 minutes prior to your procedure, you will be rescheduled.  Children: Do not bring any children with you. Make arrangements to keep them home. Dress appropriately: There is always a possibility that your clothing may get soiled. Avoid long dresses. Valuables: Do not bring any jewelry or valuables.  Reasons to call and reschedule or cancel your procedure: (Following these recommendations will minimize the risk of a serious complication.) Surgeries: Avoid having procedures within 2 weeks of any surgery. (Avoid for 2 weeks before or after any surgery). Flu Shots: Avoid having procedures within 2 weeks of a flu shots or . (Avoid for 2 weeks before or after immunizations). Barium: Avoid having a procedure within 7-10 days after having had a radiological study  involving the use of radiological contrast. (Myelograms, Barium swallow or enema study). Heart attacks: Avoid any elective procedures or surgeries for the initial 6 months after a Myocardial Infarction (Heart Attack). Blood thinners: It is imperative that you stop these medications before procedures. Let us  know if you if you take any blood thinner.  Infection: Avoid procedures during or within two weeks of an infection (including chest colds or gastrointestinal problems). Symptoms associated with infections include: Localized redness, fever, chills, night sweats or profuse sweating, burning sensation when voiding, cough, congestion, stuffiness, runny nose, sore throat, diarrhea, nausea, vomiting, cold or Flu symptoms, recent or current infections. It is specially important if the infection is over the area that we intend to treat. Heart and lung problems: Symptoms that may suggest an active cardiopulmonary problem include: cough, chest pain, breathing difficulties or shortness of breath, dizziness, ankle swelling, uncontrolled high or unusually low blood pressure, and/or palpitations. If you are experiencing any of these symptoms, cancel your procedure and contact your primary care physician for an evaluation.  Remember:  Regular Business hours are:  Monday to Thursday 8:00 AM to 4:00 PM  Provider's Schedule: Eric Como, MD:  Procedure days: Tuesday and Thursday 7:30 AM to 4:00 PM  Wallie Sherry, MD:  Procedure days: Monday and Wednesday 7:30 AM to 4:00  PM Last  Updated: 03/07/2023 ______________________________________________________________________      ______________________________________________________________________    General Risks and Possible Complications  Patient Responsibilities: It is important that you read this as it is part of your informed consent. It is our duty to inform you of the risks and possible complications associated with treatments offered to you. It  is your responsibility as a patient to read this and to ask questions about anything that is not clear or that you believe was not covered in this document.  Patient's Rights: You have the right to refuse treatment. You also have the right to change your mind, even after initially having agreed to have the treatment done. However, under this last option, if you wait until the last second to change your mind, you may be charged for the materials used up to that point.  Introduction: Medicine is not an Visual merchandiser. Everything in Medicine, including the lack of treatment(s), carries the potential for danger, harm, or loss (which is by definition: Risk). In Medicine, a complication is a secondary problem, condition, or disease that can aggravate an already existing one. All treatments carry the risk of possible complications. The fact that a side effects or complications occurs, does not imply that the treatment was conducted incorrectly. It must be clearly understood that these can happen even when everything is done following the highest safety standards.  No treatment: You can choose not to proceed with the proposed treatment alternative. The "PRO(s)" would include: avoiding the risk of complications associated with the therapy. The "CON(s)" would include: not getting any of the treatment benefits. These benefits fall under one of three categories: diagnostic; therapeutic; and/or palliative. Diagnostic benefits include: getting information which can ultimately lead to improvement of the disease or symptom(s). Therapeutic benefits are those associated with the successful treatment of the disease. Finally, palliative benefits are those related to the decrease of the primary symptoms, without necessarily curing the condition (example: decreasing the pain from a flare-up of a chronic condition, such as incurable terminal cancer).  General Risks and Complications: These are associated to most interventional  treatments. They can occur alone, or in combination. They fall under one of the following six (6) categories: no benefit or worsening of symptoms; bleeding; infection; nerve damage; allergic reactions; and/or death. No benefits or worsening of symptoms: In Medicine there are no guarantees, only probabilities. No healthcare provider can ever guarantee that a medical treatment will work, they can only state the probability that it may. Furthermore, there is always the possibility that the condition may worsen, either directly, or indirectly, as a consequence of the treatment. Bleeding: This is more common if the patient is taking a blood thinner, either prescription or over the counter (example: Goody Powders, Fish oil, Aspirin, Garlic, etc.), or if suffering a condition associated with impaired coagulation (example: Hemophilia, cirrhosis of the liver, low platelet counts, etc.). However, even if you do not have one on these, it can still happen. If you have any of these conditions, or take one of these drugs, make sure to notify your treating physician. Infection: This is more common in patients with a compromised immune system, either due to disease (example: diabetes, cancer, human immunodeficiency virus [HIV], etc.), or due to medications or treatments (example: therapies used to treat cancer and rheumatological diseases). However, even if you do not have one on these, it can still happen. If you have any of these conditions, or take one of these drugs, make sure to notify your  treating physician. Nerve Damage: This is more common when the treatment is an invasive one, but it can also happen with the use of medications, such as those used in the treatment of cancer. The damage can occur to small secondary nerves, or to large primary ones, such as those in the spinal cord and brain. This damage may be temporary or permanent and it may lead to impairments that can range from temporary numbness to permanent  paralysis and/or brain death. Allergic Reactions: Any time a substance or material comes in contact with our body, there is the possibility of an allergic reaction. These can range from a mild skin rash (contact dermatitis) to a severe systemic reaction (anaphylactic reaction), which can result in death. Death: In general, any medical intervention can result in death, most of the time due to an unforeseen complication. ______________________________________________________________________

## 2023-11-28 ENCOUNTER — Encounter: Payer: Self-pay | Admitting: Pain Medicine

## 2023-11-28 ENCOUNTER — Ambulatory Visit
Admission: RE | Admit: 2023-11-28 | Discharge: 2023-11-28 | Disposition: A | Discharge status: Home / Self Care | Source: Ambulatory Visit | Attending: Pain Medicine | Admitting: Pain Medicine

## 2023-11-28 ENCOUNTER — Ambulatory Visit (HOSPITAL_BASED_OUTPATIENT_CLINIC_OR_DEPARTMENT_OTHER): Admitting: Pain Medicine

## 2023-11-28 VITALS — BP 144/90 | HR 76 | Temp 97.1°F | Resp 14 | Ht 60.0 in | Wt 156.0 lb

## 2023-11-28 DIAGNOSIS — G8929 Other chronic pain: Secondary | ICD-10-CM | POA: Insufficient documentation

## 2023-11-28 DIAGNOSIS — R937 Abnormal findings on diagnostic imaging of other parts of musculoskeletal system: Secondary | ICD-10-CM | POA: Diagnosis present

## 2023-11-28 DIAGNOSIS — M62838 Other muscle spasm: Secondary | ICD-10-CM | POA: Insufficient documentation

## 2023-11-28 DIAGNOSIS — M4316 Spondylolisthesis, lumbar region: Secondary | ICD-10-CM

## 2023-11-28 DIAGNOSIS — M5416 Radiculopathy, lumbar region: Secondary | ICD-10-CM | POA: Diagnosis present

## 2023-11-28 DIAGNOSIS — M25562 Pain in left knee: Secondary | ICD-10-CM | POA: Diagnosis present

## 2023-11-28 DIAGNOSIS — M5126 Other intervertebral disc displacement, lumbar region: Secondary | ICD-10-CM | POA: Insufficient documentation

## 2023-11-28 DIAGNOSIS — M62830 Muscle spasm of back: Secondary | ICD-10-CM

## 2023-11-28 DIAGNOSIS — E1365 Other specified diabetes mellitus with hyperglycemia: Secondary | ICD-10-CM | POA: Insufficient documentation

## 2023-11-28 DIAGNOSIS — M79672 Pain in left foot: Secondary | ICD-10-CM | POA: Insufficient documentation

## 2023-11-28 DIAGNOSIS — M47816 Spondylosis without myelopathy or radiculopathy, lumbar region: Secondary | ICD-10-CM | POA: Insufficient documentation

## 2023-11-28 DIAGNOSIS — M79605 Pain in left leg: Secondary | ICD-10-CM | POA: Insufficient documentation

## 2023-11-28 DIAGNOSIS — M5442 Lumbago with sciatica, left side: Secondary | ICD-10-CM | POA: Diagnosis not present

## 2023-11-28 MED ORDER — ROPIVACAINE HCL 2 MG/ML IJ SOLN
INTRAMUSCULAR | Status: AC
  Filled 2023-11-28: qty 20

## 2023-11-28 MED ORDER — PENTAFLUOROPROP-TETRAFLUOROETH EX AERO
INHALATION_SPRAY | Freq: Once | CUTANEOUS | Status: AC
  Administered 2023-11-28: 30 via TOPICAL

## 2023-11-28 MED ORDER — TRIAMCINOLONE ACETONIDE 40 MG/ML IJ SUSP
INTRAMUSCULAR | Status: AC
  Filled 2023-11-28: qty 1

## 2023-11-28 MED ORDER — SODIUM CHLORIDE 0.9% FLUSH
2.0000 mL | Freq: Once | INTRAVENOUS | Status: AC
  Administered 2023-11-28: 10 mL

## 2023-11-28 MED ORDER — TRIAMCINOLONE ACETONIDE 40 MG/ML IJ SUSP
40.0000 mg | Freq: Once | INTRAMUSCULAR | Status: AC
  Administered 2023-11-28: 40 mg

## 2023-11-28 MED ORDER — SODIUM CHLORIDE (PF) 0.9 % IJ SOLN
INTRAMUSCULAR | Status: AC
  Filled 2023-11-28: qty 10

## 2023-11-28 MED ORDER — MIDAZOLAM HCL 2 MG/2ML IJ SOLN: 0.5000 mg | Freq: Once | INTRAMUSCULAR | Status: DC

## 2023-11-28 MED ORDER — LIDOCAINE HCL 2 % IJ SOLN
INTRAMUSCULAR | Status: AC
  Filled 2023-11-28: qty 20

## 2023-11-28 MED ORDER — FENTANYL CITRATE (PF) 100 MCG/2ML IJ SOLN
25.0000 ug | INTRAMUSCULAR | Status: DC | PRN
  Administered 2023-11-28: 50 ug via INTRAVENOUS

## 2023-11-28 MED ORDER — ROPIVACAINE HCL 2 MG/ML IJ SOLN: 2.0000 mL | Freq: Once | INTRAMUSCULAR | Status: DC

## 2023-11-28 MED ORDER — IOHEXOL 180 MG/ML  SOLN
INTRAMUSCULAR | Status: AC
Start: 2023-11-28 — End: 2023-11-28
  Filled 2023-11-28: qty 10

## 2023-11-28 MED ORDER — FENTANYL CITRATE (PF) 100 MCG/2ML IJ SOLN
INTRAMUSCULAR | Status: AC
  Filled 2023-11-28: qty 2

## 2023-11-28 MED ORDER — LIDOCAINE HCL 2 % IJ SOLN
20.0000 mL | Freq: Once | INTRAMUSCULAR | Status: AC
  Administered 2023-11-28: 400 mg

## 2023-11-28 MED ORDER — IOHEXOL 180 MG/ML  SOLN
10.0000 mL | Freq: Once | INTRAMUSCULAR | Status: AC
  Administered 2023-11-28: 10 mL via EPIDURAL

## 2023-11-28 MED ORDER — MIDAZOLAM HCL 5 MG/5ML IJ SOLN
0.5000 mg | Freq: Once | INTRAMUSCULAR | Status: AC
  Administered 2023-11-28: 3 mg via INTRAVENOUS

## 2023-11-28 MED ORDER — MIDAZOLAM HCL 5 MG/5ML IJ SOLN
INTRAMUSCULAR | Status: AC
  Filled 2023-11-28: qty 5

## 2023-11-28 NOTE — Progress Notes (Signed)
 Safety precautions to be maintained throughout the outpatient stay will include: orient to surroundings, keep bed in low position, maintain call bell within reach at all times, provide assistance with transfer out of bed and ambulation.

## 2023-11-28 NOTE — Progress Notes (Signed)
 PROVIDER NOTE: Interpretation of information contained herein should be left to medically-trained personnel. Specific patient instructions are provided elsewhere under Patient Instructions section of medical record. This document was created in part using STT-dictation technology, any transcriptional errors that may result from this process are unintentional.  Patient: Kristy Cunningham Type: Established DOB: 11/07/1970 MRN: [id number] PCP: Era Raisin, NP  Service: Procedure DOS: 11/28/2023 Setting: Ambulatory Location: Ambulatory outpatient facility Delivery: Face-to-face Provider: Eric DELENA Como, MD Specialty: Interventional Pain Management Specialty designation: 09 Location: Outpatient facility Ref. Prov.: Como Eric, MD       Interventional Therapy   Type: Lumbar epidural steroid injection (LESI) (interlaminar) #3    Laterality: Left   Level:  L3-4 Level.  Imaging: Fluoroscopic guidance Spinal ([id number]) Anesthesia: Local anesthesia (1-2% Lidocaine ) Anxiolysis: IV Versed  3.0 mg Sedation: Minimal Sedation Fentanyl  1 mL (50 mcg) DOS: 11/28/2023  Performed by: Eric DELENA Como, MD  Purpose: Diagnostic/Therapeutic Indications: Lumbar radicular pain of intraspinal etiology of more than 4 weeks that has failed to respond to conservative therapy and is severe enough to impact quality of life or function. 1. Chronic low back pain (Midline) (4th area of Pain) w/ sciatica (Left)   2. Chronic lower extremity pain (1ry area of Pain) (Left)   3. Lumbar intervertebral disc protrusion (Right: L2-3) (Left: L3-4)   4. Lumbar radiculopathy (Left)   5. Lumbar spondylosis   6. Muscle spasms of lower extremity (Left)   7. Spasm of muscle of lower back   8. Spondylolisthesis, lumbar region   9. Chronic knee pain (3ry area of Pain) (Left)   10. Chronic foot pain (2ry area of Pain) (Left)   11. 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 Abnormal MRI, lumbar  spine (11/30/2022) ([facility])   13. Uncontrolled other specified diabetes mellitus with hyperglycemia (HCC)    NAS-11 Pain score:   Pre-procedure: 6 /10   Post-procedure: 0-No pain/10      Position / Prep / Materials:  Position: Prone w/ head of the table raised (slight reverse trendelenburg) to facilitate breathing.  Prep solution: ChloraPrep (2% chlorhexidine gluconate and 70% isopropyl alcohol) Prep Area: Entire Posterior Lumbar Region from lower scapular tip down to mid buttocks area and from flank to flank. Materials:  Tray: Epidural tray Needle(s):  Type: Epidural needle (Tuohy) Gauge (G):  17 Length: Regular (3.5-in) Qty: 1  H&P (Pre-op Assessment):  Kristy Cunningham is a 53 y.o. (year old), female patient, seen today for interventional treatment. She  has a past surgical history that includes RIGHT/LEFT HEART CATH AND CORONARY ANGIOGRAPHY (Bilateral, 08/23/2023). Kristy Cunningham has a current medication list which includes the following prescription(s): aspirin ec, dapagliflozin  propanediol, docusate sodium, duloxetine, fluticasone, furosemide , glucose blood, glucose blood, unifine pentips, lantus solostar, metoprolol  succinate, omeprazole, entresto , spironolactone , and tizanidine, and the following Facility-Administered Medications: fentanyl , midazolam , and ropivacaine  (pf) 2 mg/ml (0.2%). Her primarily concern today is the Back Pain  Initial Vital Signs:  Pulse/HCG Rate: 76ECG Heart Rate: 74 Temp: 98.1 F (36.7 C) Resp: 16 BP: (!) 150/87 SpO2: 97 %  BMI: Estimated body mass index is 30.47 kg/m as calculated from the following:   Height as of this encounter: 5' (1.524 m).   Weight as of this encounter: 156 lb (70.8 kg).  Risk Assessment: Allergies: Reviewed. She is allergic to keflex [cephalexin] and jardiance  [empagliflozin ].  Allergy Precautions: None required Coagulopathies: Reviewed. None identified.  Blood-thinner therapy: None at this time Active Infection(s):  Reviewed. None identified. Kristy Cunningham is afebrile  Site Confirmation: Kristy Cunningham was asked to confirm the procedure and laterality before marking the site Procedure checklist: Completed Consent: Before the procedure and under the influence of no sedative(s), amnesic(s), or anxiolytics, the patient was informed of the treatment options, risks and possible complications. To fulfill our ethical and legal obligations, as recommended by the American Medical Association's Code of Ethics, I have informed the patient of my clinical impression; the nature and purpose of the treatment or procedure; the risks, benefits, and possible complications of the intervention; the alternatives, including doing nothing; the risk(s) and benefit(s) of the alternative treatment(s) or procedure(s); and the risk(s) and benefit(s) of doing nothing. The patient was provided information about the general risks and possible complications associated with the procedure. These may include, but are not limited to: failure to achieve desired goals, infection, bleeding, organ or nerve damage, allergic reactions, paralysis, and death. In addition, the patient was informed of those risks and complications associated to Spine-related procedures, such as failure to decrease pain; infection (i.e.: Meningitis, epidural or intraspinal abscess); bleeding (i.e.: epidural hematoma, subarachnoid hemorrhage, or any other type of intraspinal or peri-dural bleeding); organ or nerve damage (i.e.: Any type of peripheral nerve, nerve root, or spinal cord injury) with subsequent damage to sensory, motor, and/or autonomic systems, resulting in permanent pain, numbness, and/or weakness of one or several areas of the body; allergic reactions; (i.e.: anaphylactic reaction); and/or death. Furthermore, the patient was informed of those risks and complications associated with the medications. These include, but are not limited to: allergic reactions (i.e.:  anaphylactic or anaphylactoid reaction(s)); adrenal axis suppression; blood sugar elevation that in diabetics may result in ketoacidosis or comma; water retention that in patients with history of congestive heart failure may result in shortness of breath, pulmonary edema, and decompensation with resultant heart failure; weight gain; swelling or edema; medication-induced neural toxicity; particulate matter embolism and blood vessel occlusion with resultant organ, and/or nervous system infarction; and/or aseptic necrosis of one or more joints. Finally, the patient was informed that Medicine is not an exact science; therefore, there is also the possibility of unforeseen or unpredictable risks and/or possible complications that may result in a catastrophic outcome. The patient indicated having understood very clearly. We have given the patient no guarantees and we have made no promises. Enough time was given to the patient to ask questions, all of which were answered to the patient's satisfaction. Ms. Borden has indicated that she wanted to continue with the procedure. Attestation: I, the ordering provider, attest that I have discussed with the patient the benefits, risks, side-effects, alternatives, likelihood of achieving goals, and potential problems during recovery for the procedure that I have provided informed consent. Date  Time: 11/28/2023 10:46 AM  Pre-Procedure Preparation:  Monitoring: As per clinic protocol. Respiration, ETCO2, SpO2, BP, heart rate and rhythm monitor placed and checked for adequate function Safety Precautions: Patient was assessed for positional comfort and pressure points before starting the procedure. Time-out: I initiated and conducted the Time-out before starting the procedure, as per protocol. The patient was asked to participate by confirming the accuracy of the Time Out information. Verification of the correct person, site, and procedure were performed and confirmed by  me, the nursing staff, and the patient. Time-out conducted as per Joint Commission's Universal Protocol (UP.01.01.01). Time: 1127 Start Time: 1128 hrs.  Description/Narrative of Procedure:          Target: Epidural space via interlaminar opening, initially targeting the lower laminar border of the superior vertebral  body. Region: Lumbar Approach: Percutaneous paravertebral  Rationale (medical necessity): procedure needed and proper for the diagnosis and/or treatment of the patient's medical symptoms and needs. Procedural Technique Safety Precautions: Aspiration looking for blood return was conducted prior to all injections. At no point did we inject any substances, as a needle was being advanced. No attempts were made at seeking any paresthesias. Safe injection practices and needle disposal techniques used. Medications properly checked for expiration dates. SDV (single dose vial) medications used. Description of the Procedure: Protocol guidelines were followed. The procedure needle was introduced through the skin, ipsilateral to the reported pain, and advanced to the target area. Bone was contacted and the needle walked caudad, until the lamina was cleared. The epidural space was identified using "loss-of-resistance technique" with 2-3 ml of PF-NaCl (0.9% NSS), in a 5cc LOR glass syringe.  Vitals:   11/28/23 1136 11/28/23 1140 11/28/23 1150 11/28/23 1201  BP: 129/76 127/74 128/76 (!) 144/90  Pulse:      Resp: 10 13 13 14   Temp:  (!) 97.5 F (36.4 C)  (!) 97.1 F (36.2 C)  TempSrc:      SpO2: 100% 95% 96% 98%  Weight:      Height:        Start Time: 1128 hrs. End Time: 1134 hrs.  Imaging Guidance (Spinal):          Type of Imaging Technique: Fluoroscopy Guidance (Spinal) Indication(s): Fluoroscopy guidance for needle placement to enhance accuracy in procedures requiring precise needle localization for targeted delivery of medication in or near specific anatomical locations not easily  accessible without such real-time imaging assistance. Exposure Time: Please see nurses notes. Contrast: Before injecting any contrast, we confirmed that the patient did not have an allergy to iodine, shellfish, or radiological contrast. Once satisfactory needle placement was completed at the desired level, radiological contrast was injected. Contrast injected under live fluoroscopy. No contrast complications. See chart for type and volume of contrast used. Fluoroscopic Guidance: I was personally present during the use of fluoroscopy. Tunnel Vision Technique used to obtain the best possible view of the target area. Parallax error corrected before commencing the procedure. Direction-depth-direction technique used to introduce the needle under continuous pulsed fluoroscopy. Once target was reached, antero-posterior, oblique, and lateral fluoroscopic projection used confirm needle placement in all planes. Images permanently stored in EMR. Interpretation: I personally interpreted the imaging intraoperatively. Adequate needle placement confirmed in multiple planes. Appropriate spread of contrast into desired area was observed. No evidence of afferent or efferent intravascular uptake. No intrathecal or subarachnoid spread observed. Permanent images saved into the patient's record.  Antibiotic Prophylaxis:   Anti-infectives (From admission, onward)    None      Indication(s): None identified  Post-operative Assessment:  Post-procedure Vital Signs:  Pulse/HCG Rate: 7679 Temp: (!) 97.1 F (36.2 C) Resp: 14 BP: (!) 144/90 SpO2: 98 %  EBL: None  Complications: No immediate post-treatment complications observed by team, or reported by patient.  Note: The patient tolerated the entire procedure well. A repeat set of vitals were taken after the procedure and the patient was kept under observation following institutional policy, for this type of procedure. Post-procedural neurological assessment was  performed, showing return to baseline, prior to discharge. The patient was provided with post-procedure discharge instructions, including a section on how to identify potential problems. Should any problems arise concerning this procedure, the patient was given instructions to immediately contact us , at any time, without hesitation. In any case, we plan to contact  the patient by telephone for a follow-up status report regarding this interventional procedure.  Comments:  No additional relevant information.  Plan of Care (POC)  Orders:  Orders Placed This Encounter  Procedures   Lumbar Epidural Injection    Scheduling Instructions:     Procedure: Interlaminar LESI L3-4     Laterality: Left     Sedation: With Sedation     Date: 11/28/2023    Where will this procedure be performed?:   [facility]   [facility] C-ARM 1-60 MIN NO REPORT    Intraoperative interpretation by procedural physician at [facility].    Standing Status:   Standing    Number of Occurrences:   1    Reason for exam::   Assistance in needle guidance and placement for procedures requiring needle placement in or near specific anatomical locations not easily accessible without such assistance.   Informed Consent Details: Physician/Practitioner Attestation; Transcribe to consent form and obtain patient signature    Note: Always confirm laterality of pain with Ms. Baltimore, before procedure. Transcribe to consent form and obtain patient signature.    Physician/Practitioner attestation of informed consent for procedure/surgical case:   I, the physician/practitioner, attest that I have discussed with the patient the benefits, risks, side effects, alternatives, likelihood of achieving goals and potential problems during recovery for the procedure that I have provided informed consent.    Procedure:   Lumbar epidural steroid injection under fluoroscopic guidance    Physician/Practitioner performing the procedure:    Ameilia Rattan A. Tanya, MD    Indication/Reason:   Low back and/or lower extremity pain secondary to lumbar radiculitis   Provide equipment / supplies at bedside    Procedural tray: Epidural Tray (Disposable  single use) Skin infiltration needle: Regular 1.5-in, 25-G, (x1) Block needle size: Regular standard Catheter: No catheter required    Standing Status:   Standing    Number of Occurrences:   1    Specify:   Epidural Tray   Saline lock IV    Have LR [phone] mL available and administer at 125 mL/hr if patient becomes hypotensive.    Standing Status:   Standing    Number of Occurrences:   1     No chronic opioid analgesics therapy prescribed by our practice. None MME/day: 0 mg/day    Medications ordered for procedure: Meds ordered this encounter  Medications   iohexol  (OMNIPAQUE ) 180 MG/ML injection 10 mL    Must be Myelogram-compatible. If not available, you may substitute with a water-soluble, non-ionic, hypoallergenic, myelogram-compatible radiological contrast medium.   lidocaine  (XYLOCAINE ) 2 % (with pres) injection 400 mg   pentafluoroprop-tetrafluoroeth (GEBAUERS) aerosol   midazolam  (VERSED ) injection 0.5-2 mg    Make sure Flumazenil is available in the pyxis when using this medication. If oversedation occurs, administer 0.2 mg IV over 15 sec. If after 45 sec no response, administer 0.2 mg again over 1 min; may repeat at 1 min intervals; not to exceed 4 doses (1 mg)   midazolam  (VERSED ) 5 MG/5ML injection 0.5-2 mg    Make sure Flumazenil is available in the pyxis when using this medication. If oversedation occurs, administer 0.2 mg IV over 15 sec. If after 45 sec no response, administer 0.2 mg again over 1 min; may repeat at 1 min intervals; not to exceed 4 doses (1 mg)   fentaNYL  (SUBLIMAZE ) injection 25-50 mcg    Make sure Narcan is available in the pyxis when using this medication. In the  event of respiratory depression (RR< 8/min): Titrate NARCAN (naloxone) in  increments of 0.1 to 0.2 mg IV at 2-3 minute intervals, until desired degree of reversal.   sodium chloride  flush (NS) 0.9 % injection 2 mL   ropivacaine  (PF) 2 mg/mL (0.2%) (NAROPIN ) injection 2 mL   triamcinolone  acetonide (KENALOG -40) injection 40 mg   Medications administered: We administered iohexol , lidocaine , pentafluoroprop-tetrafluoroeth, midazolam , fentaNYL , sodium chloride  flush, and triamcinolone  acetonide.  See the medical record for exact dosing, route, and time of administration.    Interventional Therapies  Risk Factors  Considerations  Medical Comorbidities:  Uncontrolled T2IDDM  CHF  Hx. Fluid Overload     Planned  Pending:      Under consideration:      Completed:   Diagnostic/therapeutic left-sided L3-4 LESI x3 (11/28/2023) (100/100/50/50)    Therapeutic  Palliative (PRN) options:   None established   Completed by other providers:   (01/18/2023) diagnostic left sacroiliac joint injection by [facility] (12/14/2022) lower extremity EMG by [facility]      Follow-up plan:   Return in about 2 weeks (around 12/12/2023) for Eval-day (M,W), (Face2F), (PPE).     Recent Visits Date Type Provider Dept  11/01/23 Office Visit Tanya Glisson, MD [facility]  Showing recent visits within past 90 days and meeting all other requirements Today's Visits Date Type Provider Dept  11/28/23 Procedure visit Tanya Glisson, MD [facility]  Showing today's visits and meeting all other requirements Future Appointments Date Type Provider Dept  12/18/23 Appointment Tanya Glisson, MD [facility]  Showing future appointments within next 90 days and meeting all other requirements   Disposition: Discharge home  Discharge (Date  Time): 11/28/2023; 1203 hrs.   Primary Care Physician: Era Raisin, NP Location: [facility] Note by: Glisson DELENA Tanya, MD (TTS technology used. I apologize for any  typographical errors that were not detected and corrected.) Date: 11/28/2023; Time: 12:15 PM  Disclaimer:  Medicine is not an Visual merchandiser. The only guarantee in medicine is that nothing is guaranteed. It is important to note that the decision to proceed with this intervention was based on the information collected from the patient. The Data and conclusions were drawn from the patient's questionnaire, the interview, and the physical examination. Because the information was provided in large part by the patient, it cannot be guaranteed that it has not been purposely or unconsciously manipulated. Every effort has been made to obtain as much relevant data as possible for this evaluation. It is important to note that the conclusions that lead to this procedure are derived in large part from the available data. Always take into account that the treatment will also be dependent on availability of resources and existing treatment guidelines, considered by other Pain Management Practitioners as being common knowledge and practice, at the time of the intervention. For Medico-Legal purposes, it is also important to point out that variation in procedural techniques and pharmacological choices are the acceptable norm. The indications, contraindications, technique, and results of the above procedure should only be interpreted and judged by a Board-Certified Interventional Pain Specialist with extensive familiarity and expertise in the same exact procedure and technique.

## 2023-11-28 NOTE — Patient Instructions (Signed)
 ______________________________________________________________________    Post-Procedure Discharge Instructions  Instructions: Apply ice:  Purpose: This will minimize any swelling and discomfort after procedure.  When: Day of procedure, as soon as you get home. How: Fill a plastic sandwich bag with crushed ice. Cover it with a small towel and apply to injection site. How long: (15 min on, 15 min off) Apply for 15 minutes then remove x 15 minutes.  Repeat sequence on day of procedure, until you go to bed. Apply heat:  Purpose: To treat any soreness and discomfort from the procedure. When: Starting the next day after the procedure. How: Apply heat to procedure site starting the day following the procedure. How long: May continue to repeat daily, until discomfort goes away. Food intake: Start with clear liquids (like water) and advance to regular food, as tolerated.  Physical activities: Keep activities to a minimum for the first 8 hours after the procedure. After that, then as tolerated. Driving: If you have received any sedation, be responsible and do not drive. You are not allowed to drive for 24 hours after having sedation. Blood thinner: (Applies only to those taking blood thinners) You may restart your blood thinner 6 hours after your procedure. Insulin : (Applies only to Diabetic patients taking insulin ) As soon as you can eat, you may resume your normal dosing schedule. Infection prevention: Keep procedure site clean and dry. Shower daily and clean area with soap and water. Post-procedure Pain Diary: Extremely important that this be done correctly and accurately. Recorded information will be used to determine the next step in treatment. For the purpose of accuracy, follow these rules: Evaluate only the area treated. Do not report or include pain from an untreated area. For the purpose of this evaluation, ignore all other areas of pain, except for the treated area. After your procedure,  avoid taking a long nap and attempting to complete the pain diary after you wake up. Instead, set your alarm clock to go off every hour, on the hour, for the initial 8 hours after the procedure. Document the duration of the numbing medicine, and the relief you are getting from it. Do not go to sleep and attempt to complete it later. It will not be accurate. If you received sedation, it is likely that you were given a medication that may cause amnesia. Because of this, completing the diary at a later time may cause the information to be inaccurate. This information is needed to plan your care. Follow-up appointment: Keep your post-procedure follow-up evaluation appointment after the procedure (usually 2 weeks for most procedures, 6 weeks for radiofrequencies). DO NOT FORGET to bring you pain diary with you.   Expect: (What should I expect to see with my procedure?) From numbing medicine (AKA: Local Anesthetics): Numbness or decrease in pain. You may also experience some weakness, which if present, could last for the duration of the local anesthetic. Onset: Full effect within 15 minutes of injected. Duration: It will depend on the type of local anesthetic used. On the average, 1 to 8 hours.  From steroids (Applies only if steroids were used): Decrease in swelling or inflammation. Once inflammation is improved, relief of the pain will follow. Onset of benefits: Depends on the amount of swelling present. The more swelling, the longer it will take for the benefits to be seen. In some cases, up to 10 days. Duration: Steroids will stay in the system x 2 weeks. Duration of benefits will depend on multiple posibilities including persistent irritating factors. Side-effects: If  present, they may typically last 2 weeks (the duration of the steroids). Frequent: Cramps (if they occur, drink Gatorade and take over-the-counter Magnesium 450-500 mg once to twice a day); water retention with temporary weight gain;  increases in blood sugar; decreased immune system response; increased appetite. Occasional: Facial flushing (red, warm cheeks); mood swings; menstrual changes. Uncommon: Long-term decrease or suppression of natural hormones; bone thinning. (These are more common with higher doses or more frequent use. This is why we prefer that our patients avoid having any injection therapies in other practices.)  Very Rare: Severe mood changes; psychosis; aseptic necrosis. From procedure: Some discomfort is to be expected once the numbing medicine wears off. This should be minimal if ice and heat are applied as instructed.  Call if: (When should I call?) You experience numbness and weakness that gets worse with time, as opposed to wearing off. New onset bowel or bladder incontinence. (Applies only to procedures done in the spine)  Emergency Numbers: Durning business hours (Monday - Thursday, 8:00 AM - 4:00 PM) (Friday, 9:00 AM - 12:00 Noon): [phone] After hours: [phone] NOTE: If you are having a problem and are unable connect with, or to talk to a provider, then go to your nearest urgent care or emergency department. If the problem is serious and urgent, please call 911. ______________________________________________________________________     ______________________________________________________________________    Steroid injections  Common steroids for injections Triamcinolone : Used by many sports medicine physicians for large joint and bursal injections, often combined with a local anesthetic like lidocaine . A study focusing on coccydynia (tailbone pain) found triamcinolone  was more effective than betamethasone, suggesting it may also be preferable for other localized inflammation conditions. Methylprednisolone: A common alternative to triamcinolone  that is also a strong anti-inflammatory. It is available in different formulations, with the acetate suspension being the long-acting option  for intra-articular injections. Dexamethasone: This is a non-particulate steroid, meaning it has a lower risk of tissue damage compared to particulate steroids like triamcinolone  and methylprednisolone. While less common for this specific use, it is an option for targeted injections.   Considerations for physicians Particulate vs. non-particulate steroids: Triamcinolone  and methylprednisolone are particulate, meaning they can clump together. Dexamethasone is non-particulate. Particulate steroids are often preferred for their longer-lasting effects but carry a theoretical higher risk for certain injections (though this is less of a concern in the costochondral joints). Combined injectate: Corticosteroids are typically mixed with a local anesthetic like lidocaine  to provide both immediate pain relief (from the anesthetic) and longer-term inflammation reduction (from the steroid). Imaging guidance: To ensure accurate placement of the needle and medication, physicians may use ultrasound or fluoroscopic guidance for the injection, especially in complex or refractory cases.   Patient guidance Before undergoing a steroid injection, discuss the options with your physician. They will determine the best steroid, dosage, and procedure for your specific case based on factors like: Severity of your condition History of response to other treatments Your overall health status Experience and preference of the physician  Last  Updated: 11/21/2023 ______________________________________________________________________

## 2023-11-29 ENCOUNTER — Telehealth: Payer: Self-pay | Admitting: *Deleted

## 2023-11-29 NOTE — Telephone Encounter (Signed)
 Post procedure call; no questions or concerns.  Patient wanted me to let FN know that the amount of sedation medicine that he used on yesterday was perfect and she tolerated procedure much better.

## 2023-12-17 NOTE — Progress Notes (Unsigned)
 PROVIDER NOTE: Interpretation of information contained herein should be left to medically-trained personnel. Specific patient instructions are provided elsewhere under Patient Instructions section of medical record. This document was created in part using AI and STT-dictation technology, any transcriptional errors that may result from this process are unintentional.  Patient: Kristy Cunningham  Service: E/M   PCP: Era Raisin, NP  DOB: [birth year]  DOS: 12/18/2023  Provider: Eric DELENA Como, MD  MRN: [id number]  Delivery: Face-to-face  Specialty: Interventional Pain Management  Type: Established Patient  Setting: Ambulatory outpatient facility  Specialty designation: 09  Referring Prov.: Era Raisin, NP  Location: Outpatient office facility       History of present illness (HPI) [name], a 53 y.o. year old female, is here today because of her Chronic midline low back pain with left-sided sciatica [M54.42, G89.29]. [name] primary complain today is No chief complaint on file.  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-4, L4-5); Chronic LLQ pain; Lumbar spondylosis; Chronic sacroiliac joint pain (Left); Swelling of lower leg; Chronic pain syndrome; Abnormal MRI, lumbar spine (11/30/2022) ([facility]); Chronic foot pain (2ry area of Pain) (Left); Chronic knee pain (3ry area of Pain) (Left); Spasm of muscle of lower back; Muscle spasms of lower extremity (Left); Fasciculations of muscle (hamstring) (Left); Allodynia (foot) (Left); Swelling of foot (Left); Lumbar intervertebral disc protrusion (Right: L2-3) (Left: L3-4); Displacement of lumbar intervertebral disc; Chronic lower extremity pain (1ry area of Pain) (Left); Chronic low back pain (Midline) (4th area of Pain) w/ sciatica (Left); Edema of lower extremities (Bilateral); Chronic painful diabetic neuropathy (HCC);  Diabetic peripheral neuropathy (HCC); and Trochanteric bursitis of hips (Bilateral) on their pertinent problem list.  Pain Assessment: Severity of   is reported as a  /10. Location:    / . Onset:  . Quality:  . Timing:  . Modifying factor(s):  SABRA Vitals:  vitals were not taken for this visit.  BMI: Estimated body mass index is 30.47 kg/m as calculated from the following:   Height as of 11/28/23: 5' (1.524 m).   Weight as of 11/28/23: 156 lb (70.8 kg).  Last encounter: 11/01/2023. Last procedure: 11/28/2023.  Reason for encounter: post-procedure evaluation and assessment.   Discussed the use of AI scribe software for clinical note transcription with the patient, who gave verbal consent to proceed.  History of Present Illness          Post-Procedure Evaluation   Type: Lumbar epidural steroid injection (LESI) (interlaminar) #3    Laterality: Left   Level:  L3-4 Level.  Imaging: Fluoroscopic guidance Spinal ([id number]) Anesthesia: Local anesthesia (1-2% Lidocaine ) Anxiolysis: IV Versed  3.0 mg Sedation: Minimal Sedation Fentanyl  1 mL (50 mcg) DOS: 11/28/2023  Performed by: Eric DELENA Como, MD  Purpose: Diagnostic/Therapeutic Indications: Lumbar radicular pain of intraspinal etiology of more than 4 weeks that has failed to respond to conservative therapy and is severe enough to impact quality of life or function. 1. Chronic low back pain (Midline) (4th area of Pain) w/ sciatica (Left)   2. Chronic lower extremity pain (1ry area of Pain) (Left)   3. Lumbar intervertebral disc protrusion (Right: L2-3) (Left: L3-4)   4. Lumbar radiculopathy (Left)   5. Lumbar spondylosis   6. Muscle spasms of lower extremity (Left)   7. Spasm of muscle of lower back   8. Spondylolisthesis, lumbar region   9. Chronic knee pain (3ry area of Pain) (Left)   10.  Chronic foot pain (2ry area of Pain) (Left)   11. 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 Abnormal MRI, lumbar spine  (11/30/2022) ([facility])   13. Uncontrolled other specified diabetes mellitus with hyperglycemia (HCC)    NAS-11 Pain score:   Pre-procedure: 6 /10   Post-procedure: 0-No pain/10     Effectiveness:  Initial hour after procedure:   ***. Subsequent 4-6 hours post-procedure:   ***. Analgesia past initial 6 hours:   ***. Ongoing improvement:  Analgesic:  *** Function:    ***    ROM:    ***     Pharmacotherapy Assessment   No chronic opioid analgesics therapy prescribed by our practice. None MME/day: 0 mg/day   Monitoring: [place] PMP: PDMP reviewed during this encounter.       Pharmacotherapy: No side-effects or adverse reactions reported. Compliance: No problems identified. Effectiveness: Clinically acceptable.  No notes on file  UDS:  Summary  Date Value Ref Range Status  02/08/2023 Note  Final    Comment:    ==================================================================== Compliance Drug Analysis, Ur ==================================================================== Specimen Alert Not Detected result may be consistent with the time of last use noted for this medication. AS NEEDED (Oxycodone , Tramadol) ==================================================================== Test                             Result       Flag       Units  Drug Present and Declared for Prescription Verification   Desmethylcyclobenzaprine       PRESENT      EXPECTED    Desmethylcyclobenzaprine is an expected metabolite of    cyclobenzaprine.    Sertraline                     PRESENT      EXPECTED   Desmethylsertraline            PRESENT      EXPECTED    Desmethylsertraline is an expected metabolite of sertraline.  Drug Absent but Declared for Prescription Verification   Oxycodone                       Not Detected UNEXPECTED ng/mg creat   Tramadol                       Not Detected UNEXPECTED ng/mg creat   Gabapentin                     Not Detected UNEXPECTED   Tizanidine                      Not Detected UNEXPECTED    Tizanidine, as indicated in the declared medication list, is not    always detected even when used as directed.    Acetaminophen                   Not Detected UNEXPECTED    Acetaminophen , as indicated in the declared medication list, is not    always detected even when used as directed.    Diclofenac                     Not Detected UNEXPECTED    Diclofenac, as indicated in the declared medication list, is not    always detected even when used as directed.    Naproxen   Not Detected UNEXPECTED ==================================================================== Test                      Result    Flag   Units      Ref Range   Creatinine              43               mg/dL      >=79 ==================================================================== Declared Medications:  The flagging and interpretation on this report are based on the  following declared medications.  Unexpected results may arise from  inaccuracies in the declared medications.   **Note: The testing scope of this panel includes these medications:   Cyclobenzaprine (Flexeril)  Gabapentin (Neurontin)  Naproxen  (Naprosyn )  Oxycodone   Sertraline (Zoloft)  Tramadol (Ultram)   **Note: The testing scope of this panel does not include small to  moderate amounts of these reported medications:   Acetaminophen   Diclofenac (Voltaren)  Tizanidine (Zanaflex)   **Note: The testing scope of this panel does not include the  following reported medications:   Azithromycin  (Zithromax )  Bisacodyl  Cephalexin  Clotrimazole (Lotrimin)  Dicyclomine (Bentyl)  Docusate (Colace)  Dulaglutide (Trulicity)  Empagliflozin  (Jardiance )  Fluconazole (Diflucan)  Fluticasone (Flonase)  Hydrochlorothiazide (Hyzaar)  Insulin  (Lantus)  Losartan  (Cozaar )  Losartan  (Hyzaar)  Meloxicam (Mobic)  Metformin (Janumet)  Omeprazole (Prilosec)  Ondansetron  (Zofran )  Polyethylene Glycol   Prednisone  (Deltasone )  Sitagliptin (Janumet)  Sulfamethoxazole (Bactrim)  Supplement  Topical  Trimethoprim (Bactrim) ==================================================================== For clinical consultation, please call [phone]. ====================================================================     No results found for: CBDTHCR No results found for: D8THCCBX No results found for: D9THCCBX  ROS  Constitutional: Denies any fever or chills Gastrointestinal: No reported hemesis, hematochezia, vomiting, or acute GI distress Musculoskeletal: Denies any acute onset joint swelling, redness, loss of ROM, or weakness Neurological: No reported episodes of acute onset apraxia, aphasia, dysarthria, agnosia, amnesia, paralysis, loss of coordination, or loss of consciousness  Medication Review  DULoxetine, Glucose Blood, Insulin  Pen Needle, aspirin EC, dapagliflozin  propanediol, docusate sodium, fluticasone, furosemide , glucose blood, insulin  glargine, metoprolol  succinate, omeprazole, sacubitril -valsartan , spironolactone , and tiZANidine  History Review  Allergy: Kristy Cunningham is allergic to keflex [cephalexin] and jardiance  [empagliflozin ]. Drug: Kristy Cunningham  reports no history of drug use. Alcohol:  reports no history of alcohol use. Tobacco:  reports that she has never smoked. She has never used smokeless tobacco. Social: Kristy Cunningham  reports that she has never smoked. She has never used smokeless tobacco. She reports that she does not drink alcohol and does not use drugs. Medical:  has a past medical history of Depression, Diabetes mellitus without complication (HCC), Hypertension, Low back strain (10/26/2022), and Pedal edema. Surgical: Kristy Cunningham  has a past surgical history that includes RIGHT/LEFT HEART CATH AND CORONARY ANGIOGRAPHY (Bilateral, 08/23/2023). Family: family history includes Breast cancer in her paternal grandmother.  Laboratory Chemistry Profile    Renal Lab Results  Component Value Date   BUN 33 (H) 09/10/2023   CREATININE 0.83 09/10/2023   BCR 31 (H) 08/02/2023   GFRNONAA >60 09/10/2023    Hepatic Lab Results  Component Value Date   AST 19 09/10/2023   ALT 23 09/10/2023   ALBUMIN 3.9 09/10/2023   ALKPHOS 96 09/10/2023   LIPASE 31 09/10/2023    Electrolytes Lab Results  Component Value Date   NA 133 (L) 09/10/2023   K 4.8 09/10/2023   CL 93 (L) 09/10/2023   CALCIUM 9.9 09/10/2023  MG 2.1 02/08/2023    Bone Lab Results  Component Value Date   25OHVITD1 32 02/08/2023   25OHVITD2 <1.0 02/08/2023   25OHVITD3 32 02/08/2023    Inflammation (CRP: Acute Phase) (ESR: Chronic Phase) Lab Results  Component Value Date   CRP 2 02/08/2023   ESRSEDRATE 68 (H) 02/08/2023   LATICACIDVEN 1.8 02/27/2021         Note: Above Lab results reviewed.  Recent Imaging Review  DG PAIN CLINIC C-ARM 1-60 MIN NO REPORT Fluoro was used, but no Radiologist interpretation will be provided.  Please refer to NOTES tab for provider progress note. Note: Reviewed        Physical Exam  Vitals: LMP 05/11/2016 (Approximate)  BMI: Estimated body mass index is 30.47 kg/m as calculated from the following:   Height as of 11/28/23: 5' (1.524 m).   Weight as of 11/28/23: 156 lb (70.8 kg). Ideal: Patient weight not recorded General appearance: Well nourished, well developed, and well hydrated. In no apparent acute distress Mental status: Alert, oriented x 3 (person, place, & time)       Respiratory: No evidence of acute respiratory distress Eyes: PERLA   Assessment   Diagnosis Status  1. Chronic low back pain (Midline) (4th area of Pain) w/ sciatica (Left)   2. Chronic lower extremity pain (1ry area of Pain) (Left)   3. Lumbar radiculopathy (Left)   4. Muscle spasms of lower extremity (Left)   5. Postop check    Controlled Controlled Controlled   Updated Problems: No problems updated.  Plan of Care  Problem-specific:   Assessment and Plan            Kristy Cunningham has a current medication list which includes the following long-term medication(s): duloxetine, fluticasone, furosemide , lantus solostar, metoprolol  succinate, omeprazole, and spironolactone .  Pharmacotherapy (Medications Ordered): No orders of the defined types were placed in this encounter.  Orders:  No orders of the defined types were placed in this encounter.    Interventional Therapies  Risk Factors  Considerations  Medical Comorbidities:  Uncontrolled T2IDDM  CHF  Hx. Fluid Overload     Planned  Pending:      Under consideration:      Completed:   Diagnostic/therapeutic left-sided L3-4 LESI x3 (11/28/2023) (100/100/50/50)    Therapeutic  Palliative (PRN) options:   None established   Completed by other providers:   (01/18/2023) diagnostic left sacroiliac joint injection by [facility] (12/14/2022) lower extremity EMG by [facility]     No follow-ups on file.    Recent Visits Date Type Provider Dept  11/28/23 Procedure visit Tanya Glisson, MD [facility]  11/01/23 Office Visit Tanya Glisson, MD [facility]  Showing recent visits within past 90 days and meeting all other requirements Future Appointments Date Type Provider Dept  12/18/23 Appointment Tanya Glisson, MD [facility]  Showing future appointments within next 90 days and meeting all other requirements  I discussed the assessment and treatment plan with the patient. The patient was provided an opportunity to ask questions and all were answered. The patient agreed with the plan and demonstrated an understanding of the instructions.  Patient advised to call back or seek an in-person evaluation if the symptoms or condition worsens.  Duration of encounter: *** minutes.  Total time on encounter, as per AMA guidelines included both the face-to-face and non-face-to-face time personally spent by the  physician and/or other qualified health care professional(s) on the day of the encounter (includes time  in activities that require the physician or other qualified health care professional and does not include time in activities normally performed by clinical staff). Physician's time may include the following activities when performed: Preparing to see the patient (e.g., pre-charting review of records, searching for previously ordered imaging, lab work, and nerve conduction tests) Review of prior analgesic pharmacotherapies. Reviewing PMP Interpreting ordered tests (e.g., lab work, imaging, nerve conduction tests) Performing post-procedure evaluations, including interpretation of diagnostic procedures Obtaining and/or reviewing separately obtained history Performing a medically appropriate examination and/or evaluation Counseling and educating the patient/family/caregiver Ordering medications, tests, or procedures Referring and communicating with other health care professionals (when not separately reported) Documenting clinical information in the electronic or other health record Independently interpreting results (not separately reported) and communicating results to the patient/ family/caregiver Care coordination (not separately reported)  Note by: Eric DELENA Como, MD (TTS and AI technology used. I apologize for any typographical errors that were not detected and corrected.) Date: 12/18/2023; Time: 4:09 PM

## 2023-12-18 ENCOUNTER — Ambulatory Visit (HOSPITAL_BASED_OUTPATIENT_CLINIC_OR_DEPARTMENT_OTHER): Admitting: Pain Medicine

## 2023-12-18 DIAGNOSIS — G8929 Other chronic pain: Secondary | ICD-10-CM

## 2023-12-18 DIAGNOSIS — M5416 Radiculopathy, lumbar region: Secondary | ICD-10-CM

## 2023-12-18 DIAGNOSIS — M62838 Other muscle spasm: Secondary | ICD-10-CM

## 2023-12-18 DIAGNOSIS — Z09 Encounter for follow-up examination after completed treatment for conditions other than malignant neoplasm: Secondary | ICD-10-CM

## 2023-12-18 DIAGNOSIS — Z91199 Patient's noncompliance with other medical treatment and regimen due to unspecified reason: Secondary | ICD-10-CM

## 2023-12-25 ENCOUNTER — Telehealth: Payer: Self-pay | Admitting: Pain Medicine

## 2023-12-25 NOTE — Telephone Encounter (Signed)
 PT called stated that her pain started back on the 5th of this month. PT stated that her pain has been at 10 level. PT also has some questions she will like to ask. Please give patient a call. TY

## 2023-12-26 NOTE — Progress Notes (Unsigned)
 PROVIDER NOTE: Interpretation of information contained herein should be left to medically-trained personnel. Specific patient instructions are provided elsewhere under Patient Instructions section of medical record. This document was created in part using AI and STT-dictation technology, any transcriptional errors that may result from this process are unintentional.  Patient: Kristy Cunningham  Service: E/M   PCP: Era Raisin, NP  DOB: 1970-04-09  DOS: 12/27/2023  Provider: Eric DELENA Como, MD  MRN: [id number]  Delivery: Face-to-face  Specialty: Interventional Pain Management  Type: Established Patient  Setting: Ambulatory outpatient facility  Specialty designation: 09  Referring Prov.: Era Raisin, NP  Location: Outpatient office facility       History of present illness (HPI) Kristy Cunningham, a 53 y.o. year old female, is here today because of her Lumbar radiculopathy [M54.16]. Kristy Cunningham's primary complain today is Back Pain (lower)  Pertinent problems: 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, L4-5); Chronic LLQ pain; Lumbar spondylosis; Chronic sacroiliac joint pain (Left); Swelling of lower leg; Chronic pain syndrome; Abnormal MRI, lumbar spine (11/30/2022) ([facility]); Chronic feet pain (Bilateral); Chronic knee pain (3ry area of Pain) (Left); Spasm of muscle of lower back; Muscle spasms of lower extremity (Left); Fasciculations of muscle (hamstring) (Left); Allodynia (foot) (Left); Chronic feet swelling (Bilateral); Lumbar intervertebral disc protrusion (Right: L2-3) (Left: L3-4); Displacement of lumbar intervertebral disc; Chronic lower extremity pain (1ry area of Pain) (Left); Chronic low back pain (Midline) (4th area of Pain) w/ sciatica (Left); Edema of lower extremities (Bilateral); Chronic painful diabetic neuropathy (HCC); Diabetic peripheral neuropathy (HCC); Trochanteric bursitis  of hips (Bilateral); Burning pain of the feet (Bilateral); Chronic low back pain (Midline) w/o sciatica; Chronic low back pain (Bilateral) w/o sciatica; Lumbar facet joint pain; and Diabetic sensorimotor polyneuropathy (HCC) on their pertinent problem list.  Pain Assessment: Severity of Chronic pain is reported as a 7 /10. Location: Back Lower/Pain radiaties to her feet. Onset: More than a month ago. Quality: Aching, Constant, Throbbing, Spasm. Timing: Constant. Modifying factor(s):  Kristy Cunningham:  height is 5' (1.524 m) and weight is 156 lb (70.8 kg). Her temperature is 97.3 F (36.3 C) (abnormal). Her blood pressure is 162/83 (abnormal) and her pulse is 77. Her oxygen saturation is 97%.  BMI: Estimated body mass index is 30.47 kg/m as calculated from the following:   Height as of this encounter: 5' (1.524 m).   Weight as of this encounter: 156 lb (70.8 kg).  Last encounter: 12/18/2023. Last procedure: 11/28/2023.  Reason for encounter: post-procedure evaluation and assessment.   Discussed the use of AI scribe software for clinical note transcription with the patient, who gave verbal consent to proceed.  History of Present Illness   Kristy Cunningham is a 53 year old female with chronic back pain and diabetic neuropathy who presents with worsening foot pain and swelling.  She recently underwent her third left-sided L3-4 lumbar epidural steroid injection on November 28, 2023, which provided complete relief from back pain for two days before the pain returned. Post-procedure, she experienced increased back pain, leg pain, muscle spasms, and buttock cramps.  Her primary concern is the pain and swelling in her feet. The pain is described as a numbing, burning sensation with a feeling of heat, requiring cold water immersion for relief. Swelling is present in both feet, and the pain intensity can reach a level of ten, affecting her emotional state and work attendance.  She has diabetic neuropathy,  confirmed by  an abnormal nerve conduction test on December 20, 2023, indicating nerve damage. She experiences numbness and burning in her feet.  For muscle spasms, she consumes tonic water but finds it unpalatable and relies on her usual pain medication, though the specific medication and dosage are unspecified. She avoids activities that exacerbate her back pain, such as lifting heavy objects or using a heavy vacuum cleaner.      Post-Procedure Evaluation   Type: Lumbar epidural steroid injection (LESI) (interlaminar) #3    Laterality: Left   Level:  L3-4 Level.  Imaging: Fluoroscopic guidance Spinal ([id number]) Anesthesia: Local anesthesia (1-2% Lidocaine ) Anxiolysis: IV Versed  3.0 mg Sedation: Minimal Sedation Fentanyl  1 mL (50 mcg) DOS: 11/28/2023  Performed by: Eric DELENA Como, MD  Purpose: Diagnostic/Therapeutic Indications: Lumbar radicular pain of intraspinal etiology of more than 4 weeks that has failed to respond to conservative therapy and is severe enough to impact quality of life or function. 1. Chronic low back pain (Midline) (4th area of Pain) w/ sciatica (Left)   2. Chronic lower extremity pain (1ry area of Pain) (Left)   3. Lumbar intervertebral disc protrusion (Right: L2-3) (Left: L3-4)   4. Lumbar radiculopathy (Left)   5. Lumbar spondylosis   6. Muscle spasms of lower extremity (Left)   7. Spasm of muscle of lower back   8. Spondylolisthesis, lumbar region   9. Chronic knee pain (3ry area of Pain) (Left)   10. Chronic foot pain (2ry area of Pain) (Left)   11. 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 Abnormal MRI, lumbar spine (11/30/2022) ([facility])   13. Uncontrolled other specified diabetes mellitus with hyperglycemia (HCC)    NAS-11 Pain score:   Pre-procedure: 6 /10   Post-procedure: 0-No pain/10     Effectiveness:  Initial hour after procedure: 100 %. Subsequent 4-6 hours post-procedure: 100 %. Analgesia past initial 6  hours: 100 % (lasted only 2 days, pain came back worse than before the procedure). Ongoing improvement:  Analgesic: The patient indicates having attained 100% relief of the pain for the duration of the local anesthetic which lasted for an additional 2 days.  Her back pain has returned, but she no longer has a radiculopathy.  Furthermore the nerve conduction test of the lower extremities were completed revealing that the patient has chronic bilateral diabetic polyneuropathy. Function: Transient improvement ROM: Transient improvement  Pharmacotherapy Assessment   No chronic opioid analgesics therapy prescribed by our practice. None MME/day: 0 mg/day   Monitoring: [place] PMP: PDMP reviewed during this encounter.       Pharmacotherapy: No side-effects or adverse reactions reported. Compliance: No problems identified. Effectiveness: Clinically acceptable.  Delores Dorothe DELENA, RN  12/27/2023  8:21 AM  Sign when Signing Visit Safety precautions to be maintained throughout the outpatient stay will include: orient to surroundings, keep bed in low position, maintain call bell within reach at all times, provide assistance with transfer out of bed and ambulation.     UDS:  Summary  Date Value Ref Range Status  02/08/2023 Note  Final    Comment:    ==================================================================== Compliance Drug Analysis, Ur ==================================================================== Specimen Alert Not Detected result may be consistent with the time of last use noted for this medication. AS NEEDED (Oxycodone , Tramadol) ==================================================================== Test                             Result       Flag  Units  Drug Present and Declared for Prescription Verification   Desmethylcyclobenzaprine       PRESENT      EXPECTED    Desmethylcyclobenzaprine is an expected metabolite of    cyclobenzaprine.    Sertraline                      PRESENT      EXPECTED   Desmethylsertraline            PRESENT      EXPECTED    Desmethylsertraline is an expected metabolite of sertraline.  Drug Absent but Declared for Prescription Verification   Oxycodone                       Not Detected UNEXPECTED ng/mg creat   Tramadol                       Not Detected UNEXPECTED ng/mg creat   Gabapentin                     Not Detected UNEXPECTED   Tizanidine                     Not Detected UNEXPECTED    Tizanidine, as indicated in the declared medication list, is not    always detected even when used as directed.    Acetaminophen                   Not Detected UNEXPECTED    Acetaminophen , as indicated in the declared medication list, is not    always detected even when used as directed.    Diclofenac                     Not Detected UNEXPECTED    Diclofenac, as indicated in the declared medication list, is not    always detected even when used as directed.    Naproxen                        Not Detected UNEXPECTED ==================================================================== Test                      Result    Flag   Units      Ref Range   Creatinine              43               mg/dL      >=79 ==================================================================== Declared Medications:  The flagging and interpretation on this report are based on the  following declared medications.  Unexpected results may arise from  inaccuracies in the declared medications.   **Note: The testing scope of this panel includes these medications:   Cyclobenzaprine (Flexeril)  Gabapentin (Neurontin)  Naproxen  (Naprosyn )  Oxycodone   Sertraline (Zoloft)  Tramadol (Ultram)   **Note: The testing scope of this panel does not include small to  moderate amounts of these reported medications:   Acetaminophen   Diclofenac (Voltaren)  Tizanidine (Zanaflex)   **Note: The testing scope of this panel does not include the  following reported  medications:   Azithromycin  (Zithromax )  Bisacodyl  Cephalexin  Clotrimazole (Lotrimin)  Dicyclomine (Bentyl)  Docusate (Colace)  Dulaglutide (Trulicity)  Empagliflozin  (Jardiance )  Fluconazole (Diflucan)  Fluticasone (Flonase)  Hydrochlorothiazide (Hyzaar)  Insulin  (Lantus)  Losartan  (Cozaar )  Losartan  (Hyzaar)  Meloxicam (Mobic)  Metformin (  Janumet)  Omeprazole (Prilosec)  Ondansetron  (Zofran )  Polyethylene Glycol  Prednisone  (Deltasone )  Sitagliptin (Janumet)  Sulfamethoxazole (Bactrim)  Supplement  Topical  Trimethoprim (Bactrim) ==================================================================== For clinical consultation, please call [phone]. ====================================================================     No results found for: CBDTHCR No results found for: D8THCCBX No results found for: D9THCCBX  ROS  Constitutional: Denies any fever or chills Gastrointestinal: No reported hemesis, hematochezia, vomiting, or acute GI distress Musculoskeletal: Denies any acute onset joint swelling, redness, loss of ROM, or weakness Neurological: No reported episodes of acute onset apraxia, aphasia, dysarthria, agnosia, amnesia, paralysis, loss of coordination, or loss of consciousness  Medication Review  DULoxetine, Glucose Blood, Insulin  Pen Needle, aspirin EC, dapagliflozin  propanediol, docusate sodium, fluticasone, furosemide , glucose blood, insulin  glargine, metoprolol  succinate, omeprazole, sacubitril -valsartan , spironolactone , and tiZANidine  History Review  Allergy: Kristy Cunningham is allergic to keflex [cephalexin] and jardiance  [empagliflozin ]. Drug: Kristy Cunningham  reports no history of drug use. Alcohol:  reports no history of alcohol use. Tobacco:  reports that she has never smoked. She has never used smokeless tobacco. Social: Kristy Cunningham  reports that she has never smoked. She has never used smokeless tobacco. She reports that she does not drink  alcohol and does not use drugs. Medical:  has a past medical history of Depression, Diabetes mellitus without complication (HCC), Hypertension, Low back strain (10/26/2022), and Pedal edema. Surgical: Kristy Cunningham  has a past surgical history that includes RIGHT/LEFT HEART CATH AND CORONARY ANGIOGRAPHY (Bilateral, 08/23/2023). Family: family history includes Breast cancer in her paternal grandmother.  Laboratory Chemistry Profile   Renal Lab Results  Component Value Date   BUN 33 (H) 09/10/2023   CREATININE 0.83 09/10/2023   BCR 31 (H) 08/02/2023   GFRNONAA >60 09/10/2023    Hepatic Lab Results  Component Value Date   AST 19 09/10/2023   ALT 23 09/10/2023   ALBUMIN 3.9 09/10/2023   ALKPHOS 96 09/10/2023   LIPASE 31 09/10/2023    Electrolytes Lab Results  Component Value Date   NA 133 (L) 09/10/2023   K 4.8 09/10/2023   CL 93 (L) 09/10/2023   CALCIUM 9.9 09/10/2023   MG 2.1 02/08/2023    Bone Lab Results  Component Value Date   25OHVITD1 32 02/08/2023   25OHVITD2 <1.0 02/08/2023   25OHVITD3 32 02/08/2023    Inflammation (CRP: Acute Phase) (ESR: Chronic Phase) Lab Results  Component Value Date   CRP 2 02/08/2023   ESRSEDRATE 68 (H) 02/08/2023   LATICACIDVEN 1.8 02/27/2021         Note: Above Lab results reviewed.  Recent Imaging Review  DG PAIN CLINIC C-ARM 1-60 MIN NO REPORT Fluoro was used, but no Radiologist interpretation will be provided.  Please refer to NOTES tab for provider progress note. Note: Reviewed        Physical Exam  Cunningham: BP (!) 162/83   Pulse 77   Temp (!) 97.3 F (36.3 C)   Ht 5' (1.524 m)   Wt 156 lb (70.8 kg)   LMP 05/11/2016 (Approximate)   SpO2 97%   BMI 30.47 kg/m  BMI: Estimated body mass index is 30.47 kg/m as calculated from the following:   Height as of this encounter: 5' (1.524 m).   Weight as of this encounter: 156 lb (70.8 kg). Ideal: Ideal body weight: 45.5 kg (100 lb 4.9 oz) Adjusted ideal body weight: 55.6 kg  (122 lb 9.4 oz) General appearance: Well nourished, well developed, and well hydrated. In no apparent acute distress Mental status:  Alert, oriented x 3 (person, place, & time)       Respiratory: No evidence of acute respiratory distress Eyes: PERLA Physical Exam   MEASUREMENTS: BMI- 30.47. EXTREMITIES: Feet swollen.       Assessment   Diagnosis Status  1. Lumbar radiculopathy (Left)   2. Chronic low back pain (Midline) (4th area of Pain) w/ sciatica (Left)   3. Lumbar facet joint pain   4. Muscle spasms of lower extremity (Left)   5. Spasm of muscle of lower back   6. Lumbar spondylosis   7. Spondylolisthesis, lumbar region   8. Diabetic peripheral neuropathy (HCC)   9. Chronic painful diabetic neuropathy (HCC)   10. Chronic feet pain (Bilateral)   11. Chronic feet swelling (Bilateral)   12. Burning pain of the feet (Bilateral)   13. Edema of lower extremities (Bilateral)   14. Chronic low back pain (Midline) w/o sciatica   15. Chronic low back pain (Bilateral) w/o sciatica   16. Postop check   17. Diabetic sensorimotor polyneuropathy (HCC)    Resolved Improved Persistent   Updated Problems: Problem  Burning pain of the feet (Bilateral)  Chronic low back pain (Midline) w/o sciatica  Chronic low back pain (Bilateral) w/o sciatica  Lumbar Facet Joint Pain  Diabetic Sensorimotor Polyneuropathy (Hcc)  Chronic feet pain (Bilateral)   The pain is described to be over the top and medial aspect of the foot.  She also describes swelling and allodynia.  Pain seems to follow the distribution of the L4 and L5 dermatomes.   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 A  recent nerve conduction test confirms this condition. The chronic nature of the neuropathy and the importance of diabetes management to slow its progression were discussed. There is a risk of foot injuries due to numbness, with potential complications such as infections, osteomyelitis, and amputation. Qutenza treatment can provide symptom relief for about six months and is repeatable. Initiate Qutenza treatment for diabetic peripheral neuropathy. Advise daily foot checks for injuries and educate on aggressive treatment of any foot injuries to prevent infection. Emphasize adherence to a diabetic diet and blood sugar control.  Bilateral lower extremity edema   She has bilateral lower extremity edema likely related to diabetic neuropathy.  Chronic low back pain with muscle spasm due to lumbar spondylosis and spondylolisthesis   Chronic low back pain with muscle spasms is exacerbated after a recent L3-4 LESI. The pain is axial, likely originating from facet joints rather than discs. Discussed the potential for radiofrequency ablation if a diagnostic lumbar facet block is successful. Emphasized weight management and avoiding activities that exacerbate pain. A diagnostic lumbar facet block will be performed without steroids to avoid affecting diabetes management. Plan a diagnostic lumbar facet block without steroids. Educate on weight management to reduce BMI to 30. Advise avoiding heavy lifting and twisting at the waist. Permit use of over-the-counter pain patches as needed.  Obesity   Her BMI is 30.47, slightly above the target of 30. Discussed the impact of obesity on back pain and the importance of weight loss to alleviate pressure on the spine and joints. Advise weight loss to reduce BMI to 30 and encourage lifestyle modifications to maintain weight  loss.       Kristy Cunningham has a current medication list which includes the following long-term medication(s): duloxetine, fluticasone, furosemide ,  lantus solostar, metoprolol  succinate, omeprazole, and spironolactone .  Pharmacotherapy (Medications Ordered): No orders of the defined types were placed in this encounter.  Orders:  Orders Placed This Encounter  Procedures   LUMBAR FACET(MEDIAL BRANCH NERVE BLOCK) MBNB    Diagnosis: Lumbar Facet Syndrome (M47.816); Lumbosacral Facet Syndrome (M47.817); Lumbar Facet Joint Pain (M54.59) Medical Necessity Statement: 1.Severe chronic axial low back pain causing functional impairment documented by ongoing pain scale assessments. 2.Pain present for longer than 3 months (Chronic) documented to have failed noninvasive conservative therapies. 3.Absence of untreated radiculopathy. 4.There is no radiological evidence of untreated fractures, tumor, infection, or deformity.  Physical Examination Findings: Positive Kemp Maneuver: (Y)  Positive Lumbar Hyperextension-Rotation provocative test: (Y)    Standing Status:   Future    Expiration Date:   03/28/2024    Scheduling Instructions:     Procedure: Lumbar facet Block     Type: Medial Branch Block     Side: Bilateral     Purpose: Diagnostic Radiologic Mapping     Level(s): L3-4, L4-5, L5-S1, and TBD by Fluoroscopic Mapping Facets (L2, L3, L4, L5, S1, and TBD Medial Branch)     Sedation: With Sedation.     Timeframe: ASAP    Where will this procedure be performed?:   [facility]   NEUROLYSIS    Please order Qutenza patches    Standing Status:   Future    Expiration Date:   03/28/2024    Where will this procedure be performed?:   [facility]   Nursing Instructions:    Please complete this patient's postprocedure evaluation.    Scheduling Instructions:     Please complete this patient's postprocedure evaluation.     Interventional Therapies  Risk Factors  Considerations  Medical Comorbidities:  Uncontrolled T2IDDM  CHF  Hx. Fluid Overload     Planned  Pending:   Diagnostic bilateral lumbar facet MBB #1  Therapeutic  bilateral lower extremity Qutenza treatment #1    Under consideration:   Diagnostic bilateral lumbar facet MBB #1 with possible follow-up radiofrequency ablation Therapeutic bilateral lower extremity Qutenza treatment #1    Completed:   Diagnostic/therapeutic left-sided L3-4 LESI x3 (11/28/2023) (100/100/50/50)    Therapeutic  Palliative (PRN) options:   None established   Completed by other providers:   (01/18/2023) diagnostic left sacroiliac joint injection by [facility] (12/14/2022) lower extremity EMG by [facility]     Return for (Clinic): (B) Qutenza Tx #1, w/ Emmy Blanch, NP.    Recent Visits Date Type Provider Dept  11/28/23 Procedure visit Tanya Glisson, MD [facility]  11/01/23 Office Visit Tanya Glisson, MD [facility]  Showing recent visits within past 90 days and meeting all other requirements Today's Visits Date Type Provider Dept  12/27/23 Office Visit Tanya Glisson, MD [facility]  Showing today's visits and meeting all other requirements Future Appointments No visits were found meeting these conditions. Showing future appointments within next 90 days and meeting all other requirements  I discussed the assessment and treatment plan with the patient. The patient was provided an opportunity to ask questions and all were answered. The patient agreed with the plan and demonstrated an understanding of the instructions.  Patient advised to call back or seek an in-person evaluation if the symptoms or condition worsens.  Duration of encounter: 44 minutes.  Total time  on encounter, as per AMA guidelines included both the face-to-face and non-face-to-face time personally spent by the physician and/or other qualified health care professional(s) on the day of the encounter (includes time in activities that require the physician or other qualified health care professional and does not include time in activities normally performed  by clinical staff). Physician's time may include the following activities when performed: Preparing to see the patient (e.g., pre-charting review of records, searching for previously ordered imaging, lab work, and nerve conduction tests) Review of prior analgesic pharmacotherapies. Reviewing PMP Interpreting ordered tests (e.g., lab work, imaging, nerve conduction tests) Performing post-procedure evaluations, including interpretation of diagnostic procedures Obtaining and/or reviewing separately obtained history Performing a medically appropriate examination and/or evaluation Counseling and educating the patient/family/caregiver Ordering medications, tests, or procedures Referring and communicating with other health care professionals (when not separately reported) Documenting clinical information in the electronic or other health record Independently interpreting results (not separately reported) and communicating results to the patient/ family/caregiver Care coordination (not separately reported)  Note by: Eric DELENA Como, MD (TTS and AI technology used. I apologize for any typographical errors that were not detected and corrected.) Date: 12/27/2023; Time: 9:30 AM

## 2023-12-27 ENCOUNTER — Ambulatory Visit: Attending: Pain Medicine | Admitting: Pain Medicine

## 2023-12-27 ENCOUNTER — Encounter: Payer: Self-pay | Admitting: Pain Medicine

## 2023-12-27 VITALS — BP 162/83 | HR 77 | Temp 97.3°F | Ht 60.0 in | Wt 156.0 lb

## 2023-12-27 DIAGNOSIS — E1140 Type 2 diabetes mellitus with diabetic neuropathy, unspecified: Secondary | ICD-10-CM | POA: Insufficient documentation

## 2023-12-27 DIAGNOSIS — M7989 Other specified soft tissue disorders: Secondary | ICD-10-CM | POA: Insufficient documentation

## 2023-12-27 DIAGNOSIS — M5442 Lumbago with sciatica, left side: Secondary | ICD-10-CM | POA: Insufficient documentation

## 2023-12-27 DIAGNOSIS — M5416 Radiculopathy, lumbar region: Secondary | ICD-10-CM | POA: Diagnosis present

## 2023-12-27 DIAGNOSIS — Z09 Encounter for follow-up examination after completed treatment for conditions other than malignant neoplasm: Secondary | ICD-10-CM | POA: Insufficient documentation

## 2023-12-27 DIAGNOSIS — R600 Localized edema: Secondary | ICD-10-CM | POA: Insufficient documentation

## 2023-12-27 DIAGNOSIS — R52 Pain, unspecified: Secondary | ICD-10-CM | POA: Insufficient documentation

## 2023-12-27 DIAGNOSIS — M79672 Pain in left foot: Secondary | ICD-10-CM | POA: Insufficient documentation

## 2023-12-27 DIAGNOSIS — M62838 Other muscle spasm: Secondary | ICD-10-CM | POA: Diagnosis present

## 2023-12-27 DIAGNOSIS — M62830 Muscle spasm of back: Secondary | ICD-10-CM | POA: Insufficient documentation

## 2023-12-27 DIAGNOSIS — M545 Low back pain, unspecified: Secondary | ICD-10-CM | POA: Insufficient documentation

## 2023-12-27 DIAGNOSIS — M5459 Other low back pain: Secondary | ICD-10-CM | POA: Diagnosis present

## 2023-12-27 DIAGNOSIS — M79671 Pain in right foot: Secondary | ICD-10-CM | POA: Diagnosis present

## 2023-12-27 DIAGNOSIS — G8929 Other chronic pain: Secondary | ICD-10-CM | POA: Diagnosis present

## 2023-12-27 DIAGNOSIS — M47816 Spondylosis without myelopathy or radiculopathy, lumbar region: Secondary | ICD-10-CM | POA: Diagnosis present

## 2023-12-27 DIAGNOSIS — M5126 Other intervertebral disc displacement, lumbar region: Secondary | ICD-10-CM

## 2023-12-27 DIAGNOSIS — M4726 Other spondylosis with radiculopathy, lumbar region: Secondary | ICD-10-CM | POA: Diagnosis not present

## 2023-12-27 DIAGNOSIS — Z794 Long term (current) use of insulin: Secondary | ICD-10-CM

## 2023-12-27 DIAGNOSIS — M4316 Spondylolisthesis, lumbar region: Secondary | ICD-10-CM | POA: Diagnosis present

## 2023-12-27 DIAGNOSIS — E1142 Type 2 diabetes mellitus with diabetic polyneuropathy: Secondary | ICD-10-CM | POA: Insufficient documentation

## 2023-12-27 NOTE — Patient Instructions (Addendum)
 Capsaicin Topical System What is this medication? CAPSAICIN (cap SAY sin) treats nerve pain. It works by making your skin feel warm or cool, which blocks pain signals going to the brain. This medicine may be used for other purposes; ask your health care provider or pharmacist if you have questions. COMMON BRAND NAME(S): Qutenza What should I tell my care team before I take this medication? They need to know if you have any of these conditions: Have had a heart attack or stroke High blood pressure Large areas of burned or damaged skin An unusual or allergic reaction to capsaicin, hot peppers, other medications, foods, dyes, or preservatives Pregnant or trying to get pregnant Breastfeeding How should I use this medication? This medication is for external use only. It is applied by your care team in a hospital or clinic setting. Talk to your care team about the use of this medication in children. Special care may be needed. Overdosage: If you think you have taken too much of this medicine contact a poison control center or emergency room at once. NOTE: This medicine is only for you. Do not share this medicine with others. What if I miss a dose? This does not apply. What may interact with this medication? Interactions are not expected. Do not use any other skin products on the affected area without asking your care team. This list may not describe all possible interactions. Give your health care provider a list of all the medicines, herbs, non-prescription drugs, or dietary supplements you use. Also tell them if you smoke, drink alcohol, or use illegal drugs. Some items may interact with your medicine. What should I watch for while using this medication? Your condition will be monitored carefully while you are receiving this medication. Tell your care team if your symptoms do not start to get better or if they get worse. Talk to your care team about how to treat discomfort. You may place a  cooling pack from the refrigerator (not the freezer) on the area. Do not place it directly on the skin. Try not to touch the area where the patch was applied. If you do, wash your hands with soap and water right away. Your skin may be sensitive to heat for a few days after treatment. Avoid hot baths or showers, heating pads, and direct sunlight on the treated area. What side effects may I notice from receiving this medication? Side effects that you should report to your care team as soon as possible: Allergic reactions--skin rash, itching, hives, swelling of the face, lips, tongue, or throat Burning, itching, crusting, or peeling of treated skin Increase in blood pressure Numbness, decrease in sense of touch or sensation Side effects that usually do not require medical attention (report these to your care team if they continue or are bothersome): Mild skin irritation, redness, or dryness This list may not describe all possible side effects. Call your doctor for medical advice about side effects. You may report side effects to FDA at 1-800-FDA-1088. Where should I keep my medication? This medication is given in a hospital or clinic. It will not be stored at home. NOTE: This sheet is a summary. It may not cover all possible information. If you have questions about this medicine, talk to your doctor, pharmacist, or health care provider.  2024 Elsevier/Gold Standard (2023-02-24 00:00:00)  ______________________________________________________________________    Procedure instructions  Stop blood-thinners  Do not eat or drink fluids (other than water) for 6 hours before your procedure  No water for  2 hours before your procedure  Take your blood pressure medicine with a sip of water  Arrive 30 minutes before your appointment  If sedation is planned, bring suitable driver. [employer], [employer], & public transportation are NOT APPROVED)  Carefully read the Preparing for your procedure detailed  instructions  If you have questions call us  at [phone]  Procedure appointments are for procedures only.   NO medication refills or new problem evaluations will be done on procedure days.   Only the scheduled, pre-approved procedure and side will be done.   ______________________________________________________________________     ______________________________________________________________________    Preparing for your procedure  Appointments: If you think you may not be able to keep your appointment, call 24-48 hours in advance to cancel. We need time to make it available to others.  Procedure visits are for procedures only. During your procedure appointment there will be: NO Prescription Refills*. NO medication changes or discussions*. NO discussion of disability issues*. NO unrelated pain problem evaluations*. NO evaluations to order other pain procedures*. *These will be addressed at a separate and distinct evaluation encounter on the provider's evaluation schedule and not during procedure days.  Instructions: Food intake: Avoid eating anything solid for at least 8 hours prior to your procedure. Clear liquid intake: You may take clear liquids such as water up to 2 hours prior to your procedure. (No carbonated drinks. No soda.) Transportation: Unless otherwise stated by your physician, bring a driver. (Driver cannot be a Market researcher, Pharmacist, community, or any other form of public transportation.) Morning Medicines: Except for blood thinners, take all of your other morning medications with a sip of water. Make sure to take your heart and blood pressure medicines. If your blood pressure's lower number is above 100, the case will be rescheduled. Blood thinners: Make sure to stop your blood thinners as instructed.  If you take a blood thinner, but were not instructed to stop it, call our office [phone] and ask to talk to a nurse. Not stopping a blood thinner prior to certain procedures  could lead to serious complications. Diabetics on insulin : Notify the staff so that you can be scheduled 1st case in the morning. If your diabetes requires high dose insulin , take only  of your normal insulin  dose the morning of the procedure and notify the staff that you have done so. Preventing infections: Shower with an antibacterial soap the morning of your procedure.  Build-up your immune system: Take 1000 mg of Vitamin C with every meal (3 times a day) the day prior to your procedure. Antibiotics: Inform the nursing staff if you are taking any antibiotics or if you have any conditions that may require antibiotics prior to procedures. (Example: recent joint implants)   Pregnancy: If you are pregnant make sure to notify the nursing staff. Not doing so may result in injury to the fetus, including death.  Sickness: If you have a cold, fever, or any active infections, call and cancel or reschedule your procedure. Receiving steroids while having an infection may result in complications. Arrival: You must be in the facility at least 30 minutes prior to your scheduled procedure. Tardiness: Your scheduled time is also the cutoff time. If you do not arrive at least 15 minutes prior to your procedure, you will be rescheduled.  Children: Do not bring any children with you. Make arrangements to keep them home. Dress appropriately: There is always a possibility that your clothing may get soiled. Avoid long dresses. Valuables: Do not bring any  jewelry or valuables.  Reasons to call and reschedule or cancel your procedure: (Following these recommendations will minimize the risk of a serious complication.) Surgeries: Avoid having procedures within 2 weeks of any surgery. (Avoid for 2 weeks before or after any surgery). Flu Shots: Avoid having procedures within 2 weeks of a flu shots or . (Avoid for 2 weeks before or after immunizations). Barium: Avoid having a procedure within 7-10 days after having had a  radiological study involving the use of radiological contrast. (Myelograms, Barium swallow or enema study). Heart attacks: Avoid any elective procedures or surgeries for the initial 6 months after a Myocardial Infarction (Heart Attack). Blood thinners: It is imperative that you stop these medications before procedures. Let us  know if you if you take any blood thinner.  Infection: Avoid procedures during or within two weeks of an infection (including chest colds or gastrointestinal problems). Symptoms associated with infections include: Localized redness, fever, chills, night sweats or profuse sweating, burning sensation when voiding, cough, congestion, stuffiness, runny nose, sore throat, diarrhea, nausea, vomiting, cold or Flu symptoms, recent or current infections. It is specially important if the infection is over the area that we intend to treat. Heart and lung problems: Symptoms that may suggest an active cardiopulmonary problem include: cough, chest pain, breathing difficulties or shortness of breath, dizziness, ankle swelling, uncontrolled high or unusually low blood pressure, and/or palpitations. If you are experiencing any of these symptoms, cancel your procedure and contact your primary care physician for an evaluation.  Remember:  Regular Business hours are:  Monday to Thursday 8:00 AM to 4:00 PM  Provider's Schedule: Eric Como, MD:  Procedure days: Tuesday and Thursday 7:30 AM to 4:00 PM  Wallie Sherry, MD:  Procedure days: Monday and Wednesday 7:30 AM to 4:00 PM Last  Updated: 03/07/2023 ______________________________________________________________________     ______________________________________________________________________    General Risks and Possible Complications  Patient Responsibilities: It is important that you read this as it is part of your informed consent. It is our duty to inform you of the risks and possible complications associated with treatments  offered to you. It is your responsibility as a patient to read this and to ask questions about anything that is not clear or that you believe was not covered in this document.  Patient's Rights: You have the right to refuse treatment. You also have the right to change your mind, even after initially having agreed to have the treatment done. However, under this last option, if you wait until the last second to change your mind, you may be charged for the materials used up to that point.  Introduction: Medicine is not an Visual merchandiser. Everything in Medicine, including the lack of treatment(s), carries the potential for danger, harm, or loss (which is by definition: Risk). In Medicine, a complication is a secondary problem, condition, or disease that can aggravate an already existing one. All treatments carry the risk of possible complications. The fact that a side effects or complications occurs, does not imply that the treatment was conducted incorrectly. It must be clearly understood that these can happen even when everything is done following the highest safety standards.  No treatment: You can choose not to proceed with the proposed treatment alternative. The "PRO(s)" would include: avoiding the risk of complications associated with the therapy. The "CON(s)" would include: not getting any of the treatment benefits. These benefits fall under one of three categories: diagnostic; therapeutic; and/or palliative. Diagnostic benefits include: getting information which can ultimately lead  to improvement of the disease or symptom(s). Therapeutic benefits are those associated with the successful treatment of the disease. Finally, palliative benefits are those related to the decrease of the primary symptoms, without necessarily curing the condition (example: decreasing the pain from a flare-up of a chronic condition, such as incurable terminal cancer).  General Risks and Complications: These are associated to most  interventional treatments. They can occur alone, or in combination. They fall under one of the following six (6) categories: no benefit or worsening of symptoms; bleeding; infection; nerve damage; allergic reactions; and/or death. No benefits or worsening of symptoms: In Medicine there are no guarantees, only probabilities. No healthcare provider can ever guarantee that a medical treatment will work, they can only state the probability that it may. Furthermore, there is always the possibility that the condition may worsen, either directly, or indirectly, as a consequence of the treatment. Bleeding: This is more common if the patient is taking a blood thinner, either prescription or over the counter (example: Goody Powders, Fish oil, Aspirin, Garlic, etc.), or if suffering a condition associated with impaired coagulation (example: Hemophilia, cirrhosis of the liver, low platelet counts, etc.). However, even if you do not have one on these, it can still happen. If you have any of these conditions, or take one of these drugs, make sure to notify your treating physician. Infection: This is more common in patients with a compromised immune system, either due to disease (example: diabetes, cancer, human immunodeficiency virus [HIV], etc.), or due to medications or treatments (example: therapies used to treat cancer and rheumatological diseases). However, even if you do not have one on these, it can still happen. If you have any of these conditions, or take one of these drugs, make sure to notify your treating physician. Nerve Damage: This is more common when the treatment is an invasive one, but it can also happen with the use of medications, such as those used in the treatment of cancer. The damage can occur to small secondary nerves, or to large primary ones, such as those in the spinal cord and brain. This damage may be temporary or permanent and it may lead to impairments that can range from temporary numbness to  permanent paralysis and/or brain death. Allergic Reactions: Any time a substance or material comes in contact with our body, there is the possibility of an allergic reaction. These can range from a mild skin rash (contact dermatitis) to a severe systemic reaction (anaphylactic reaction), which can result in death. Death: In general, any medical intervention can result in death, most of the time due to an unforeseen complication. ______________________________________________________________________     ______________________________________________________________________    Appointment Information  It is our goal and responsibility to provide the medical community with assistance in the evaluation and management of patients with chronic pain. Unfortunately our resources are limited. Because we do not have an unlimited amount of time, or available appointments, we are required to closely monitor for unkept or cancelled appointments.  Patient's responsibilities: 1. Punctuality: Patients are required to be physically present in our office at least 15 minutes before their scheduled appointment. 2. Tardiness: Patients not physically present in our office at their scheduled appointment time will be rescheduled. 3. Plan ahead: Assume that you will encounter traffic and plan to arrive 30 minutes before your appointment. 4. Other appointments and responsibilities: Do not schedule other appointments immediately before or after your scheduled appointment.  5. Be prepared: Make a list of everything that you need  to discuss with your provider so that you use your time efficiently. Once the provider leaves your room, he/she will not return to your room to discuss anything that you neglected to bring up during your allowed time. 6. No children or pets: Do not bring children or pets to your appointment. 7. Cancelling or rescheduling your appointment: Advanced notification (more than 24 hours in advance) is  required. 8. No Show: Not calling to cancel an appointment and simply not showing up is unacceptable. This leads to loss of appointments that could have been used by a patient in need. (See below)  Corrective process for repeat offenders:  No Shows: Three (3) No Shows within a 12 month period will result in an automatic discharge from our practice. Rescheduling or cancelling with more than 24 hours notice will not be penalized and will not count against you. Tardiness: If you have to be rescheduled three (3) times due to late arrivals, it will be counted as one (1) No Show. Cancellation or reschedule: Three (3) cancellations or rescheduling where notice was given with less than 24 hours in advance, will be recorded as one (1) No Show.  Types of appointments: New patient initial evaluation: These are evaluations only. Your initial patient questionnaire will be collected and entered into the system. A history of present illness will be taken. Prior lab work, imaging studies, and associated treatments will be reviewed. The provider may order appropriate diagnostic testing depending on their evaluation and review of available information. No treatments will be started on this visit. 2nd Follow-up visit: During this visit your provider will inform you of the results of the diagnostic tests ordered on the initial evaluation. Based on the providers assessment, treatment options will be offered, at which the patient will decide if he/she is interested in the alternatives. If interested, a treatment plan will be established and started. Procedure visits: Post-procedure evaluation visits: Evaluation visits MM New problems Flare-up evaluations Follow-up after diagnostic testing ______________________________________________________________________

## 2023-12-27 NOTE — Progress Notes (Signed)
 Safety precautions to be maintained throughout the outpatient stay will include: orient to surroundings, keep bed in low position, maintain call bell within reach at all times, provide assistance with transfer out of bed and ambulation.

## 2024-01-18 ENCOUNTER — Ambulatory Visit (HOSPITAL_BASED_OUTPATIENT_CLINIC_OR_DEPARTMENT_OTHER): Admitting: Pain Medicine

## 2024-01-18 ENCOUNTER — Ambulatory Visit
Admission: RE | Admit: 2024-01-18 | Discharge: 2024-01-18 | Disposition: A | Discharge status: Home / Self Care | Source: Ambulatory Visit | Attending: Pain Medicine | Admitting: Pain Medicine

## 2024-01-18 ENCOUNTER — Encounter: Payer: Self-pay | Admitting: Pain Medicine

## 2024-01-18 VITALS — BP 175/92 | HR 81 | Temp 97.2°F | Resp 13 | Ht 60.0 in | Wt 162.0 lb

## 2024-01-18 DIAGNOSIS — G8929 Other chronic pain: Secondary | ICD-10-CM

## 2024-01-18 DIAGNOSIS — E1165 Type 2 diabetes mellitus with hyperglycemia: Secondary | ICD-10-CM | POA: Insufficient documentation

## 2024-01-18 DIAGNOSIS — M545 Low back pain, unspecified: Secondary | ICD-10-CM

## 2024-01-18 DIAGNOSIS — M5459 Other low back pain: Secondary | ICD-10-CM

## 2024-01-18 DIAGNOSIS — Z794 Long term (current) use of insulin: Secondary | ICD-10-CM | POA: Insufficient documentation

## 2024-01-18 DIAGNOSIS — M47816 Spondylosis without myelopathy or radiculopathy, lumbar region: Secondary | ICD-10-CM | POA: Diagnosis present

## 2024-01-18 DIAGNOSIS — E119 Type 2 diabetes mellitus without complications: Secondary | ICD-10-CM | POA: Diagnosis present

## 2024-01-18 MED ORDER — LIDOCAINE HCL 2 % IJ SOLN
INTRAMUSCULAR | Status: AC
  Filled 2024-01-18: qty 20

## 2024-01-18 MED ORDER — MIDAZOLAM HCL 5 MG/5ML IJ SOLN
INTRAMUSCULAR | Status: AC
  Filled 2024-01-18: qty 5

## 2024-01-18 MED ORDER — LIDOCAINE HCL 2 % IJ SOLN
20.0000 mL | Freq: Once | INTRAMUSCULAR | Status: AC
  Administered 2024-01-18: 400 mg

## 2024-01-18 MED ORDER — TRIAMCINOLONE ACETONIDE 40 MG/ML IJ SUSP
INTRAMUSCULAR | Status: AC
  Filled 2024-01-18: qty 2

## 2024-01-18 MED ORDER — ROPIVACAINE HCL 2 MG/ML IJ SOLN
INTRAMUSCULAR | Status: AC
  Filled 2024-01-18: qty 20

## 2024-01-18 MED ORDER — FENTANYL CITRATE (PF) 100 MCG/2ML IJ SOLN
25.0000 ug | INTRAMUSCULAR | Status: DC | PRN
  Administered 2024-01-18: 50 ug via INTRAVENOUS

## 2024-01-18 MED ORDER — PENTAFLUOROPROP-TETRAFLUOROETH EX AERO
INHALATION_SPRAY | Freq: Once | CUTANEOUS | Status: AC
  Administered 2024-01-18: 30 via TOPICAL

## 2024-01-18 MED ORDER — MIDAZOLAM HCL 5 MG/5ML IJ SOLN
0.5000 mg | Freq: Once | INTRAMUSCULAR | Status: AC
  Administered 2024-01-18: 2 mg via INTRAVENOUS

## 2024-01-18 MED ORDER — ROPIVACAINE HCL 2 MG/ML IJ SOLN
18.0000 mL | Freq: Once | INTRAMUSCULAR | Status: AC
  Administered 2024-01-18: 18 mL via PERINEURAL

## 2024-01-18 MED ORDER — FENTANYL CITRATE (PF) 100 MCG/2ML IJ SOLN
INTRAMUSCULAR | Status: AC
  Filled 2024-01-18: qty 2

## 2024-01-18 NOTE — Progress Notes (Signed)
 PROVIDER NOTE: Interpretation of information contained herein should be left to medically-trained personnel. Specific patient instructions are provided elsewhere under Patient Instructions section of medical record. This document was created in part using STT-dictation technology, any transcriptional errors that may result from this process are unintentional.  Patient: Kristy Cunningham Type: Established DOB: [birth year] MRN: [id number] PCP: Era Raisin, NP  Service: Procedure DOS: 01/18/2024 Setting: Ambulatory Location: Ambulatory outpatient facility Delivery: Face-to-face Provider: Eric DELENA Como, MD Specialty: Interventional Pain Management Specialty designation: 09 Location: Outpatient facility Ref. Prov.: Como Eric, MD       Interventional Therapy   Type: Lumbar Facet, Medial Branch Block(s) (w/ fluoroscopic mapping) #1 (NO STEROIDS) Laterality: Bilateral  Level: L2, L3, L4, L5, and S1 Medial Branch/Dorsal Rami Level(s). Injecting these levels blocks the L3-4, L4-5, and L5-S1 lumbar facet joints. Imaging: Fluoroscopic guidance Spinal ([id number]) Anesthesia: Local anesthesia (1-2% Lidocaine ) Anxiolysis: IV Versed  2.0 mg Sedation: Minimal Sedation Fentanyl  1 mL (50 mcg) DOS: 01/18/2024 Performed by: Eric DELENA Como, MD  Primary Purpose: Diagnostic Indications: Low back pain severe enough to impact quality of life or function. 1. Chronic low back pain (Bilateral) w/o sciatica   2. Lumbar facet joint pain   3. Lumbar facet arthropathy   4. Arthropathy of lumbar facet joint (Bilateral) (L3-4, L4-5)   5. Spondylosis without myelopathy or radiculopathy, lumbar region   6. Diabetes mellitus, type II, insulin  dependent (HCC)   7. Uncontrolled type 2 diabetes mellitus with hyperglycemia (HCC)   8. Chronic low back pain (Midline) w/o sciatica    NAS-11 Pain score:   Pre-procedure: 6 /10   Post-procedure: 0-No pain/10     Position / Prep / Materials:   Position: Prone  Prep solution: ChloraPrep (2% chlorhexidine gluconate and 70% isopropyl alcohol) Area Prepped: Posterolateral Lumbosacral Spine (Wide prep: From the lower border of the scapula down to the end of the tailbone and from flank to flank.)  Materials:  Tray: Block Needle(s):  Type: Spinal  Gauge (G): 22  Length: 5-in Qty: 4     H&P (Pre-op Assessment):  Kristy Cunningham is a 53 y.o. (year old), female patient, seen today for interventional treatment. She  has a past surgical history that includes RIGHT/LEFT HEART CATH AND CORONARY ANGIOGRAPHY (Bilateral, 08/23/2023). Kristy Cunningham has a current medication list which includes the following prescription(s): bupropion, cetirizine, docusate sodium, duloxetine, fluticasone, furosemide , glucose blood, glucose blood, unifine pentips, lantus solostar, metoprolol  succinate, mounjaro, omeprazole, entresto , tizanidine, aspirin ec, dapagliflozin  propanediol, and spironolactone , and the following Facility-Administered Medications: fentanyl . Her primarily concern today is the Back Pain (lower)  Initial Vital Signs:  Pulse/HCG Rate: 81ECG Heart Rate: 90 Temp: (!) 97.1 F (36.2 C) Resp: 18 BP: (!) 178/96 SpO2: 98 %  BMI: Estimated body mass index is 31.64 kg/m as calculated from the following:   Height as of this encounter: 5' (1.524 m).   Weight as of this encounter: 162 lb (73.5 kg).  Risk Assessment: Allergies: Reviewed. She is allergic to keflex [cephalexin] and jardiance  [empagliflozin ].  Allergy Precautions: None required Coagulopathies: Reviewed. None identified.  Blood-thinner therapy: None at this time Active Infection(s): Reviewed. None identified. Kristy Cunningham is afebrile  Site Confirmation: Kristy Cunningham was asked to confirm the procedure and laterality before marking the site Procedure checklist: Completed Consent: Before the procedure and under the influence of no sedative(s), amnesic(s), or anxiolytics, the patient was  informed of the treatment options, risks and possible complications. To fulfill our ethical and legal obligations, as recommended by the American Medical  Association's Code of Ethics, I have informed the patient of my clinical impression; the nature and purpose of the treatment or procedure; the risks, benefits, and possible complications of the intervention; the alternatives, including doing nothing; the risk(s) and benefit(s) of the alternative treatment(s) or procedure(s); and the risk(s) and benefit(s) of doing nothing. The patient was provided information about the general risks and possible complications associated with the procedure. These may include, but are not limited to: failure to achieve desired goals, infection, bleeding, organ or nerve damage, allergic reactions, paralysis, and death. In addition, the patient was informed of those risks and complications associated to Spine-related procedures, such as failure to decrease pain; infection (i.e.: Meningitis, epidural or intraspinal abscess); bleeding (i.e.: epidural hematoma, subarachnoid hemorrhage, or any other type of intraspinal or peri-dural bleeding); organ or nerve damage (i.e.: Any type of peripheral nerve, nerve root, or spinal cord injury) with subsequent damage to sensory, motor, and/or autonomic systems, resulting in permanent pain, numbness, and/or weakness of one or several areas of the body; allergic reactions; (i.e.: anaphylactic reaction); and/or death. Furthermore, the patient was informed of those risks and complications associated with the medications. These include, but are not limited to: allergic reactions (i.e.: anaphylactic or anaphylactoid reaction(s)); adrenal axis suppression; blood sugar elevation that in diabetics may result in ketoacidosis or comma; water retention that in patients with history of congestive heart failure may result in shortness of breath, pulmonary edema, and decompensation with resultant heart  failure; weight gain; swelling or edema; medication-induced neural toxicity; particulate matter embolism and blood vessel occlusion with resultant organ, and/or nervous system infarction; and/or aseptic necrosis of one or more joints. Finally, the patient was informed that Medicine is not an exact science; therefore, there is also the possibility of unforeseen or unpredictable risks and/or possible complications that may result in a catastrophic outcome. The patient indicated having understood very clearly. We have given the patient no guarantees and we have made no promises. Enough time was given to the patient to ask questions, all of which were answered to the patient's satisfaction. Ms. Osorto has indicated that she wanted to continue with the procedure. Attestation: I, the ordering provider, attest that I have discussed with the patient the benefits, risks, side-effects, alternatives, likelihood of achieving goals, and potential problems during recovery for the procedure that I have provided informed consent. Date  Time: 01/18/2024  9:37 AM  Pre-Procedure Preparation:  Monitoring: As per clinic protocol. Respiration, ETCO2, SpO2, BP, heart rate and rhythm monitor placed and checked for adequate function Safety Precautions: Patient was assessed for positional comfort and pressure points before starting the procedure. Time-out: I initiated and conducted the Time-out before starting the procedure, as per protocol. The patient was asked to participate by confirming the accuracy of the Time Out information. Verification of the correct person, site, and procedure were performed and confirmed by me, the nursing staff, and the patient. Time-out conducted as per Joint Commission's Universal Protocol (UP.01.01.01). Time: 0957 Start Time: 0957 hrs.  Description of Procedure:          Laterality: (see above) Targeted Levels: (see above)  Safety Precautions: Aspiration looking for blood return was  conducted prior to all injections. At no point did we inject any substances, as a needle was being advanced. Before injecting, the patient was told to immediately notify me if she was experiencing any new onset of ringing in the ears, or metallic taste in the mouth. No attempts were made at seeking any paresthesias. Safe injection  practices and needle disposal techniques used. Medications properly checked for expiration dates. SDV (single dose vial) medications used. After the completion of the procedure, all disposable equipment used was discarded in the proper designated medical waste containers. Local Anesthesia: Protocol guidelines were followed. The patient was positioned over the fluoroscopy table. The area was prepped in the usual manner. The time-out was completed. The target area was identified using fluoroscopy. A 12-in long, straight, sterile hemostat was used with fluoroscopic guidance to locate the targets for each level blocked. Once located, the skin was marked with an approved surgical skin marker. Once all sites were marked, the skin (epidermis, dermis, and hypodermis), as well as deeper tissues (fat, connective tissue and muscle) were infiltrated with a small amount of a short-acting local anesthetic, loaded on a 10cc syringe with a 25G, 1.5-in  Needle. An appropriate amount of time was allowed for local anesthetics to take effect before proceeding to the next step. Local Anesthetic: Lidocaine  2.0% The unused portion of the local anesthetic was discarded in the proper designated containers. Technical description of process:  Medial Branch  Dorsal Rami Nerve Block (MBB):  Neuroanatomy note: Each lumbar facet joint receives dual innervation from medial branches arising from the posterior primary rami at the same level and one level above. The target for each lumbar medial branch is the junction of the ipsilateral superior articular and transverse process of the lower vertebral body. (i.e.:  The L4-L5 facet joint is innervated by the L4 medial branch [located at L5] and the L3 medial branch [located at L4]. Blocking the L4 Medial Branch is therefore achieved by injecting at the junction of the ipsilateral superior articular and transverse process of the lower vertebral body [L5].).  Exception: The exception to the above rule is the L5-S1 facet joint which has triple innervation requiring the L4 medial branch, as well as the L5 and the S1 Dorsal Rami(s) to be blocked to fully denervate the joint.  Under fluoroscopic guidance, a needle was inserted until contact was made with os over the target area. After negative aspiration, 0.5 mL of the nerve block solution was injected without difficulty or complication. Paresthesia were avoided during injection. The needle(s) were removed intact and without complication.  Once the entire procedure was completed, the treated area was cleaned, making sure to leave some of the prepping solution back to take advantage of its long term bactericidal properties.         Illustration of the posterior view of the lumbar spine and the posterior neural structures. Laminae of L2 through S1 are labeled. DPRL5, dorsal primary ramus of L5; DPRS1, dorsal primary ramus of S1; DPR3, dorsal primary ramus of L3; FJ, facet (zygapophyseal) joint L3-L4; I, inferior articular process of L4; LB1, lateral branch of dorsal primary ramus of L1; IAB, inferior articular branches from L3 medial branch (supplies L4-L5 facet joint); IBP, intermediate branch plexus; MB3, medial branch of dorsal primary ramus of L3; NR3, third lumbar nerve root; S, superior articular process of L5; SAB, superior articular branches from L4 (supplies L4-5 facet joint also); TP3, transverse process of L3.   Facet Joint Innervation (* possible contribution)  L1-2 T12, L1 (L2*)  Medial Branch  L2-3 L1, L2 (L3*)                     L3-4 L2, L3 (L4*)                     L4-5 L3, L4 (  L5*)                      L5-S1 L4, L5, S1                        Vitals:   01/18/24 1014 01/18/24 1017 01/18/24 1020 01/18/24 1030  BP: (!) 156/82 (!) 160/93  (!) 175/92  Pulse:      Resp: 16 (!) 9  13  Temp: 97.6 F (36.4 C)   (!) 97.2 F (36.2 C)  TempSrc: Temporal   Temporal  SpO2: 93% (!) 89% 100% 100%  Weight:      Height:         End Time: 1007 hrs.  Imaging Guidance (Spinal):         Type of Imaging Technique: Fluoroscopy Guidance (Spinal) Indication(s): Fluoroscopy guidance for needle placement to enhance accuracy in procedures requiring precise needle localization for targeted delivery of medication in or near specific anatomical locations not easily accessible without such real-time imaging assistance. Exposure Time: Please see nurses notes. Contrast: None used. Fluoroscopic Guidance: I was personally present during the use of fluoroscopy. Tunnel Vision Technique used to obtain the best possible view of the target area. Parallax error corrected before commencing the procedure. Direction-depth-direction technique used to introduce the needle under continuous pulsed fluoroscopy. Once target was reached, antero-posterior, oblique, and lateral fluoroscopic projection used confirm needle placement in all planes. Images permanently stored in EMR. Interpretation: No contrast injected. I personally interpreted the imaging intraoperatively. Adequate needle placement confirmed in multiple planes. Permanent images saved into the patient's record.  Post-operative Assessment:  Post-procedure Vital Signs:  Pulse/HCG Rate: 8186 Temp: (!) 97.2 F (36.2 C) Resp: 13 BP: (!) 175/92 SpO2: 100 %  EBL: None  Complications: No immediate post-treatment complications observed by team, or reported by patient.  Note: The patient tolerated the entire procedure well. A repeat set of vitals were taken after the procedure and the patient was kept under observation following institutional policy, for this  type of procedure. Post-procedural neurological assessment was performed, showing return to baseline, prior to discharge. The patient was provided with post-procedure discharge instructions, including a section on how to identify potential problems. Should any problems arise concerning this procedure, the patient was given instructions to immediately contact us , at any time, without hesitation. In any case, we plan to contact the patient by telephone for a follow-up status report regarding this interventional procedure.  Comments:  No additional relevant information.  Plan of Care (POC)  Orders:  Orders Placed This Encounter  Procedures   LUMBAR FACET(MEDIAL BRANCH NERVE BLOCK) MBNB    Scheduling Instructions:     Procedure: Lumbar facet block (AKA.: Lumbosacral medial branch nerve block)     Side: Bilateral     Level: L3-4, L4-5, and L5-S1 Facets (L2, L3, L4, L5, and S1 Medial Branch Nerves)     Sedation: Patient's choice.     Date: 01/18/2024    Where will this procedure be performed?:   [facility]   [facility] C-ARM 1-60 MIN NO REPORT    Intraoperative interpretation by procedural physician at [facility].    Standing Status:   Standing    Number of Occurrences:   1    Reason for exam::   Assistance in needle guidance and placement for procedures requiring needle placement in or near specific anatomical locations not easily accessible without such assistance.   Informed Consent Details:  Physician/Practitioner Attestation; Transcribe to consent form and obtain patient signature    Nursing Order: Transcribe to consent form and obtain patient signature. Note: Always confirm laterality of pain with Ms. Wimbish, before procedure.    Physician/Practitioner attestation of informed consent for procedure/surgical case:   I, the physician/practitioner, attest that I have discussed with the patient the benefits, risks, side effects, alternatives, likelihood of achieving  goals and potential problems during recovery for the procedure that I have provided informed consent.    Procedure:   Lumbar Facet Block  under fluoroscopic guidance    Physician/Practitioner performing the procedure:   Janiyha Montufar A. Tanya MD    Indication/Reason:   Low Back Pain, with our without leg pain, due to Facet Joint Arthralgia (Joint Pain) Spondylosis (Arthritis of the Spine), without myelopathy or radiculopathy (Nerve Damage).   Provide equipment / supplies at bedside    Procedure tray: Block Tray (Disposable  single use) Skin infiltration needle: Regular 1.5-in, 25-G, (x1) Block Needle type: Spinal Amount/quantity: 4 Size: Medium (5-inch) Gauge: 22G    Standing Status:   Standing    Number of Occurrences:   1    Specify:   Block Tray   Saline lock IV    Have LR [phone] mL available and administer at 125 mL/hr if patient becomes hypotensive.    Standing Status:   Standing    Number of Occurrences:   1     No chronic opioid analgesics therapy prescribed by our practice. None MME/day: 0 mg/day    Medications ordered for procedure: Meds ordered this encounter  Medications   lidocaine  (XYLOCAINE ) 2 % (with pres) injection 400 mg   pentafluoroprop-tetrafluoroeth (GEBAUERS) aerosol   midazolam  (VERSED ) 5 MG/5ML injection 0.5-2 mg    Make sure Flumazenil is available in the pyxis when using this medication. If oversedation occurs, administer 0.2 mg IV over 15 sec. If after 45 sec no response, administer 0.2 mg again over 1 min; may repeat at 1 min intervals; not to exceed 4 doses (1 mg)   fentaNYL  (SUBLIMAZE ) injection 25-50 mcg    Make sure Narcan is available in the pyxis when using this medication. In the event of respiratory depression (RR< 8/min): Titrate NARCAN (naloxone) in increments of 0.1 to 0.2 mg IV at 2-3 minute intervals, until desired degree of reversal.   ropivacaine  (PF) 2 mg/mL (0.2%) (NAROPIN ) injection 18 mL   Medications administered: We administered  lidocaine , pentafluoroprop-tetrafluoroeth, midazolam , fentaNYL , and ropivacaine  (PF) 2 mg/mL (0.2%).  See the medical record for exact dosing, route, and time of administration.    Interventional Therapies  Risk Factors  Considerations  Medical Comorbidities:  Uncontrolled T2IDDM  CHF  Hx. Fluid Overload     Planned  Pending:   Diagnostic bilateral lumbar facet MBB #1  Therapeutic bilateral lower extremity Qutenza treatment #1    Under consideration:   Diagnostic bilateral lumbar facet MBB #1 with possible follow-up radiofrequency ablation Therapeutic bilateral lower extremity Qutenza treatment #1    Completed:   Diagnostic/therapeutic left-sided L3-4 LESI x3 (11/28/2023) (100/100/50/50)    Therapeutic  Palliative (PRN) options:   None established   Completed by other providers:   (01/18/2023) diagnostic left sacroiliac joint injection by [facility] (12/14/2022) lower extremity EMG by [facility]      Follow-up plan:   Return in about 1 week (around 01/25/2024) for (Face2F), (PPE) (procedure done w/o steroids).     Recent Visits Date Type Provider Dept  12/27/23 Office Visit Tanya Glisson, MD [facility]  11/28/23 Procedure visit Tanya Glisson, MD [facility]  11/01/23 Office Visit Tanya Glisson, MD [facility]  Showing recent visits within past 90 days and meeting all other requirements Today's Visits Date Type Provider Dept  01/18/24 Procedure visit Tanya Glisson, MD [facility]  Showing today's visits and meeting all other requirements Future Appointments Date Type Provider Dept  02/07/24 Appointment Tanya Glisson, MD [facility]  Showing future appointments within next 90 days and meeting all other requirements   Disposition: Discharge home  Discharge (Date  Time): 01/18/2024;   hrs.   Primary Care Physician: Era Raisin, NP Location: [facility]  Facility Note by: Glisson DELENA Tanya, MD (TTS technology used. I apologize for any typographical errors that were not detected and corrected.) Date: 01/18/2024; Time: 11:50 AM  Disclaimer:  Medicine is not an Visual merchandiser. The only guarantee in medicine is that nothing is guaranteed. It is important to note that the decision to proceed with this intervention was based on the information collected from the patient. The Data and conclusions were drawn from the patient's questionnaire, the interview, and the physical examination. Because the information was provided in large part by the patient, it cannot be guaranteed that it has not been purposely or unconsciously manipulated. Every effort has been made to obtain as much relevant data as possible for this evaluation. It is important to note that the conclusions that lead to this procedure are derived in large part from the available data. Always take into account that the treatment will also be dependent on availability of resources and existing treatment guidelines, considered by other Pain Management Practitioners as being common knowledge and practice, at the time of the intervention. For Medico-Legal purposes, it is also important to point out that variation in procedural techniques and pharmacological choices are the acceptable norm. The indications, contraindications, technique, and results of the above procedure should only be interpreted and judged by a Board-Certified Interventional Pain Specialist with extensive familiarity and expertise in the same exact procedure and technique.

## 2024-01-18 NOTE — Patient Instructions (Signed)
 ______________________________________________________________________    Post-Procedure Discharge Instructions  INSTRUCTIONS Apply ice:  Purpose: This will minimize any swelling and discomfort after procedure.  When: Day of procedure, as soon as you get home. How: Fill a plastic sandwich bag with crushed ice. Cover it with a small towel and apply to injection site. How long: (15 min on, 15 min off) Apply for 15 minutes then remove x 15 minutes.  Repeat sequence on day of procedure, until you go to bed. Apply heat:  Purpose: To treat any soreness and discomfort from the procedure. When: Starting the next day after the procedure. How: Apply heat to procedure site starting the day following the procedure. How long: May continue to repeat daily, until discomfort goes away. Food intake: Start with clear liquids (like water) and advance to regular food, as tolerated.  Physical activities: Keep activities to a minimum for the first 8 hours after the procedure. After that, then as tolerated. Driving: If you have received any sedation, be responsible and do not drive. You are not allowed to drive for 24 hours after having sedation. Blood thinner: (Applies only to those taking blood thinners) You may restart your blood thinner 6 hours after your procedure. Insulin : (Applies only to Diabetic patients taking insulin ) As soon as you can eat, you may resume your normal dosing schedule. Infection prevention: Keep procedure site clean and dry. Shower daily and clean area with soap and water.  PAIN DIARY Post-procedure Pain Diary: Extremely important that this be done correctly and accurately. Recorded information will be used to determine the next step in treatment. For the purpose of accuracy, follow these rules: Evaluate only the area treated. Do not report or include pain from an untreated area. For the purpose of this evaluation, ignore all other areas of pain, except for the treated area. After your  procedure, avoid taking a long nap and attempting to complete the pain diary after you wake up. Instead, set your alarm clock to go off every hour, on the hour, for the initial 8 hours after the procedure. Document the duration of the numbing medicine, and the relief you are getting from it. Do not go to sleep and attempt to complete it later. It will not be accurate. If you received sedation, it is likely that you were given a medication that may cause amnesia. Because of this, completing the diary at a later time may cause the information to be inaccurate. This information is needed to plan your care. Follow-up appointment: Keep your post-procedure follow-up evaluation appointment after the procedure (usually 2 weeks for most procedures, 6 weeks for radiofrequencies). DO NOT FORGET to bring you pain diary with you.   EXPECT... (What should I expect to see with my procedure?) From numbing medicine (AKA: Local Anesthetics): Numbness or decrease in pain. You may also experience some weakness, which if present, could last for the duration of the local anesthetic. Onset: Full effect within 15 minutes of injected. Duration: It will depend on the type of local anesthetic used. On the average, 1 to 8 hours.  From steroids (NO STEROIDS GIVEN) From procedure: Some discomfort is to be expected once the numbing medicine wears off. This should be minimal if ice and heat are applied as instructed.  CALL IF... (When should I call?) You experience numbness and weakness that gets worse with time, as opposed to wearing off. New onset bowel or bladder incontinence. (Applies only to procedures done in the spine)  Emergency Numbers: Durning business hours (Monday -  Thursday, 8:00 AM - 4:00 PM) (Friday, 9:00 AM - 12:00 Noon): [phone] After hours: [phone] NOTE: If you are having a problem and are unable connect with, or to talk to a provider, then go to your nearest urgent care or emergency department. If  the problem is serious and urgent, please call 911. ______________________________________________________________________

## 2024-01-19 ENCOUNTER — Telehealth: Payer: Self-pay | Admitting: *Deleted

## 2024-01-19 NOTE — Telephone Encounter (Signed)
 Post procedure call; reports she is doing alright just a little achy in her back. Better this morning.

## 2024-02-05 NOTE — Progress Notes (Unsigned)
 PROVIDER NOTE: Interpretation of information contained herein should be left to medically-trained personnel. Specific patient instructions are provided elsewhere under Patient Instructions section of medical record. This document was created in part using AI and STT-dictation technology, any transcriptional errors that may result from this process are unintentional.  Patient: Kristy Cunningham  Service: E/M   PCP: Era Raisin, NP  DOB: Aug 16, 1970  DOS: 02/07/2024  Provider: Eric DELENA Como, MD  MRN: [id number]  Delivery: Face-to-face  Specialty: Interventional Pain Management  Type: Established Patient  Setting: Ambulatory outpatient facility  Specialty designation: 09  Referring Prov.: Era Raisin, NP  Location: Outpatient office facility       History of present illness (HPI) Kristy Cunningham, a 53 y.o. year old female, is here today because of her Chronic bilateral low back pain without sciatica [M54.50, G89.29]. [name]'s primary complain today is No chief complaint on file.  Pertinent problems: [name] has Spondylolisthesis, lumbar region; Lumbar radiculopathy (Left); Arthropathy of lumbar facet joint (Bilateral) (L3-4, L4-5); Chronic LLQ pain; Lumbar spondylosis; Chronic sacroiliac joint pain (Left); Swelling of lower leg; Chronic pain syndrome; Abnormal MRI, lumbar spine (11/30/2022) ([facility]); Chronic feet pain (Bilateral); Chronic knee pain (3ry area of Pain) (Left); Spasm of muscle of lower back; Muscle spasms of lower extremity (Left); Fasciculations of muscle (hamstring) (Left); Allodynia (foot) (Left); Chronic feet swelling (Bilateral); Lumbar intervertebral disc protrusion (Right: L2-3) (Left: L3-4); Displacement of lumbar intervertebral disc; Chronic lower extremity pain (1ry area of Pain) (Left); Chronic low back pain (Midline) (4th area of Pain) w/ sciatica (Left); Edema of lower extremities (Bilateral); Chronic painful diabetic neuropathy (HCC); Diabetic  peripheral neuropathy (HCC); Trochanteric bursitis of hips (Bilateral); Burning pain of the feet (Bilateral); Chronic low back pain (Midline) w/o sciatica; Chronic low back pain (Bilateral) w/o sciatica; Lumbar facet joint pain; Diabetic sensorimotor polyneuropathy (HCC); Lumbar facet arthropathy; and Spondylosis without myelopathy or radiculopathy, lumbar region on their pertinent problem list.  Pain Assessment: Severity of   is reported as a  /10. Location:    / . Onset:  . Quality:  . Timing:  . Modifying factor(s):  SABRA Vitals:  vitals were not taken for this visit.  BMI: Estimated body mass index is 31.64 kg/m as calculated from the following:   Height as of 01/18/24: 5' (1.524 m).   Weight as of 01/18/24: 162 lb (73.5 kg).  Last encounter: 12/27/2023. Last procedure: 01/18/2024.  Reason for encounter: post-procedure evaluation and assessment.   Discussed the use of AI scribe software for clinical note transcription with the patient, who gave verbal consent to proceed.  History of Present Illness          Post-Procedure Evaluation   Type: Lumbar Facet, Medial Branch Block(s) (w/ fluoroscopic mapping) #1 (NO STEROIDS) Laterality: Bilateral  Level: L2, L3, L4, L5, and S1 Medial Branch/Dorsal Rami Level(s). Injecting these levels blocks the L3-4, L4-5, and L5-S1 lumbar facet joints. Imaging: Fluoroscopic guidance Spinal ([id number]) Anesthesia: Local anesthesia (1-2% Lidocaine ) Anxiolysis: IV Versed  2.0 mg Sedation: Minimal Sedation Fentanyl  1 mL (50 mcg) DOS: 01/18/2024 Performed by: Eric DELENA Como, MD  Primary Purpose: Diagnostic Indications: Low back pain severe enough to impact quality of life or function. 1. Chronic low back pain (Bilateral) w/o sciatica   2. Lumbar facet joint pain   3. Lumbar facet arthropathy   4. Arthropathy of lumbar facet joint (Bilateral) (L3-4, L4-5)   5. Spondylosis without myelopathy or radiculopathy, lumbar region   6. Diabetes mellitus, type  II, insulin  dependent (  HCC)   7. Uncontrolled type 2 diabetes mellitus with hyperglycemia (HCC)   8. Chronic low back pain (Midline) w/o sciatica    NAS-11 Pain score:   Pre-procedure: 6 /10   Post-procedure: 0-No pain/10     Effectiveness:  Initial hour after procedure:   ***. Subsequent 4-6 hours post-procedure:   ***. Analgesia past initial 6 hours:   ***. Ongoing improvement:  Analgesic:  *** Function:    ***    ROM:    ***     Pharmacotherapy Assessment   No chronic opioid analgesics therapy prescribed by our practice. None MME/day: 0 mg/day   Monitoring: [place] PMP: PDMP reviewed during this encounter.       Pharmacotherapy: No side-effects or adverse reactions reported. Compliance: No problems identified. Effectiveness: Clinically acceptable.  No notes on file  UDS:  Summary  Date Value Ref Range Status  02/08/2023 Note  Final    Comment:    ==================================================================== Compliance Drug Analysis, Ur ==================================================================== Specimen Alert Not Detected result may be consistent with the time of last use noted for this medication. AS NEEDED (Oxycodone , Tramadol) ==================================================================== Test                             Result       Flag       Units  Drug Present and Declared for Prescription Verification   Desmethylcyclobenzaprine       PRESENT      EXPECTED    Desmethylcyclobenzaprine is an expected metabolite of    cyclobenzaprine.    Sertraline                     PRESENT      EXPECTED   Desmethylsertraline            PRESENT      EXPECTED    Desmethylsertraline is an expected metabolite of sertraline.  Drug Absent but Declared for Prescription Verification   Oxycodone                       Not Detected UNEXPECTED ng/mg creat   Tramadol                       Not Detected UNEXPECTED ng/mg creat   Gabapentin                     Not  Detected UNEXPECTED   Tizanidine                     Not Detected UNEXPECTED    Tizanidine, as indicated in the declared medication list, is not    always detected even when used as directed.    Acetaminophen                   Not Detected UNEXPECTED    Acetaminophen , as indicated in the declared medication list, is not    always detected even when used as directed.    Diclofenac                     Not Detected UNEXPECTED    Diclofenac, as indicated in the declared medication list, is not    always detected even when used as directed.    Naproxen   Not Detected UNEXPECTED ==================================================================== Test                      Result    Flag   Units      Ref Range   Creatinine              43               mg/dL      >=79 ==================================================================== Declared Medications:  The flagging and interpretation on this report are based on the  following declared medications.  Unexpected results may arise from  inaccuracies in the declared medications.   **Note: The testing scope of this panel includes these medications:   Cyclobenzaprine (Flexeril)  Gabapentin (Neurontin)  Naproxen  (Naprosyn )  Oxycodone   Sertraline (Zoloft)  Tramadol (Ultram)   **Note: The testing scope of this panel does not include small to  moderate amounts of these reported medications:   Acetaminophen   Diclofenac (Voltaren)  Tizanidine (Zanaflex)   **Note: The testing scope of this panel does not include the  following reported medications:   Azithromycin  (Zithromax )  Bisacodyl  Cephalexin  Clotrimazole (Lotrimin)  Dicyclomine (Bentyl)  Docusate (Colace)  Dulaglutide (Trulicity)  Empagliflozin  (Jardiance )  Fluconazole (Diflucan)  Fluticasone (Flonase)  Hydrochlorothiazide (Hyzaar)  Insulin  (Lantus)  Losartan  (Cozaar )  Losartan  (Hyzaar)  Meloxicam (Mobic)  Metformin (Janumet)  Omeprazole  (Prilosec)  Ondansetron  (Zofran )  Polyethylene Glycol  Prednisone  (Deltasone )  Sitagliptin (Janumet)  Sulfamethoxazole (Bactrim)  Supplement  Topical  Trimethoprim (Bactrim) ==================================================================== For clinical consultation, please call [phone]. ====================================================================     No results found for: CBDTHCR No results found for: D8THCCBX No results found for: D9THCCBX  ROS  Constitutional: Denies any fever or chills Gastrointestinal: No reported hemesis, hematochezia, vomiting, or acute GI distress Musculoskeletal: Denies any acute onset joint swelling, redness, loss of ROM, or weakness Neurological: No reported episodes of acute onset apraxia, aphasia, dysarthria, agnosia, amnesia, paralysis, loss of coordination, or loss of consciousness  Medication Review  DULoxetine, Glucose Blood, Insulin  Pen Needle, aspirin EC, buPROPion, cetirizine, dapagliflozin  propanediol, docusate sodium, fluticasone, furosemide , glucose blood, insulin  glargine, metoprolol  succinate, omeprazole, sacubitril -valsartan , spironolactone , tiZANidine, and tirzepatide  History Review  Allergy: Ms. Auman is allergic to keflex [cephalexin] and jardiance  [empagliflozin ]. Drug: Ms. Reitter  reports no history of drug use. Alcohol:  reports no history of alcohol use. Tobacco:  reports that she has never smoked. She has never used smokeless tobacco. Social: Ms. Meaders  reports that she has never smoked. She has never used smokeless tobacco. She reports that she does not drink alcohol and does not use drugs. Medical:  has a past medical history of Depression, Diabetes mellitus without complication (HCC), Hypertension, Low back strain (10/26/2022), and Pedal edema. Surgical: Ms. Boyko  has a past surgical history that includes RIGHT/LEFT HEART CATH AND CORONARY ANGIOGRAPHY (Bilateral, 08/23/2023). Family: family  history includes Breast cancer in her paternal grandmother.  Laboratory Chemistry Profile   Renal Lab Results  Component Value Date   BUN 33 (H) 09/10/2023   CREATININE 0.83 09/10/2023   BCR 31 (H) 08/02/2023   GFRNONAA >60 09/10/2023    Hepatic Lab Results  Component Value Date   AST 19 09/10/2023   ALT 23 09/10/2023   ALBUMIN 3.9 09/10/2023   ALKPHOS 96 09/10/2023   LIPASE 31 09/10/2023    Electrolytes Lab Results  Component Value Date   NA 133 (L) 09/10/2023   K 4.8 09/10/2023   CL 93 (L) 09/10/2023   CALCIUM  9.9 09/10/2023   MG 2.1 02/08/2023    Bone Lab Results  Component Value Date   25OHVITD1 32 02/08/2023   25OHVITD2 <1.0 02/08/2023   25OHVITD3 32 02/08/2023    Inflammation (CRP: Acute Phase) (ESR: Chronic Phase) Lab Results  Component Value Date   CRP 2 02/08/2023   ESRSEDRATE 68 (H) 02/08/2023   LATICACIDVEN 1.8 02/27/2021         Note: Above Lab results reviewed.  Recent Imaging Review  DG PAIN CLINIC C-ARM 1-60 MIN NO REPORT Fluoro was used, but no Radiologist interpretation will be provided.  Please refer to NOTES tab for provider progress note. Note: Reviewed        Physical Exam  Vitals: LMP 05/11/2016 (Approximate)  BMI: Estimated body mass index is 31.64 kg/m as calculated from the following:   Height as of 01/18/24: 5' (1.524 m).   Weight as of 01/18/24: 162 lb (73.5 kg). Ideal: Patient weight not recorded General appearance: Well nourished, well developed, and well hydrated. In no apparent acute distress Mental status: Alert, oriented x 3 (person, place, & time)       Respiratory: No evidence of acute respiratory distress Eyes: PERLA   Assessment   Diagnosis Status  1. Chronic low back pain (Bilateral) w/o sciatica   2. Lumbar facet joint pain   3. Chronic low back pain (Midline) (4th area of Pain) w/ sciatica (Left)   4. Chronic low back pain (Midline) w/o sciatica   5. Lumbar radiculopathy (Left)   6. Postop check     Controlled Controlled Controlled   Updated Problems: No problems updated.  Plan of Care  Problem-specific:  Assessment and Plan            Ms. SAMINA WEEKES has a current medication list which includes the following long-term medication(s): bupropion, cetirizine, duloxetine, fluticasone, furosemide , lantus solostar, metoprolol  succinate, omeprazole, and spironolactone .  Pharmacotherapy (Medications Ordered): No orders of the defined types were placed in this encounter.  Orders:  No orders of the defined types were placed in this encounter.    Interventional Therapies  Risk Factors  Considerations  Medical Comorbidities:  Uncontrolled T2IDDM  CHF  Hx. Fluid Overload     Planned  Pending:   Diagnostic bilateral lumbar facet MBB #1  Therapeutic bilateral lower extremity Qutenza treatment #1    Under consideration:   Diagnostic bilateral lumbar facet MBB #1 with possible follow-up radiofrequency ablation Therapeutic bilateral lower extremity Qutenza treatment #1    Completed:   Diagnostic/therapeutic left-sided L3-4 LESI x3 (11/28/2023) (100/100/50/50)    Therapeutic  Palliative (PRN) options:   None established   Completed by other providers:   (01/18/2023) diagnostic left sacroiliac joint injection by [facility] (12/14/2022) lower extremity EMG by [facility]     No follow-ups on file.    Recent Visits Date Type Provider Dept  01/18/24 Procedure visit Tanya Glisson, MD [facility]  12/27/23 Office Visit Tanya Glisson, MD [facility]  11/28/23 Procedure visit Tanya Glisson, MD [facility]  Showing recent visits within past 90 days and meeting all other requirements Future Appointments Date Type Provider Dept  02/07/24 Appointment Tanya Glisson, MD [facility]  Showing future appointments within next 90 days and meeting all other requirements  I discussed the assessment and treatment plan  with the patient. The patient was provided an opportunity to ask questions and all were answered. The patient agreed with the plan and demonstrated an understanding of the instructions.  Patient advised to  call back or seek an in-person evaluation if the symptoms or condition worsens.  Duration of encounter: *** minutes.  Total time on encounter, as per AMA guidelines included both the face-to-face and non-face-to-face time personally spent by the physician and/or other qualified health care professional(s) on the day of the encounter (includes time in activities that require the physician or other qualified health care professional and does not include time in activities normally performed by clinical staff). Physician's time may include the following activities when performed: Preparing to see the patient (e.g., pre-charting review of records, searching for previously ordered imaging, lab work, and nerve conduction tests) Review of prior analgesic pharmacotherapies. Reviewing PMP Interpreting ordered tests (e.g., lab work, imaging, nerve conduction tests) Performing post-procedure evaluations, including interpretation of diagnostic procedures Obtaining and/or reviewing separately obtained history Performing a medically appropriate examination and/or evaluation Counseling and educating the patient/family/caregiver Ordering medications, tests, or procedures Referring and communicating with other health care professionals (when not separately reported) Documenting clinical information in the electronic or other health record Independently interpreting results (not separately reported) and communicating results to the patient/ family/caregiver Care coordination (not separately reported)  Note by: Eric DELENA Como, MD (TTS and AI technology used. I apologize for any typographical errors that were not detected and corrected.) Date: 02/07/2024; Time: 6:23 AM

## 2024-02-07 ENCOUNTER — Ambulatory Visit (HOSPITAL_BASED_OUTPATIENT_CLINIC_OR_DEPARTMENT_OTHER): Admitting: Pain Medicine

## 2024-02-07 DIAGNOSIS — G8929 Other chronic pain: Secondary | ICD-10-CM

## 2024-02-07 DIAGNOSIS — M545 Low back pain, unspecified: Secondary | ICD-10-CM

## 2024-02-07 DIAGNOSIS — M5416 Radiculopathy, lumbar region: Secondary | ICD-10-CM

## 2024-02-07 DIAGNOSIS — Z91199 Patient's noncompliance with other medical treatment and regimen due to unspecified reason: Secondary | ICD-10-CM

## 2024-02-07 DIAGNOSIS — M5459 Other low back pain: Secondary | ICD-10-CM

## 2024-02-07 DIAGNOSIS — Z09 Encounter for follow-up examination after completed treatment for conditions other than malignant neoplasm: Secondary | ICD-10-CM

## 2024-02-21 ENCOUNTER — Ambulatory Visit
Admission: RE | Admit: 2024-02-21 | Discharge: 2024-02-21 | Disposition: A | Discharge status: Home / Self Care | Source: Ambulatory Visit | Attending: Pain Medicine | Admitting: Pain Medicine

## 2024-02-21 ENCOUNTER — Ambulatory Visit: Attending: Pain Medicine | Admitting: Pain Medicine

## 2024-02-21 ENCOUNTER — Encounter: Payer: Self-pay | Admitting: Pain Medicine

## 2024-02-21 VITALS — BP 162/104 | HR 106 | Temp 98.1°F | Resp 16 | Ht 60.0 in | Wt 157.0 lb

## 2024-02-21 DIAGNOSIS — M25552 Pain in left hip: Secondary | ICD-10-CM | POA: Insufficient documentation

## 2024-02-21 DIAGNOSIS — E1142 Type 2 diabetes mellitus with diabetic polyneuropathy: Secondary | ICD-10-CM | POA: Insufficient documentation

## 2024-02-21 DIAGNOSIS — M1712 Unilateral primary osteoarthritis, left knee: Secondary | ICD-10-CM | POA: Insufficient documentation

## 2024-02-21 DIAGNOSIS — G8929 Other chronic pain: Secondary | ICD-10-CM

## 2024-02-21 DIAGNOSIS — R52 Pain, unspecified: Secondary | ICD-10-CM | POA: Insufficient documentation

## 2024-02-21 DIAGNOSIS — M62830 Muscle spasm of back: Secondary | ICD-10-CM | POA: Insufficient documentation

## 2024-02-21 DIAGNOSIS — M545 Low back pain, unspecified: Secondary | ICD-10-CM | POA: Insufficient documentation

## 2024-02-21 DIAGNOSIS — M5459 Other low back pain: Secondary | ICD-10-CM | POA: Insufficient documentation

## 2024-02-21 DIAGNOSIS — M2352 Chronic instability of knee, left knee: Secondary | ICD-10-CM | POA: Insufficient documentation

## 2024-02-21 DIAGNOSIS — M25562 Pain in left knee: Secondary | ICD-10-CM | POA: Insufficient documentation

## 2024-02-21 DIAGNOSIS — M79672 Pain in left foot: Secondary | ICD-10-CM | POA: Insufficient documentation

## 2024-02-21 DIAGNOSIS — M79671 Pain in right foot: Secondary | ICD-10-CM | POA: Insufficient documentation

## 2024-02-21 DIAGNOSIS — M25551 Pain in right hip: Secondary | ICD-10-CM | POA: Insufficient documentation

## 2024-02-21 DIAGNOSIS — E1140 Type 2 diabetes mellitus with diabetic neuropathy, unspecified: Secondary | ICD-10-CM | POA: Insufficient documentation

## 2024-02-21 DIAGNOSIS — M47816 Spondylosis without myelopathy or radiculopathy, lumbar region: Secondary | ICD-10-CM | POA: Insufficient documentation

## 2024-02-21 DIAGNOSIS — R937 Abnormal findings on diagnostic imaging of other parts of musculoskeletal system: Secondary | ICD-10-CM | POA: Insufficient documentation

## 2024-02-21 NOTE — Progress Notes (Signed)
 Safety precautions to be maintained throughout the outpatient stay will include: orient to surroundings, keep bed in low position, maintain call bell within reach at all times, provide assistance with transfer out of bed and ambulation.

## 2024-02-21 NOTE — Patient Instructions (Addendum)
 ______________________________________________________________________    Patient information sheet: Qutenza  Capsaicin Topical System - intended to be used only by healthcare providers.  Concentration of capsaicin and the patches is 8% compared to maximum over-the-counter cream concentrations of 0.1%.  What is this medication?   CAPSAICIN (cap SAY sin) treats nerve pain. It works by making your skin feel warm or cool, which blocks pain signals going to the brain. This medicine may be used for other purposes; ask your health care provider or pharmacist if you have questions.  COMMON BRAND NAME(S): Qutenza  What should I tell my care team before I take this medication? They need to know if you have any of these conditions: Have had a heart attack or stroke High blood pressure Large areas of burned or damaged skin An unusual or allergic reaction to capsaicin, hot peppers, other medications, foods, dyes, or preservatives Pregnant or trying to get pregnant Breastfeeding  How should I use this medication? This medication is for external use only. It is applied by your care team in a hospital or clinic setting. Talk to your care team about the use of this medication in children. Special care may be needed.  Overdosage: If you think you have taken too much of this medicine contact a poison control center or emergency room at once.  NOTE: This medicine is only for you. Do not share this medicine with others.  What if I miss a dose? This does not apply.  What may interact with this medication? Interactions are not expected. Do not use any other skin products on the affected area without asking your care team. This list may not describe all possible interactions. Give your health care provider a list of all the medicines, herbs, non-prescription drugs, or dietary supplements you use. Also tell them if you smoke, drink alcohol, or use illegal drugs. Some items may interact with your  medicine.  What should I watch for while using this medication? Your condition will be monitored carefully while you are receiving this medication. Tell your care team if your symptoms do not start to get better or if they get worse.  Talk to your care team about how to treat discomfort. You may place a cooling pack from the refrigerator (not the freezer) on the area. Do not place it directly on the skin.  Try not to touch the area where the patch was applied. If you do, wash your hands with soap and water right away. Your skin may be sensitive to heat for a few days after treatment. Avoid hot baths or showers, heating pads, and direct sunlight on the treated area.  What side effects may I notice from receiving this medication? Side effects that you should report to your care team as soon as possible: Allergic reactions--skin rash, itching, hives, swelling of the face, lips, tongue, or throat Burning, itching, crusting, or peeling of treated skin Increase in blood pressure Numbness, decrease in sense of touch or sensation  Side effects that usually do not require medical attention (report these to your care team if they continue or are bothersome): Mild skin irritation, redness, or dryness  This list may not describe all possible side effects. Call your doctor for medical advice about side effects. You may report side effects to FDA at 1-800-FDA-1088.  Where should I keep my medication? This medication is given in a hospital or clinic. It will not be stored at home.  NOTE: This sheet is a summary. It may not cover all possible  information. If you have questions about this medicine, talk to your doctor, pharmacist, or health care provider.   2024 [employer] Standard (2023-02-24 00:00:00)  ______________________________________________________________________      ______________________________________________________________________    Muscle Spasms & Cramps  Cause(s):  Most  common - vitamin and/or electrolyte (calcium, potassium, sodium, etc.) deficiencies. Post procedure - steroids (injected, oral, or inhaled) can make your kidneys excrete (loose) electrolytes. Most of the time this will not cause any symptoms however, if you happen to be borderline low on your electrolytes, it may temporarily triggering cramps & spasms.  Possible triggers: Sweating - causes loss of electrolytes thru the skin. Steroids - causes loss of electrolytes thru the urine.  Treatment: (over-the-counter)  Gatorade (or any other electrolyte-replenishing drink) - Take 1, 8 oz glass with each meal (3 times a day). Mechanism of action: Replenishes lost electrolytes. Magnesium 400 to 500 mg - Take 1 tablet twice a day (one with breakfast and one at bedtime). If you have kidney disease talk to your primary care physician before taking any Magnesium. Mechanism of action: Magnesium is a natural muscle relaxant. Tonic Water with quinine - Take 1, 8 oz glass before bedtime.  Mechanism of action: Quinine is used to treat spasms.  Last Update: 10/06/2022  ______________________________________________________________________

## 2024-02-21 NOTE — Progress Notes (Addendum)
 PROVIDER NOTE: Interpretation of information contained herein should be left to medically-trained personnel. Specific patient instructions are provided elsewhere under Patient Instructions section of medical record. This document was created in part using AI and STT-dictation technology, any transcriptional errors that may result from this process are unintentional.  Patient: Carole MARLA Mainland  Service: E/M   PCP: Era Raisin, NP  DOB: 1970/05/03  DOS: 02/21/2024  Provider: Eric DELENA Como, MD  MRN: [id number]  Delivery: Face-to-face  Specialty: Interventional Pain Management  Type: Established Patient  Setting: Ambulatory outpatient facility  Specialty designation: 09  Referring Prov.: Era Raisin, NP  Location: Outpatient office facility       History of present illness (HPI) Ms. JAXSYN CATALFAMO, a 53 y.o. year old female, is here today because of her Chronic bilateral low back pain without sciatica [M54.50, G89.29]. Ms. Mckay's primary complain today is Foot Pain (Bilateral ) and Rectal Pain (Left )  Pertinent problems: Ms. Urbach has Spondylolisthesis, lumbar region; Lumbar radiculopathy (Left); Arthropathy of lumbar facet joint (Bilateral) (L3-4, L4-5); Chronic LLQ pain; Lumbar spondylosis; Chronic sacroiliac joint pain (Left); Swelling of lower leg; Chronic pain syndrome; Abnormal MRI, lumbar spine (11/30/2022) ([facility]); Chronic feet pain (Bilateral); Chronic knee pain (3ry area of Pain) (Left); Spasm of muscle of lower back; Muscle spasms of lower extremity (Left); Fasciculations of muscle (hamstring) (Left); Allodynia (foot) (Left); Chronic feet swelling (Bilateral); Lumbar intervertebral disc protrusion (Right: L2-3) (Left: L3-4); Displacement of lumbar intervertebral disc; Chronic lower extremity pain (1ry area of Pain) (Left); Chronic low back pain (Midline) (4th area of Pain) w/ sciatica (Left); Edema of lower extremities (Bilateral); Chronic painful diabetic neuropathy  (HCC); Diabetic peripheral neuropathy (HCC); Trochanteric bursitis of hips (Bilateral); Burning pain of the feet (Bilateral); Chronic low back pain (Midline) w/o sciatica; Chronic low back pain (Bilateral) w/o sciatica; Lumbar facet joint pain; Diabetic sensorimotor polyneuropathy (HCC); Lumbar facet arthropathy; Spondylosis without myelopathy or radiculopathy, lumbar region; Recurrent left knee instability; Primary osteoarthritis of left knee; and Chronic hip pain (Bilateral) (L>R) on their pertinent problem list.  Pain Assessment: Severity of Chronic pain is reported as a 5 /10. Location: Foot (left buttocks and left knee) Right, Left/buttocks pain on left radiates down the left leg to the back of the knee and around to the shin and to the foot. Onset: More than a month ago. Quality: Constant, Aching, Spasm, Other (Comment), Sore, Tender. Timing: Intermittent. Modifying factor(s): knee brace, procedures, medications. Vitals:  height is 5' (1.524 m) and weight is 157 lb (71.2 kg). Her temporal temperature is 98.1 F (36.7 C). Her blood pressure is 162/104 (abnormal) and her pulse is 106 (abnormal). Her respiration is 16 and oxygen saturation is 98%.  BMI: Estimated body mass index is 30.66 kg/m as calculated from the following:   Height as of this encounter: 5' (1.524 m).   Weight as of this encounter: 157 lb (71.2 kg).  Last encounter: 02/07/2024. Last procedure: 01/18/2024.  Reason for encounter: post-procedure evaluation and assessment.   Discussed the use of AI scribe software for clinical note transcription with the patient, who gave verbal consent to proceed.  History of Present Illness   Shastina Rua is a 53 year old female with diabetic neuropathy who presents for follow-up after a lumbar facet medial branch block.  She underwent a diagnostic bilateral lumbar facet medial branch block without steroids on January 18, 2024. She had complete relief while the local anesthetic was  active and good relief for about 24 hours after.  Her back pain gradually returned but had resolved by day 15, with ongoing relief despite no steroids.  She has burning pain in both feet with allodynia that prevents her from covering them. Symptoms were previously severe but have lessened and are now milder. She was told she has diabetic neuropathy and prior nerve conduction testing showed about 40-50% reduced function. She denies shooting pain down the legs or into the feet.  She has left-predominant leg pain that begins as an ache in the knee and radiates down the leg. She describes a tight, achy pain in the bone or muscle, mainly at the knee, which can feel unstable. She uses a knee brace at work. The pain is worse on the left, with intermittent muscle spasms and toe stiffness.  She has bursitis in both hips with greater pain on the left. She uses ice packs for relief.      Post-Procedure Evaluation   Type: Lumbar Facet, Medial Branch Block(s) (w/ fluoroscopic mapping) #1 (NO STEROIDS) Laterality: Bilateral  Level: L2, L3, L4, L5, and S1 Medial Branch/Dorsal Rami Level(s). Injecting these levels blocks the L3-4, L4-5, and L5-S1 lumbar facet joints. Imaging: Fluoroscopic guidance Spinal ([id number]) Anesthesia: Local anesthesia (1-2% Lidocaine ) Anxiolysis: IV Versed  2.0 mg Sedation: Minimal Sedation Fentanyl  1 mL (50 mcg) DOS: 01/18/2024 Performed by: Eric DELENA Como, MD  Primary Purpose: Diagnostic Indications: Low back pain severe enough to impact quality of life or function. 1. Chronic low back pain (Bilateral) w/o sciatica   2. Lumbar facet joint pain   3. Lumbar facet arthropathy   4. Arthropathy of lumbar facet joint (Bilateral) (L3-4, L4-5)   5. Spondylosis without myelopathy or radiculopathy, lumbar region   6. Diabetes mellitus, type II, insulin  dependent (HCC)   7. Uncontrolled type 2 diabetes mellitus with hyperglycemia (HCC)   8. Chronic low back pain (Midline) w/o  sciatica    NAS-11 Pain score:   Pre-procedure: 6 /10   Post-procedure: 0-No pain/10     Effectiveness:  Initial hour after procedure: 100 %. Subsequent 4-6 hours post-procedure: 100 %. Analgesia past initial 6 hours: 100 % (good pain relief for 24 hours, then pain was gradually returning, on day 15th pain had resolved and she reports cointinued pain relief.). Ongoing improvement:  Analgesic: The patient indicates having attained 100% relief of the pain for the duration of local anesthetic followed by a significant improvement despite the fact that the procedure was not done with steroids. Function: Ms. Goldinger reports improvement in function ROM: Ms. Cockerill reports improvement in ROM Interpretation: The fact that the patient attained 100% relief of the pain for the duration of local anesthetic confirms the etiology of the pain to be that of the facet joints.  Pharmacotherapy Assessment   Opioid Analgesics: No chronic opioid analgesics therapy prescribed by our practice. None MME/day: 0 mg/day   Monitoring: [place] PMP: PDMP reviewed during this encounter.       Pharmacotherapy: No side-effects or adverse reactions reported. Compliance: No problems identified. Effectiveness: Clinically acceptable.  Jakie Chrissie MATSU, RN  02/21/2024  1:35 PM  Signed Safety precautions to be maintained throughout the outpatient stay will include: orient to surroundings, keep bed in low position, maintain call bell within reach at all times, provide assistance with transfer out of bed and ambulation.     UDS:  Summary  Date Value Ref Range Status  02/08/2023 Note  Final    Comment:    ==================================================================== Compliance Drug Analysis, Ur ==================================================================== Specimen Alert Not Detected result may be  consistent with the time of last use noted for this medication. AS NEEDED (Oxycodone ,  Tramadol) ==================================================================== Test                             Result       Flag       Units  Drug Present and Declared for Prescription Verification   Desmethylcyclobenzaprine       PRESENT      EXPECTED    Desmethylcyclobenzaprine is an expected metabolite of    cyclobenzaprine.    Sertraline                     PRESENT      EXPECTED   Desmethylsertraline            PRESENT      EXPECTED    Desmethylsertraline is an expected metabolite of sertraline.  Drug Absent but Declared for Prescription Verification   Oxycodone                       Not Detected UNEXPECTED ng/mg creat   Tramadol                       Not Detected UNEXPECTED ng/mg creat   Gabapentin                     Not Detected UNEXPECTED   Tizanidine                     Not Detected UNEXPECTED    Tizanidine, as indicated in the declared medication list, is not    always detected even when used as directed.    Acetaminophen                   Not Detected UNEXPECTED    Acetaminophen , as indicated in the declared medication list, is not    always detected even when used as directed.    Diclofenac                     Not Detected UNEXPECTED    Diclofenac, as indicated in the declared medication list, is not    always detected even when used as directed.    Naproxen                        Not Detected UNEXPECTED ==================================================================== Test                      Result    Flag   Units      Ref Range   Creatinine              43               mg/dL      >=79 ==================================================================== Declared Medications:  The flagging and interpretation on this report are based on the  following declared medications.  Unexpected results may arise from  inaccuracies in the declared medications.   **Note: The testing scope of this panel includes these medications:   Cyclobenzaprine (Flexeril)  Gabapentin  (Neurontin)  Naproxen  (Naprosyn )  Oxycodone   Sertraline (Zoloft)  Tramadol (Ultram)   **Note: The testing scope of this panel does not include small to  moderate amounts of these reported medications:   Acetaminophen   Diclofenac (Voltaren)  Tizanidine (Zanaflex)   **Note: The  testing scope of this panel does not include the  following reported medications:   Azithromycin  (Zithromax )  Bisacodyl  Cephalexin  Clotrimazole (Lotrimin)  Dicyclomine (Bentyl)  Docusate (Colace)  Dulaglutide (Trulicity)  Empagliflozin  (Jardiance )  Fluconazole (Diflucan)  Fluticasone (Flonase)  Hydrochlorothiazide (Hyzaar)  Insulin  (Lantus)  Losartan  (Cozaar )  Losartan  (Hyzaar)  Meloxicam (Mobic)  Metformin (Janumet)  Omeprazole (Prilosec)  Ondansetron  (Zofran )  Polyethylene Glycol  Prednisone  (Deltasone )  Sitagliptin (Janumet)  Sulfamethoxazole (Bactrim)  Supplement  Topical  Trimethoprim (Bactrim) ==================================================================== For clinical consultation, please call [phone]. ====================================================================     No results found for: CBDTHCR No results found for: D8THCCBX No results found for: D9THCCBX  ROS  Constitutional: Denies any fever or chills Gastrointestinal: No reported hemesis, hematochezia, vomiting, or acute GI distress Musculoskeletal: Denies any acute onset joint swelling, redness, loss of ROM, or weakness Neurological: No reported episodes of acute onset apraxia, aphasia, dysarthria, agnosia, amnesia, paralysis, loss of coordination, or loss of consciousness  Medication Review  DULoxetine, Glucose Blood, Insulin  Pen Needle, aspirin EC, buPROPion, cetirizine, dapagliflozin  propanediol, docusate sodium, fluticasone, furosemide , glucose blood, insulin  glargine, metoprolol  succinate, omeprazole, sacubitril -valsartan , spironolactone , tiZANidine, and tirzepatide  History Review   Allergy: Ms. Califano is allergic to keflex [cephalexin] and jardiance  [empagliflozin ]. Drug: Ms. Mian  reports no history of drug use. Alcohol:  reports no history of alcohol use. Tobacco:  reports that she has never smoked. She has never used smokeless tobacco. Social: Ms. Mentel  reports that she has never smoked. She has never used smokeless tobacco. She reports that she does not drink alcohol and does not use drugs. Medical:  has a past medical history of Depression, Diabetes mellitus without complication (HCC), Hypertension, Low back strain (10/26/2022), and Pedal edema. Surgical: Ms. Hochberg  has a past surgical history that includes RIGHT/LEFT HEART CATH AND CORONARY ANGIOGRAPHY (Bilateral, 08/23/2023). Family: family history includes Breast cancer in her paternal grandmother.  Laboratory Chemistry Profile   Renal Lab Results  Component Value Date   BUN 33 (H) 09/10/2023   CREATININE 0.83 09/10/2023   BCR 31 (H) 08/02/2023   GFRNONAA >60 09/10/2023    Hepatic Lab Results  Component Value Date   AST 19 09/10/2023   ALT 23 09/10/2023   ALBUMIN 3.9 09/10/2023   ALKPHOS 96 09/10/2023   LIPASE 31 09/10/2023    Electrolytes Lab Results  Component Value Date   NA 133 (L) 09/10/2023   K 4.8 09/10/2023   CL 93 (L) 09/10/2023   CALCIUM 9.9 09/10/2023   MG 2.1 02/08/2023    Bone Lab Results  Component Value Date   25OHVITD1 32 02/08/2023   25OHVITD2 <1.0 02/08/2023   25OHVITD3 32 02/08/2023    Inflammation (CRP: Acute Phase) (ESR: Chronic Phase) Lab Results  Component Value Date   CRP 2 02/08/2023   ESRSEDRATE 68 (H) 02/08/2023   LATICACIDVEN 1.8 02/27/2021         Note: Above Lab results reviewed.  Recent Imaging Review  DG PAIN CLINIC C-ARM 1-60 MIN NO REPORT Fluoro was used, but no Radiologist interpretation will be provided.  Please refer to NOTES tab for provider progress note. Note: Reviewed        Physical Exam  Vitals: BP (!) 162/104 (BP  Location: Left Arm, Patient Position: Sitting, Cuff Size: Normal)   Pulse (!) 106   Temp 98.1 F (36.7 C) (Temporal)   Resp 16   Ht 5' (1.524 m)   Wt 157 lb (71.2 kg)   LMP 05/11/2016 (Approximate)   SpO2  98%   BMI 30.66 kg/m  BMI: Estimated body mass index is 30.66 kg/m as calculated from the following:   Height as of this encounter: 5' (1.524 m).   Weight as of this encounter: 157 lb (71.2 kg). Ideal: Ideal body weight: 45.5 kg (100 lb 4.9 oz) Adjusted ideal body weight: 55.8 kg (122 lb 15.8 oz) General appearance: Well nourished, well developed, and well hydrated. In no apparent acute distress Mental status: Alert, oriented x 3 (person, place, & time)       Respiratory: No evidence of acute respiratory distress Eyes: PERLA Physical exam: Straight leg raise was negative bilaterally.  Patrick maneuver was positive bilaterally for reproducing some of the pain in the buttocks area and decreased range of motion specially on the left hip.  Assessment   Diagnosis Status  1. Chronic low back pain (Bilateral) w/o sciatica   2. Lumbar facet arthropathy   3. Lumbar facet joint pain   4. Spasm of muscle of lower back   5. Spondylosis without myelopathy or radiculopathy, lumbar region   6. Abnormal MRI, lumbar spine (11/30/2022) ([facility])   7. Chronic feet pain (Bilateral)   8. Burning pain of the feet (Bilateral)   9. Diabetic peripheral neuropathy (HCC)   10. Diabetic sensorimotor polyneuropathy (HCC)   11. Chronic painful diabetic neuropathy (HCC)   12. Chronic knee pain (3ry area of Pain) (Left)   13. Recurrent left knee instability   14. Primary osteoarthritis of left knee   15. Chronic hip pain (Bilateral) (L>R)    Improved Persistent Persistent   Updated Problems: Problem  Recurrent Left Knee Instability  Primary Osteoarthritis of Left Knee  Chronic hip pain (Bilateral) (L>R)    Plan of Care  Problem-specific:  Assessment and Plan    Lumbar facet joint pain  due to spondylosis   Lumbar facet joint pain due to spondylosis is confirmed. She experienced 100% pain relief during the diagnostic bilateral lumbar facet medial branch block without steroids, but pain returned after 15 days and has since resolved. Steroids were avoided due to diabetes, limiting long-term benefit. She is an excellent candidate for radiofrequency ablation, which can provide relief for 3 months to 1.5 years. Insurance requires a second block and physical therapy for radiofrequency approval. A second diagnostic bilateral lumbar facet medial branch block will be performed, and physical therapy for the lower back will be ordered. Radiofrequency ablation will proceed if the second block is successful.  Type 2 diabetes mellitus with diabetic polyneuropathy   Diabetic polyneuropathy is confirmed with nerve conduction studies showing a 40-50% reduction in nerve function. Symptoms include a burning sensation in the feet and sensitivity to touch. Diabetes control is crucial to slow progression. Qtenza treatment with 8% capsaicin patches is planned to alleviate the burning sensation. These patches require in-office application due to their formulation. The importance of diabetes control to slow neuropathy progression was emphasized.  Pain in left knee and left leg, under evaluation   She reports pain in the left knee and leg, described as aching and sometimes giving out, more towards the inside of the knee, and associated with bone popping in the hip. The differential includes arthritis and referred pain, with previous bursitis noted. No recent imaging is available. X-rays of the knee and hip will be ordered if not available, and she will continue using a knee brace.  Muscle spasm and cramping   She experiences muscle spasms and cramping, particularly in the toes, with associated stiffness. Symptoms may be stress-related.  Information on over-the-counter treatments for muscle spasms and cramping was  provided.       Ms. ELHAM FINI has a current medication list which includes the following long-term medication(s): bupropion, cetirizine, duloxetine, fluticasone, furosemide , lantus solostar, metoprolol  succinate, omeprazole, and spironolactone .  Pharmacotherapy (Medications Ordered): No orders of the defined types were placed in this encounter.  Orders:  Orders Placed This Encounter  Procedures   NEUROLYSIS    Please order Qutenza patches    Standing Status:   Future    Expiration Date:   05/23/2024    Where will this procedure be performed?:   [facility]   MR KNEE LEFT WO CONTRAST    Standing Status:   Future    Expiration Date:   05/23/2024    Scheduling Instructions:     Please make sure that the patient understands that this needs to be done as soon as possible. Never have the patient do the imaging just before the next appointment. Inform patient that having the imaging done within the [facility] will expedite the availability of the results and will provide      imaging availability to the requesting physician. In addition inform the patient that the imaging order has an expiration date and will not be renewed if not done within the active period.    What is the patient's sedation requirement?:   No Sedation    Does the patient have a pacemaker or implanted devices?:   No    Preferred imaging location?:   [facility] (table limit-350lbs)    Call Results- Best Contact Number?:   [phone] [place] [facility]    Radiology Contrast Protocol - do NOT remove file path:   [email or web address]   DG HIPS BILAT W OR W/O PELVIS MIN 5 VIEWS    If at all possible get AP image of hip joint on standing position (weight bearing) to evaluate intra-articular joint space. Please provide detail reporting of chronic degenerative changes (osteophytosis, decreased joint space) and any other changes  associated with osteoarthropathy, or enthesopathies.    Standing Status:   Future    Expiration Date:   05/23/2024    Scheduling Instructions:     Please make sure that the patient understands that this needs to be done as soon as possible. Never have the patient do the imaging just before the next appointment. Inform patient that having the imaging done within the [facility] will expedite the availability of the results and will provide      imaging availability to the requesting physician. In addition inform the patient that the imaging order has an expiration date and will not be renewed if not done within the active period.    Reason for Exam (SYMPTOM  OR DIAGNOSIS REQUIRED):   Chronic bilateral hip pain (M25.551, M25.552, G89.29)    Is patient pregnant?:   No    Preferred imaging location?:   [facility]    Release to patient:   Immediate    Call Results- Best Contact Number?:   [phone] [place] [facility]   Ambulatory referral to Physical Therapy    Referral Priority:   Routine    Referral Type:   Physical Medicine    Referral Reason:   Specialty Services Required    Requested Specialty:   Physical Therapy    Number of Visits Requested:   1     Interventional Therapies  Risk Factors  Considerations  Medical Comorbidities:  Uncontrolled T2IDDM  CHF  Hx. Fluid Overload     Planned  Pending:   Therapeutic bilateral lower extremity Qutenza treatment #1    Under consideration:   Therapeutic bilateral lower extremity Qutenza treatment #1  Diagnostic bilateral lumbar facet MBB #2 (w/o steroids) with possible follow-up RFA   Completed:   Diagnostic bilateral lumbar facet MBB x1 (w/o steroids) (01/18/2024) (100/100/100/0) Diagnostic/therapeutic left-sided L3-4 LESI x3 (11/28/2023) (100/100/50/50)    Therapeutic  Palliative (PRN) options:   None established   Completed by other providers:   (01/18/2023) diagnostic  left sacroiliac joint injection by [facility] (12/14/2022) lower extremity EMG by [facility]     Return for (Clinic):(B) LE Qutenza TX #1 .    Recent Visits Date Type Provider Dept  02/07/24 Office Visit Tanya Glisson, MD [facility]  01/18/24 Procedure visit Tanya Glisson, MD [facility]  12/27/23 Office Visit Tanya Glisson, MD [facility]  11/28/23 Procedure visit Tanya Glisson, MD [facility]  Showing recent visits within past 90 days and meeting all other requirements Today's Visits Date Type Provider Dept  02/21/24 Office Visit Tanya Glisson, MD [facility]  Showing today's visits and meeting all other requirements Future Appointments No visits were found meeting these conditions. Showing future appointments within next 90 days and meeting all other requirements  I discussed the assessment and treatment plan with the patient. The patient was provided an opportunity to ask questions and all were answered. The patient agreed with the plan and demonstrated an understanding of the instructions.  Patient advised to call back or seek an in-person evaluation if the symptoms or condition worsens.  Duration of encounter: 35 minutes.  Total time on encounter, as per AMA guidelines included both the face-to-face and non-face-to-face time personally spent by the physician and/or other qualified health care professional(s) on the day of the encounter (includes time in activities that require the physician or other qualified health care professional and does not include time in activities normally performed by clinical staff). Physician's time may include the following activities when performed: Preparing to see the patient (e.g., pre-charting review of records, searching for previously ordered imaging, lab work, and nerve conduction tests) Review of prior analgesic pharmacotherapies. Reviewing PMP Interpreting ordered  tests (e.g., lab work, imaging, nerve conduction tests) Performing post-procedure evaluations, including interpretation of diagnostic procedures Obtaining and/or reviewing separately obtained history Performing a medically appropriate examination and/or evaluation Counseling and educating the patient/family/caregiver Ordering medications, tests, or procedures Referring and communicating with other health care professionals (when not separately reported) Documenting clinical information in the electronic or other health record Independently interpreting results (not separately reported) and communicating results to the patient/ family/caregiver Care coordination (not separately reported)  Note by: Glisson DELENA Tanya, MD (TTS and AI technology used. I apologize for any typographical errors that were not detected and corrected.) Date: 02/21/2024; Time: 1:37 PM

## 2024-02-29 ENCOUNTER — Telehealth: Payer: Self-pay | Admitting: Pain Medicine

## 2024-02-29 NOTE — Telephone Encounter (Signed)
 Spoke  with patient.  She is having left leg pain. States it is ache and spasms.  States she takes a bite out of her Tizanidine and it usually helps.  I instructed her to take the whole tablet instead of a bite to see if that will help.  States she is feeling better now but was having terrible pain this morning.  Patient is scheduled for Qutenza and is to get an MRI.  Kristy Cunningham is checking on tha approval for that.  Informed patient to call back and schedule appointment if she felt like she needed to see Dr Tanya.

## 2024-02-29 NOTE — Telephone Encounter (Signed)
 PT stated that she don't know if she needs to go to the er or come here. This is a result of the procedure she had.

## 2024-02-29 NOTE — Telephone Encounter (Signed)
 PT stated that she is in server pain and don't know if she needs to go to the ER or come here. This is the result of a procedure she had pt stated.

## 2024-03-03 ENCOUNTER — Emergency Department
Admission: EM | Admit: 2024-03-03 | Discharge: 2024-03-03 | Disposition: A | Discharge status: Home / Self Care | Attending: Emergency Medicine | Admitting: Emergency Medicine

## 2024-03-03 ENCOUNTER — Other Ambulatory Visit: Payer: Self-pay

## 2024-03-03 DIAGNOSIS — R739 Hyperglycemia, unspecified: Secondary | ICD-10-CM | POA: Insufficient documentation

## 2024-03-03 DIAGNOSIS — M79605 Pain in left leg: Secondary | ICD-10-CM

## 2024-03-03 DIAGNOSIS — M25552 Pain in left hip: Secondary | ICD-10-CM | POA: Insufficient documentation

## 2024-03-03 DIAGNOSIS — M79662 Pain in left lower leg: Secondary | ICD-10-CM | POA: Insufficient documentation

## 2024-03-03 HISTORY — DX: Heart failure, unspecified: I50.9

## 2024-03-03 LAB — CBC WITH DIFFERENTIAL/PLATELET
Abs Immature Granulocytes: 0.02 K/uL (ref 0.00–0.07)
Basophils Absolute: 0 K/uL (ref 0.0–0.1)
Basophils Relative: 1 %
Eosinophils Absolute: 0.1 K/uL (ref 0.0–0.5)
Eosinophils Relative: 2 %
HCT: 43.2 % (ref 36.0–46.0)
Hemoglobin: 14.5 g/dL (ref 12.0–15.0)
Immature Granulocytes: 0 %
Lymphocytes Relative: 29 %
Lymphs Abs: 1.3 K/uL (ref 0.7–4.0)
MCH: 29.2 pg (ref 26.0–34.0)
MCHC: 33.6 g/dL (ref 30.0–36.0)
MCV: 86.9 fL (ref 80.0–100.0)
Monocytes Absolute: 0.2 K/uL (ref 0.1–1.0)
Monocytes Relative: 5 %
Neutro Abs: 2.9 K/uL (ref 1.7–7.7)
Neutrophils Relative %: 63 %
Platelets: 275 K/uL (ref 150–400)
RBC: 4.97 MIL/uL (ref 3.87–5.11)
RDW: 13.2 % (ref 11.5–15.5)
WBC: 4.5 K/uL (ref 4.0–10.5)
nRBC: 0 % (ref 0.0–0.2)

## 2024-03-03 LAB — BASIC METABOLIC PANEL WITH GFR
Anion gap: 13 (ref 5–15)
BUN: 18 mg/dL (ref 6–20)
CO2: 28 mmol/L (ref 22–32)
Calcium: 9.9 mg/dL (ref 8.9–10.3)
Chloride: 92 mmol/L — ABNORMAL LOW (ref 98–111)
Creatinine, Ser: 0.65 mg/dL (ref 0.44–1.00)
GFR, Estimated: >60 mL/min (ref >60)
Glucose, Bld: 418 mg/dL — ABNORMAL HIGH (ref 70–99)
Potassium: 4 mmol/L (ref 3.5–5.1)
Sodium: 132 mmol/L — ABNORMAL LOW (ref 135–145)

## 2024-03-03 LAB — CBG MONITORING, ED: Glucose-Capillary: 306 mg/dL — ABNORMAL HIGH (ref 70–99)

## 2024-03-03 LAB — D-DIMER, QUANTITATIVE: D-Dimer, Quant: 0.31 ug{FEU}/mL (ref 0.00–0.50)

## 2024-03-03 MED ORDER — LIDOCAINE 5 % EX PTCH
1.0000 | MEDICATED_PATCH | Freq: Once | CUTANEOUS | Status: DC
  Administered 2024-03-03: 1 via TRANSDERMAL
  Filled 2024-03-03: qty 1

## 2024-03-03 MED ORDER — OXYCODONE-ACETAMINOPHEN 5-325 MG PO TABS
1.0000 | ORAL_TABLET | Freq: Once | ORAL | Status: AC
  Administered 2024-03-03: 1 via ORAL
  Filled 2024-03-03: qty 1

## 2024-03-03 MED ORDER — INSULIN ASPART 100 UNIT/ML IJ SOLN: 6.0000 [IU] | Freq: Once | INTRAMUSCULAR | Status: DC

## 2024-03-03 MED ORDER — INSULIN ASPART 100 UNIT/ML IJ SOLN
6.0000 [IU] | Freq: Once | INTRAMUSCULAR | Status: AC
  Administered 2024-03-03: 6 [IU] via SUBCUTANEOUS
  Filled 2024-03-03: qty 6

## 2024-03-03 NOTE — ED Provider Notes (Signed)
 [facility] Provider Note    Event Date/Time   First MD Initiated Contact with Patient 03/03/24 1422     (approximate)   History   Hip Pain, Leg Pain, and Knee Pain   HPI  Kristy Cunningham is a 53 y.o. female presents to the emergency department with left-sided leg and hip pain.  Patient states that she has not had any falls or trauma.  Followed by pain clinic for pain to her lower back.  States that she recently received some injections.  Has been taking muscle relaxer.  Ongoing pain to the left leg.  Denies any history of DVT or PE.  No erythema or warmth.  States that she gets muscle spasms.  No weakness, no urinary or bowel incontinence, no saddle anesthesia and no numbness.  Has had an MRI of her back last year but nothing since then.  States that she has had a scheduled MRI of her left knee but has not yet been done.     Physical Exam   Triage Vital Signs: ED Triage Vitals  Encounter Vitals Group     BP 03/03/24 1348 (!) 165/93     Girls Systolic BP Percentile --      Girls Diastolic BP Percentile --      Boys Systolic BP Percentile --      Boys Diastolic BP Percentile --      Pulse Rate 03/03/24 1348 (!) 118     Resp 03/03/24 1348 20     Temp 03/03/24 1348 99.2 F (37.3 C)     Temp Source 03/03/24 1348 Oral     SpO2 03/03/24 1348 95 %     Weight 03/03/24 1352 167 lb (75.8 kg)     Height 03/03/24 1352 5' (1.524 m)     Head Circumference --      Peak Flow --      Pain Score 03/03/24 1350 8     Pain Loc --      Pain Education --      Exclude from Growth Chart --     Most recent vital signs: Vitals:   03/03/24 1348 03/03/24 1729  BP: (!) 165/93 (!) 178/99  Pulse: (!) 118 96  Resp: 20 20  Temp: 99.2 F (37.3 C) 98.5 F (36.9 C)  SpO2: 95% 94%    Physical Exam Constitutional:      Appearance: She is well-developed.  HENT:     Head: Atraumatic.  Eyes:     Conjunctiva/sclera: Conjunctivae normal.  Cardiovascular:     Rate and  Rhythm: Regular rhythm.  Pulmonary:     Effort: No respiratory distress.  Abdominal:     General: There is no distension.  Musculoskeletal:        General: Normal range of motion.     Cervical back: Normal range of motion.     Comments: Tenderness to palpation to the left calf and with minimal touch to the left leg.  ++2 and equal DP pulses that are equal bilaterally.  Good capillary refill.  No crepitance.  No overlying erythema warmth or induration.  No significant midline thoracic or lumbar tenderness to palpation.  Tenderness to palpation to bilateral piriformis area.  Skin:    General: Skin is warm.  Neurological:     Mental Status: She is alert. Mental status is at baseline.     IMPRESSION / MDM / ASSESSMENT AND PLAN / ED COURSE  I reviewed the triage vital signs and  the nursing notes.  Differential diagnosis including radiculopathy, DVT, sciatica  EKG LABS (all labs ordered are listed, but only abnormal results are displayed) Labs interpreted as -    Labs Reviewed  BASIC METABOLIC PANEL WITH GFR - Abnormal; Notable for the following components:      Result Value   Sodium 132 (*)    Chloride 92 (*)    Glucose, Bld 418 (*)    All other components within normal limits  CBG MONITORING, ED - Abnormal; Notable for the following components:   Glucose-Capillary 306 (*)    All other components within normal limits  CBC WITH DIFFERENTIAL/PLATELET  D-DIMER, QUANTITATIVE     MDM  Given pain medication while in the emergency department.  Hyperglycemia with a glucose in the 400s but no findings of DKA has a normal anion gap and a CO2 of 28.  No significant leukocytosis or anemia.  Low risk Wells criteria and D-dimer is negative have a low suspicion for DVT to the left lower extremity.  Patient was given subcu insulin  and pain medication  On reevaluation patient had a glucose in the 300s.  Feeling better.  Continues to have good peripheral pulses and no findings concerning for  a cellulitis or necrotizing soft tissue infection.  Intact and symmetric pulses, have low suspicion for peripheral arterial disease or dissection.  No symptoms of cauda equina or epidural compression syndrome and do not feel that she meets criteria for an emergent MRI.  Discussed multimodal pain management and close follow-up as an outpatient with her primary care and pain specialist.  Discussed return for any ongoing or worsening symptoms.  No questions or concerns at time of discharge.     PROCEDURES:  Critical Care performed: No  Procedures  Patient's presentation is most consistent with acute presentation with potential threat to life or bodily function.   MEDICATIONS ORDERED IN ED: Medications  lidocaine  (LIDODERM ) 5 % 1 patch (1 patch Transdermal Patch Applied 03/03/24 1551)  oxyCODONE -acetaminophen  (PERCOCET/ROXICET) 5-325 MG per tablet 1 tablet (1 tablet Oral Given 03/03/24 1551)  insulin  aspart (novoLOG ) injection 6 Units (6 Units Subcutaneous Given 03/03/24 1551)    FINAL CLINICAL IMPRESSION(S) / ED DIAGNOSES   Final diagnoses:  Left leg pain     Rx / DC Orders   ED Discharge Orders     None        Note:  This document was prepared using Dragon voice recognition software and may include unintentional dictation errors.   Suzanne Kirsch, MD 03/03/24 2042

## 2024-03-03 NOTE — Discharge Instructions (Addendum)
 You are seen in the emergency department for left leg pain.  You had a screening test that did not show any signs of blood clots.  We discussed over-the-counter Lidoderm  patches which you can place to your lower back, keep on for 12 hours and then removed.  Follow-up closely with your pain specialist and primary care physician.  Discussed whether you may need another MRI of your lower back.  Discussed possible physical therapy or occupational therapy.  No heavy lifting more than 10 pounds, no twisting or bending over.  Return to the emergency department if you develop any weakness, numbness, urinary or bowel incontinence or any other new concerning symptoms.  Pain control:  Ibuprofen (motrin/aleve karolyn) - You can take 3 tablets (600 mg) every 6 hours as needed for pain/fever.  Acetaminophen  (tylenol ) - You can take 2 extra strength tablets (1000 mg) every 6 hours as needed for pain/fever.  You can alternate these medications or take them together.  Make sure you eat food/drink water when taking these medications.

## 2024-03-03 NOTE — ED Triage Notes (Signed)
 Pt to ED for pain to both legs, knees, hips since. Pt has been going to pain management clinic. States pain is interfering with work and ADLs.  States she has been crying 2 or 3 times a day. Has been taking tizanidine and is running out.  Basic labs and SST sent to lab.

## 2024-03-04 ENCOUNTER — Telehealth: Payer: Self-pay

## 2024-03-04 NOTE — Telephone Encounter (Signed)
 She went to the ER this weekend and they gave her one percocet and it helped but now the pain is back. Her job is telling her she needs to get something done so she can do her job. She wants to have fmla papers filled out so she can get medical leave. She states if something is not done for her pain she will not be able to work anymore. I am going to schedule her an eval to discuss with Dr. Naveira

## 2024-03-05 NOTE — Progress Notes (Unsigned)
 PROVIDER NOTE: Interpretation of information contained herein should be left to medically-trained personnel. Specific patient instructions are provided elsewhere under Patient Instructions section of medical record. This document was created in part using AI and STT-dictation technology, any transcriptional errors that may result from this process are unintentional.  Patient: Kristy Cunningham  Service: E/M   PCP: Era Raisin, NP  DOB: 03-Sep-1970  DOS: 03/06/2024  Provider: Eric DELENA Como, MD  MRN: [id number]  Delivery: Face-to-face  Specialty: Interventional Pain Management  Type: Established Patient  Setting: Ambulatory outpatient facility  Specialty designation: 09  Referring Prov.: Era Raisin, NP  Location: Outpatient office facility       History of present illness (HPI) Kristy Cunningham, a 53 y.o. year old female, is here today because of her Abnormal MRI, lumbar spine [R93.7]. Kristy Cunningham primary complain today is No chief complaint on file.  Pertinent problems: Kristy Cunningham has Spondylolisthesis, lumbar region; Lumbar radiculopathy (Left); Lumbar facet joint arthropathy (Bilateral) (L3-4, L4-5); Chronic LLQ pain; Lumbar spondylosis; Chronic sacroiliac joint pain (Left); Swelling of lower leg; Chronic pain syndrome; Abnormal MRI, lumbar spine (11/30/2022) ([facility]); Chronic feet pain (Bilateral); Chronic knee pain (3ry area of Pain) (Left); Spasm of muscle of lower back; Muscle spasms of lower extremity (Left); Fasciculations of muscle (hamstring) (Left); Allodynia (foot) (Left); Chronic feet swelling (Bilateral); Lumbar intervertebral disc protrusion (Right: L2-3) (Left: L3-4); Displacement of lumbar intervertebral disc; Chronic lower extremity pain (1ry area of Pain) (Left); Chronic low back pain (Midline) (4th area of Pain) w/ sciatica (Left); Edema of lower extremities (Bilateral); Chronic painful diabetic neuropathy (HCC); Diabetic peripheral neuropathy (HCC);  Trochanteric bursitis of hips (Bilateral); Burning pain of the feet (Bilateral); Chronic low back pain (Midline) w/o sciatica; Chronic low back pain (Bilateral) w/o sciatica; Lumbar facet joint pain; Diabetic sensorimotor polyneuropathy (HCC); Lumbar facet arthropathy; Spondylosis without myelopathy or radiculopathy, lumbar region; Recurrent left knee instability; Primary osteoarthritis of left knee; and Chronic hip pain (Bilateral) (L>R) on their pertinent problem list.  Pain Assessment: Severity of   is reported as a  /10. Location:    / . Onset:  . Quality:  . Timing:  . Modifying factor(s):  SABRA Vitals:  vitals were not taken for this visit.  BMI: Estimated body mass index is 32.61 kg/m as calculated from the following:   Height as of 03/03/24: 5' (1.524 m).   Weight as of 03/03/24: 167 lb (75.8 kg).  Last encounter: 02/21/2024. Last procedure: 01/18/2024.  Reason for encounter: evaluation of worsening, or previously known (established) problem.  The patient went to the ED on 03/03/2024 with left-sided hip and lower extremity pain  Discussed the use of AI scribe software for clinical note transcription with the patient, who gave verbal consent to proceed.  History of Present Illness           Pharmacotherapy Assessment   Opioid Analgesics: No chronic opioid analgesics therapy prescribed by our practice. None MME/day: 0 mg/day   Monitoring: [place] PMP: PDMP not reviewed this encounter.       Pharmacotherapy: No side-effects or adverse reactions reported. Compliance: No problems identified. Effectiveness: Clinically acceptable.  No notes on file  UDS:  Summary  Date Value Ref Range Status  02/08/2023 Note  Final    Comment:    ==================================================================== Compliance Drug Analysis, Ur ==================================================================== Specimen Alert Not Detected result may be consistent with the time of last use noted for  this medication. AS NEEDED (Oxycodone , Tramadol) ==================================================================== Test  Result       Flag       Units  Drug Present and Declared for Prescription Verification   Desmethylcyclobenzaprine       PRESENT      EXPECTED    Desmethylcyclobenzaprine is an expected metabolite of    cyclobenzaprine.    Sertraline                     PRESENT      EXPECTED   Desmethylsertraline            PRESENT      EXPECTED    Desmethylsertraline is an expected metabolite of sertraline.  Drug Absent but Declared for Prescription Verification   Oxycodone                       Not Detected UNEXPECTED ng/mg creat   Tramadol                       Not Detected UNEXPECTED ng/mg creat   Gabapentin                     Not Detected UNEXPECTED   Tizanidine                     Not Detected UNEXPECTED    Tizanidine, as indicated in the declared medication list, is not    always detected even when used as directed.    Acetaminophen                   Not Detected UNEXPECTED    Acetaminophen , as indicated in the declared medication list, is not    always detected even when used as directed.    Diclofenac                     Not Detected UNEXPECTED    Diclofenac, as indicated in the declared medication list, is not    always detected even when used as directed.    Naproxen                        Not Detected UNEXPECTED ==================================================================== Test                      Result    Flag   Units      Ref Range   Creatinine              43               mg/dL      >=79 ==================================================================== Declared Medications:  The flagging and interpretation on this report are based on the  following declared medications.  Unexpected results may arise from  inaccuracies in the declared medications.   **Note: The testing scope of this panel includes these medications:    Cyclobenzaprine (Flexeril)  Gabapentin (Neurontin)  Naproxen  (Naprosyn )  Oxycodone   Sertraline (Zoloft)  Tramadol (Ultram)   **Note: The testing scope of this panel does not include small to  moderate amounts of these reported medications:   Acetaminophen   Diclofenac (Voltaren)  Tizanidine (Zanaflex)   **Note: The testing scope of this panel does not include the  following reported medications:   Azithromycin  (Zithromax )  Bisacodyl  Cephalexin  Clotrimazole (Lotrimin)  Dicyclomine (Bentyl)  Docusate (Colace)  Dulaglutide (Trulicity)  Empagliflozin  (Jardiance )  Fluconazole (Diflucan)  Fluticasone (Flonase)  Hydrochlorothiazide (Hyzaar)  Insulin  (Lantus)  Losartan  (Cozaar )  Losartan  (Hyzaar)  Meloxicam (Mobic)  Metformin (Janumet)  Omeprazole (Prilosec)  Ondansetron  (Zofran )  Polyethylene Glycol  Prednisone  (Deltasone )  Sitagliptin (Janumet)  Sulfamethoxazole (Bactrim)  Supplement  Topical  Trimethoprim (Bactrim) ==================================================================== For clinical consultation, please call [phone]. ====================================================================     No results found for: CBDTHCR No results found for: D8THCCBX No results found for: D9THCCBX  ROS  Constitutional: Denies any fever or chills Gastrointestinal: No reported hemesis, hematochezia, vomiting, or acute GI distress Musculoskeletal: Denies any acute onset joint swelling, redness, loss of ROM, or weakness Neurological: No reported episodes of acute onset apraxia, aphasia, dysarthria, agnosia, amnesia, paralysis, loss of coordination, or loss of consciousness  Medication Review  DULoxetine, Glucose Blood, Insulin  Pen Needle, aspirin EC, buPROPion, cetirizine, dapagliflozin  propanediol, docusate sodium, fluticasone, furosemide , glucose blood, insulin  glargine, metoprolol  succinate, omeprazole, sacubitril -valsartan , spironolactone , tiZANidine, and  tirzepatide  History Review  Allergy: Kristy Cunningham is allergic to keflex [cephalexin] and jardiance  [empagliflozin ]. Drug: Kristy Cunningham  reports no history of drug use. Alcohol:  reports no history of alcohol use. Tobacco:  reports that she has never smoked. She has never used smokeless tobacco. Social: Kristy Cunningham  reports that she has never smoked. She has never used smokeless tobacco. She reports that she does not drink alcohol and does not use drugs. Medical:  has a past medical history of CHF (congestive heart failure) (HCC), Depression, Diabetes mellitus without complication (HCC), Hypertension, Low back strain (10/26/2022), and Pedal edema. Surgical: Kristy Cunningham  has a past surgical history that includes RIGHT/LEFT HEART CATH AND CORONARY ANGIOGRAPHY (Bilateral, 08/23/2023). Family: family history includes Breast cancer in her paternal grandmother.  Laboratory Chemistry Profile   Renal Lab Results  Component Value Date   BUN 18 03/03/2024   CREATININE 0.65 03/03/2024   BCR 31 (H) 08/02/2023   GFRNONAA >60 03/03/2024    Hepatic Lab Results  Component Value Date   AST 19 09/10/2023   ALT 23 09/10/2023   ALBUMIN 3.9 09/10/2023   ALKPHOS 96 09/10/2023   LIPASE 31 09/10/2023    Electrolytes Lab Results  Component Value Date   NA 132 (L) 03/03/2024   K 4.0 03/03/2024   CL 92 (L) 03/03/2024   CALCIUM 9.9 03/03/2024   MG 2.1 02/08/2023    Bone Lab Results  Component Value Date   25OHVITD1 32 02/08/2023   25OHVITD2 <1.0 02/08/2023   25OHVITD3 32 02/08/2023    Inflammation (CRP: Acute Phase) (ESR: Chronic Phase) Lab Results  Component Value Date   CRP 2 02/08/2023   ESRSEDRATE 68 (H) 02/08/2023   LATICACIDVEN 1.8 02/27/2021         Note: Above Lab results reviewed.  Recent Imaging Review  DG HIPS BILAT W OR W/O PELVIS MIN 5 VIEWS EXAM: 5 or more VIEW(S) XRAY OF THE BILATERAL HIP 02/21/2024 02:15:38 PM  COMPARISON: None available.  CLINICAL  HISTORY: Chronic bilateral hip pain (M25.551, M25.552, G89.29)  FINDINGS:  BONES AND JOINTS: No acute fracture. No malalignment.  SOFT TISSUES: The soft tissues are unremarkable.  IMPRESSION: 1. No acute osseous abnormality to explain chronic bilateral hip pain. 2. No significant degenerative changes of the hips bilaterally.  Electronically signed by: Waddell Calk MD 03/01/2024 02:13 PM EST RP Workstation: [id number] Note: Reviewed        Physical Exam  Vitals: LMP 05/11/2016 (Approximate)  BMI: Estimated body mass index is 32.61 kg/m as calculated from the following:   Height as of 03/03/24: 5' (1.524 m).   Weight  as of 03/03/24: 167 lb (75.8 kg). Ideal: Ideal body weight: 45.5 kg (100 lb 4.9 oz) Adjusted ideal body weight: 57.6 kg (126 lb 15.8 oz) General appearance: Well nourished, well developed, and well hydrated. In no apparent acute distress Mental status: Alert, oriented x 3 (person, place, & time)       Respiratory: No evidence of acute respiratory distress Eyes: PERLA   Assessment   Diagnosis Status  1. Abnormal MRI, lumbar spine (11/30/2022) ([facility])   2. Allodynia (foot) (Left)   3. Chronic hip pain (Bilateral) (L>R)   4. Chronic knee pain (3ry area of Pain) (Left)   5. Chronic low back pain (Midline) (4th area of Pain) w/ sciatica (Left)   6. Chronic lower extremity pain (1ry area of Pain) (Left)   7. Lumbar facet joint arthropathy (Bilateral) (L3-4, L4-5)   8. Lumbar radiculopathy (Left)   9. Muscle spasms of lower extremity (Left)   10. Spasm of muscle of lower back   11. Spondylolisthesis, lumbar region   12. Diabetic sensorimotor polyneuropathy (HCC)   13. Diabetic peripheral neuropathy (HCC)   14. Burning pain of the feet (Bilateral)   15. Chronic feet pain (Bilateral)   16. Chronic feet swelling (Bilateral)    Controlled Controlled Controlled   Updated Problems: Problem  Spondylolisthesis, Lumbar Region  Lumbar facet joint arthropathy  (Bilateral) (L3-4, L4-5)   (11/30/2022) LUMBAR MRI FINDINGS: ([facility]) DISC LEVELS: L3-4: Facet arthropathy.  L4-5: Facet arthropathy.      Plan of Care  Problem-specific:  Assessment and Plan            Kristy Cunningham has a current medication list which includes the following long-term medication(s): bupropion, cetirizine, duloxetine, fluticasone, furosemide , lantus solostar, metoprolol  succinate, omeprazole, and spironolactone .  Pharmacotherapy (Medications Ordered): No orders of the defined types were placed in this encounter.  Orders:  No orders of the defined types were placed in this encounter.    Interventional Therapies  Risk Factors  Considerations  Medical Comorbidities:  Uncontrolled T2IDDM  CHF  Hx. Fluid Overload     Planned  Pending:   Diagnostic bilateral lumbar facet MBB #2  Therapeutic bilateral lower extremity Qutenza treatment #1    Under consideration:   Therapeutic bilateral lower extremity Qutenza treatment #1  Diagnostic bilateral lumbar facet MBB #2 (w/o steroids) with possible follow-up RFA   Completed:   Diagnostic bilateral lumbar facet MBB x1 (w/o steroids) (01/18/2024) (100/100/100/0) Diagnostic/therapeutic left-sided L3-4 LESI x3 (11/28/2023) (100/100/50/50)    Therapeutic  Palliative (PRN) options:   None established   Completed by other providers:   (01/18/2023) diagnostic left sacroiliac joint injection by [facility] (12/14/2022) lower extremity EMG by [facility]     No follow-ups on file.    Recent Visits Date Type Provider Dept  02/21/24 Office Visit Tanya Glisson, MD [facility]  02/07/24 Office Visit Tanya Glisson, MD [facility]  01/18/24 Procedure visit Tanya Glisson, MD [facility]  12/27/23 Office Visit Tanya Glisson, MD [facility]  Showing recent visits within past 90 days and meeting all other requirements Future Appointments Date Type  Provider Dept  03/06/24 Appointment Tanya Glisson, MD [facility]  Showing future appointments within next 90 days and meeting all other requirements  I discussed the assessment and treatment plan with the patient. The patient was provided an opportunity to ask questions and all were answered. The patient agreed with the plan and demonstrated an understanding of the instructions.  Patient advised to call  back or seek an in-person evaluation if the symptoms or condition worsens.  Duration of encounter: *** minutes.  Total time on encounter, as per AMA guidelines included both the face-to-face and non-face-to-face time personally spent by the physician and/or other qualified health care professional(s) on the day of the encounter (includes time in activities that require the physician or other qualified health care professional and does not include time in activities normally performed by clinical staff). Physician's time may include the following activities when performed: Preparing to see the patient (e.g., pre-charting review of records, searching for previously ordered imaging, lab work, and nerve conduction tests) Review of prior analgesic pharmacotherapies. Reviewing PMP Interpreting ordered tests (e.g., lab work, imaging, nerve conduction tests) Performing post-procedure evaluations, including interpretation of diagnostic procedures Obtaining and/or reviewing separately obtained history Performing a medically appropriate examination and/or evaluation Counseling and educating the patient/family/caregiver Ordering medications, tests, or procedures Referring and communicating with other health care professionals (when not separately reported) Documenting clinical information in the electronic or other health record Independently interpreting results (not separately reported) and communicating results to the patient/ family/caregiver Care coordination (not separately  reported)  Note by: Eric DELENA Como, MD (TTS and AI technology used. I apologize for any typographical errors that were not detected and corrected.) Date: 03/06/2024; Time: 6:36 PM

## 2024-03-06 ENCOUNTER — Encounter: Payer: Self-pay | Admitting: Pain Medicine

## 2024-03-06 ENCOUNTER — Ambulatory Visit: Attending: Pain Medicine | Admitting: Pain Medicine

## 2024-03-06 VITALS — BP 157/106 | HR 116 | Temp 98.4°F | Resp 18 | Ht 60.0 in | Wt 167.0 lb

## 2024-03-06 DIAGNOSIS — M4316 Spondylolisthesis, lumbar region: Secondary | ICD-10-CM | POA: Insufficient documentation

## 2024-03-06 DIAGNOSIS — R208 Other disturbances of skin sensation: Secondary | ICD-10-CM

## 2024-03-06 DIAGNOSIS — M79605 Pain in left leg: Secondary | ICD-10-CM | POA: Insufficient documentation

## 2024-03-06 DIAGNOSIS — G8929 Other chronic pain: Secondary | ICD-10-CM

## 2024-03-06 DIAGNOSIS — M25562 Pain in left knee: Secondary | ICD-10-CM | POA: Diagnosis present

## 2024-03-06 DIAGNOSIS — M79672 Pain in left foot: Secondary | ICD-10-CM | POA: Insufficient documentation

## 2024-03-06 DIAGNOSIS — E1142 Type 2 diabetes mellitus with diabetic polyneuropathy: Secondary | ICD-10-CM

## 2024-03-06 DIAGNOSIS — M5416 Radiculopathy, lumbar region: Secondary | ICD-10-CM | POA: Insufficient documentation

## 2024-03-06 DIAGNOSIS — R937 Abnormal findings on diagnostic imaging of other parts of musculoskeletal system: Secondary | ICD-10-CM

## 2024-03-06 DIAGNOSIS — M62830 Muscle spasm of back: Secondary | ICD-10-CM

## 2024-03-06 DIAGNOSIS — M79671 Pain in right foot: Secondary | ICD-10-CM | POA: Diagnosis present

## 2024-03-06 DIAGNOSIS — R52 Pain, unspecified: Secondary | ICD-10-CM | POA: Insufficient documentation

## 2024-03-06 DIAGNOSIS — M7989 Other specified soft tissue disorders: Secondary | ICD-10-CM | POA: Insufficient documentation

## 2024-03-06 DIAGNOSIS — M25551 Pain in right hip: Secondary | ICD-10-CM | POA: Diagnosis present

## 2024-03-06 DIAGNOSIS — M25552 Pain in left hip: Secondary | ICD-10-CM | POA: Diagnosis present

## 2024-03-06 DIAGNOSIS — M47816 Spondylosis without myelopathy or radiculopathy, lumbar region: Secondary | ICD-10-CM | POA: Insufficient documentation

## 2024-03-06 DIAGNOSIS — M62838 Other muscle spasm: Secondary | ICD-10-CM | POA: Diagnosis present

## 2024-03-06 DIAGNOSIS — M4726 Other spondylosis with radiculopathy, lumbar region: Secondary | ICD-10-CM

## 2024-03-06 DIAGNOSIS — M5442 Lumbago with sciatica, left side: Secondary | ICD-10-CM | POA: Diagnosis present

## 2024-03-06 MED ORDER — OXYCODONE HCL 5 MG PO TABS: 5.0000 mg | ORAL_TABLET | Freq: Four times a day (QID) | ORAL | 0 refills | Status: AC | PRN

## 2024-03-06 NOTE — Patient Instructions (Signed)
 ______________________________________________________________________    Opioid Pain Medication Update  To: All patients taking opioid pain medications. (I.e.: hydrocodone , hydromorphone, oxycodone , oxymorphone, morphine, codeine, methadone, tapentadol, tramadol, buprenorphine, fentanyl , etc.)  Re: Updated review of side effects and adverse reactions of opioid analgesics, as well as new information about long term effects of this class of medications.  Direct risks of long-term opioid therapy are not limited to opioid addiction and overdose. Potential medical risks include serious fractures, breathing problems during sleep, hyperalgesia, immunosuppression, chronic constipation, bowel obstruction, myocardial infarction, and tooth decay secondary to xerostomia.  Unpredictable adverse effects that can occur even if you take your medication correctly: Cognitive impairment, respiratory depression, and death. Most people think that if they take their medication correctly, and as instructed, that they will be safe. Nothing could be farther from the truth. In reality, a significant amount of recorded deaths associated with the use of opioids has occurred in individuals that had taken the medication for a long time, and were taking their medication correctly. The following are examples of how this can happen: Patient taking his/her medication for a long time, as instructed, without any side effects, is given a certain antibiotic or another unrelated medication, which in turn triggers a Drug-to-drug interaction leading to disorientation, cognitive impairment, impaired reflexes, respiratory depression or an untoward event leading to serious bodily harm or injury, including death.  Patient taking his/her medication for a long time, as instructed, without any side effects, develops an acute impairment of liver and/or kidney function. This will lead to a rapid inability of the body to breakdown and eliminate  their pain medication, which will result in effects similar to an overdose, but with the same medicine and dose that they had always taken. This again may lead to disorientation, cognitive impairment, impaired reflexes, respiratory depression or an untoward event leading to serious bodily harm or injury, including death.  A similar problem will occur with patients as they grow older and their liver and kidney function begins to decrease as part of the aging process.  Background information: Historically, the original case for using long-term opioid therapy to treat chronic noncancer pain was based on safety assumptions that subsequent experience has called into question. In 1996, the American Pain Society and the American Academy of Pain Medicine issued a consensus statement supporting long-term opioid therapy. This statement acknowledged the dangers of opioid prescribing but concluded that the risk for addiction was low; respiratory depression induced by opioids was short-lived, occurred mainly in opioid-naive patients, and was antagonized by pain; tolerance was not a common problem; and efforts to control diversion should not constrain opioid prescribing. This has now proven to be wrong. Experience regarding the risks for opioid addiction, misuse, and overdose in community practice has failed to support these assumptions.  According to the Centers for Disease Control and Prevention, fatal overdoses involving opioid analgesics have increased sharply over the past decade. Currently, more than 96,700 people die from drug overdoses every year. Opioids are a factor in 7 out of every 10 overdose deaths. Deaths from drug overdose have surpassed motor vehicle accidents as the leading cause of death for individuals between the ages of 51 and 54.  Clinical data suggest that neuroendocrine dysfunction may be very common in both men and women, potentially causing hypogonadism, erectile dysfunction, infertility,  decreased libido, osteoporosis, and depression. Recent studies linked higher opioid dose to increased opioid-related mortality. Controlled observational studies reported that long-term opioid therapy may be associated with increased risk for cardiovascular events. Subsequent  meta-analysis concluded that the safety of long-term opioid therapy in elderly patients has not been proven.   Side Effects and adverse reactions: Common side effects: Drowsiness (sedation). Dizziness. Nausea and vomiting. Constipation. Physical dependence -- Dependence often manifests with withdrawal symptoms when opioids are discontinued or decreased. Tolerance -- As you take repeated doses of opioids, you require increased medication to experience the same effect of pain relief. Respiratory depression -- This can occur in healthy people, especially with higher doses. However, people with COPD, asthma or other lung conditions may be even more susceptible to fatal respiratory impairment.  Uncommon side effects: An increased sensitivity to feeling pain and extreme response to pain (hyperalgesia). Chronic use of opioids can lead to this. Delayed gastric emptying (the process by which the contents of your stomach are moved into your small intestine). Muscle rigidity. Immune system and hormonal dysfunction. Quick, involuntary muscle jerks (myoclonus). Arrhythmia. Itchy skin (pruritus). Dry mouth (xerostomia).  Long-term side effects: Chronic constipation. Sleep-disordered breathing (SDB). Increased risk of bone fractures. Hypothalamic-pituitary-adrenal dysregulation. Increased risk of overdose.  RISKS: Respiratory depression and death: Opioids increase the risk of respiratory depression and death.  Drug-to-drug interactions: Opioids are relatively contraindicated in combination with benzodiazepines, sleep inducers, and other central nervous system depressants. Other classes of medications (i.e.: certain antibiotics  and even over-the-counter medications) may also trigger or induce respiratory depression in some patients.  Medical conditions: Patients with pre-existing respiratory problems are at higher risk of respiratory failure and/or depression when in combination with opioid analgesics. Opioids are relatively contraindicated in some medical conditions such as central sleep apnea.   Fractures and Falls:  Opioids increase the risk and incidence of falls. This is of particular importance in elderly patients.  Endocrine System:  Long-term administration is associated with endocrine abnormalities (endocrinopathies). (Also known as Opioid-induced Endocrinopathy) Influences on both the hypothalamic-pituitary-adrenal axis?and the hypothalamic-pituitary-gonadal axis have been demonstrated with consequent hypogonadism and adrenal insufficiency in both sexes. Hypogonadism and decreased levels of dehydroepiandrosterone sulfate have been reported in men and women. Endocrine effects include: Amenorrhoea in women (abnormal absence of menstruation) Reduced libido in both sexes Decreased sexual function Erectile dysfunction in men Hypogonadisms (decreased testicular function with shrinkage of testicles) Infertility Depression and fatigue Loss of muscle mass Anxiety Depression Immune suppression Hyperalgesia Weight gain Anemia Osteoporosis Patients (particularly women of childbearing age) should avoid opioids. There is insufficient evidence to recommend routine monitoring of asymptomatic patients taking opioids in the long-term for hormonal deficiencies.  Immune System: Human studies have demonstrated that opioids have an immunomodulating effect. These effects are mediated via opioid receptors both on immune effector cells and in the central nervous system. Opioids have been demonstrated to have adverse effects on antimicrobial response and anti-tumour surveillance. Buprenorphine has been demonstrated to have  no impact on immune function.  Opioid Induced Hyperalgesia: Human studies have demonstrated that prolonged use of opioids can lead to a state of abnormal pain sensitivity, sometimes called opioid induced hyperalgesia (OIH). Opioid induced hyperalgesia is not usually seen in the absence of tolerance to opioid analgesia. Clinically, hyperalgesia may be diagnosed if the patient on long-term opioid therapy presents with increased pain. This might be qualitatively and anatomically distinct from pain related to disease progression or to breakthrough pain resulting from development of opioid tolerance. Pain associated with hyperalgesia tends to be more diffuse than the pre-existing pain and less defined in quality. Management of opioid induced hyperalgesia requires opioid dose reduction.  Cancer: Chronic opioid therapy has been associated with an increased risk  of cancer among noncancer patients with chronic pain. This association was more evident in chronic strong opioid users. Chronic opioid consumption causes significant pathological changes in the small intestine and colon. Epidemiological studies have found that there is a link between opium dependence and initiation of gastrointestinal cancers. Cancer is the second leading cause of death after cardiovascular disease. Chronic use of opioids can cause multiple conditions such as GERD, immunosuppression and renal damage as well as carcinogenic effects, which are associated with the incidence of cancers.   Mortality: Long-term opioid use has been associated with increased mortality among patients with chronic non-cancer pain (CNCP).  Prescription of long-acting opioids for chronic noncancer pain was associated with a significantly increased risk of all-cause mortality, including deaths from causes other than overdose.  Reference: Von Korff M, Kolodny A, Deyo RA, Chou R. Long-term opioid therapy reconsidered. Ann Intern Med. 2011 Sep 6;155(5):325-8. doi:  10.7326/[id number]. PMID: 78106373; PMCID: EFR6719914. Kit JINNY Laurence CINDERELLA Pearley JINNY, Hayward RA, Dunn KM, Jordan KP. Risk of adverse events in patients prescribed long-term opioids: A cohort study in the [place] Clinical Practice Research Datal[employer]. Eur J Pain. 2019 May;23(5):908-922. doi: 10.1002/ejp.1357. Epub 2019 Jan 31. PMID: 69379883. Colameco S, Coren JS, Ciervo CA. Continuous opioid treatment for chronic noncancer pain: a time for moderation in prescribing. Postgrad Med. 2009 Jul;121(4):61-6. doi: 10.3810/pgm.2009.07.2032. PMID: 80358728. Gigi JONELLE Shlomo MILUS Levern IVER Conny RN, Mason SD, Blazina I, Lonell DASEN, Bougatsos C, Deyo RA. The effectiveness and risks of long-term opioid therapy for chronic pain: a systematic review for a [employer] of [employer] Pathways to [employer]. Ann Intern Med. 2015 Feb 17;162(4):276-86. doi: 10.7326/M14-2559. PMID: 74418742. Rory CHRISTELLA Laurence [facility], [place] DM. NCHS Data Brief No. 22. [place]: Centers for Disease Control and Prevention; 2009. Sep, Increase in Fatal Poisonings Involving Opioid Analgesics in the [place] , 1999-2006. Song IA, Choi HR, Oh TK. Long-term opioid use and mortality in patients with chronic non-cancer pain: Ten-year follow-up study in [place] from 2010 through 2019. EClinicalMedicine. 2022 Jul 18;51:101558. doi: 10.1016/j.eclinm.2022.898441. PMID: 64124182; PMCID: EFR0695089. Huser, W., Schubert, T., Vogelmann, T. et al. All-cause mortality in patients with long-term opioid therapy compared with non-opioid analgesics for chronic non-cancer pain: a database study. BMC Med 18, 162 (2020). http://lester.info/ Rashidian H, Zendehdel K, Kamangar F, Malekzadeh R, Haghdoost AA. An Ecological Study of the Association between Opiate Use and Incidence of Cancers. Addict Health. 2016 Fall;8(4):252-260. PMID: 71180443; PMCID: EFR4445194.  Our Goal: Our goal is to control your pain with means other  than the use of opioid pain medications.  Our Recommendation: Talk to your physician about coming off of these medications. We can assist you with the tapering down and stopping these medicines. Based on the new information, even if you cannot completely stop the medication, a decrease in the dose may be associated with a lesser risk. Ask for other means of controlling the pain. Decrease or eliminate those factors that significantly contribute to your pain such as smoking, obesity, and a diet heavily tilted towards inflammatory nutrients.  Last Updated: 10/03/2022   ______________________________________________________________________       ______________________________________________________________________    Medication Rules  Purpose: To inform patients, and their family members, of our medication rules and regulations.  Applies to: All patients receiving prescriptions from our practice (written or electronic).  Pharmacy of record: This is the pharmacy where your electronic prescriptions will be sent. Make sure we have the correct one.  Electronic prescriptions: In compliance with the [place]  Strengthen Opioid Misuse Prevention (  STOP) Act of 2017 (Session Law 2017-74/H243), effective March 28, 2018, all controlled substances must be electronically prescribed. Written prescriptions, faxing, or calling prescriptions to a pharmacy will no longer be done.  Prescription refills: These will be provided only during in-person appointments. No medications will be renewed without a face-to-face evaluation with your provider. Applies to all prescriptions.  NOTE: The following applies primarily to controlled substances (Opioid* Pain Medications).   Type of encounter (visit): For patients receiving controlled substances, face-to-face visits are required. (Not an option and not up to the patient.)  Patient's Responsibilities: Pain Pills: Bring all pain pills to every appointment  (except for procedure appointments). Pill counts are required.  Pill Bottles: Bring pills in original pharmacy bottle. Bring bottle, even if empty. Always bring the bottle of the most recent fill.  Medication refills: You are responsible for knowing and keeping track of what medications you are taking and when is it that you will need a refill. The day before your appointment: write a list of all prescriptions that need to be refilled. The day of the appointment: give the list to the admitting nurse. Prescriptions will be written only during appointments. No prescriptions will be written on procedure days. If you forget a medication: it will not be Called in, Faxed, or electronically sent. You will need to get another appointment to get these prescribed. No early refills. Do not call asking to have your prescription filled early. Partial  or short prescriptions: Occasionally your pharmacy may not have enough pills to fill your prescription.  NEVER ACCEPT a partial fill or a prescription that is short of the total amount of pills that you were prescribed.  With controlled substances the law allows 72 hours for the pharmacy to complete the prescription.  If the prescription is not completed within 72 hours, the pharmacist will require a new prescription to be written. This means that you will be short on your medicine and we WILL NOT send another prescription to complete your original prescription.  Instead, request the pharmacy to send a carrier to a nearby branch to get enough medication to provide you with your full prescription. Prescription Accuracy: You are responsible for carefully inspecting your prescriptions before leaving our office. Have the discharge nurse carefully go over each prescription with you, before taking them home. Make sure that your name is accurately spelled, that your address is correct. Check the name and dose of your medication to make sure it is accurate. Check the number  of pills, and the written instructions to make sure they are clear and accurate. Make sure that you are given enough medication to last until your next medication refill appointment. Taking Medication: Take medication as prescribed. When it comes to controlled substances, taking less pills or less frequently than prescribed is permitted and encouraged. Never take more pills than instructed. Never take the medication more frequently than prescribed.  Inform other Doctors: Always inform, all of your healthcare providers, of all the medications you take. Pain Medication from other Providers: You are not allowed to accept any additional pain medication from any other Doctor or Healthcare provider. There are two exceptions to this rule. (see below) In the event that you require additional pain medication, you are responsible for notifying us , as stated below. Cough Medicine: Often these contain an opioid, such as codeine or hydrocodone . Never accept or take cough medicine containing these opioids if you are already taking an opioid* medication. The combination may cause respiratory failure and death.  Medication Agreement: You are responsible for carefully reading and following our Medication Agreement. This must be signed before receiving any prescriptions from our practice. Safely store a copy of your signed Agreement. Violations to the Agreement will result in no further prescriptions. (Additional copies of our Medication Agreement are available upon request.) Laws, Rules, & Regulations: All patients are expected to follow all [address] and [employer], Rules,  [place]. Ignorance of the Laws does not constitute a valid excuse.  Illegal drugs and Controlled Substances: The use of illegal substances (including, but not limited to marijuana and its derivatives) and/or the illegal use of any controlled substances is strictly prohibited. Violation of this rule may result in the immediate and permanent  discontinuation of any and all prescriptions being written by our practice. The use of any illegal substances is prohibited. Adopted CDC guidelines & recommendations: Target dosing levels will be at or below 60 MME/day. Use of benzodiazepines** is not recommended. Urine Drug testing: Patients taking controlled substances will be required to provide a urine sample upon request. Do not void before coming to your medication management appointments. Hold emptying your bladder until you are admitted. The admitting nurse will inform you if a sample is required. Our practice reserves the right to call you at any time to provide a sample. Once receiving the call, you have 24 hours to comply with request. Not providing a sample upon request may result in termination of medication therapy.  Exceptions: There are only two exceptions to the rule of not receiving pain medications from other Healthcare Providers. Exception #1 (Emergencies): In the event of an emergency (i.e.: accident requiring emergency care), you are allowed to receive additional pain medication. However, you are responsible for: As soon as you are able, call our office [phone], at any time of the day or night, and leave a message stating your name, the date and nature of the emergency, and the name and dose of the medication prescribed. In the event that your call is answered by a member of our staff, make sure to document and save the date, time, and the name of the person that took your information.  Exception #2 (Planned Surgery): In the event that you are scheduled by another doctor or dentist to have any type of surgery or procedure, you are allowed (for a period no longer than 30 days), to receive additional pain medication, for the acute post-op pain. However, in this case, you are responsible for picking up a copy of our Post-op Pain Management for Surgeons handout, and giving it to your surgeon or dentist. This document is available at  our office, and does not require an appointment to obtain it. Simply go to our office during business hours (Monday-Thursday from 8:00 AM to 4:00 PM) (Friday 8:00 AM to 12:00 Noon) or if you have a scheduled appointment with us , prior to your surgery, and ask for it by name. In addition, you are responsible for: calling our office [phone], at any time of the day or night, and leaving a message stating your name, name of your surgeon, type of surgery, and date of procedure or surgery. Failure to comply with your responsibilities may result in termination of therapy involving the controlled substances.  Consequences:  Non-compliance with the above rules may result in permanent discontinuation of medication prescription therapy. All patients receiving any type of controlled substance is expected to comply with the above patient responsibilities. Not doing so may result in permanent discontinuation of  medication prescription therapy. Medication Agreement Violation. Following the above rules, including your responsibilities will help you in avoiding a Medication Agreement Violation (Breaking your Pain Medication Contract).  *Opioid medications include: morphine, codeine, oxycodone , oxymorphone, hydrocodone , hydromorphone, meperidine, tramadol, tapentadol, buprenorphine, fentanyl , methadone. **Benzodiazepine medications include: diazepam (Valium), alprazolam (Xanax), clonazepam (Klonopine), lorazepam (Ativan), clorazepate (Tranxene), chlordiazepoxide (Librium), estazolam (Prosom), oxazepam (Serax), temazepam (Restoril), triazolam (Halcion) (Last updated: 01/18/2023) ______________________________________________________________________     ______________________________________________________________________    Medication Recommendations and Reminders  Applies to: All patients receiving prescriptions (written and/or electronic).  Medication Rules & Regulations: You are responsible for reading,  knowing, and following our Medication Rules document. These exist for your safety and that of others. They are not flexible and neither are we. Dismissing or ignoring them is an act of non-compliance that may result in complete and irreversible termination of such medication therapy. For safety reasons, non-compliance will not be tolerated. As with the [place] fundamental legal principle of ignorance of the law is no defense, we will accept no excuses for not having read and knowing the content of documents provided to you by our practice.  Pharmacy of record:  Definition: This is the pharmacy where your electronic prescriptions will be sent.  We do not endorse any particular pharmacy. It is up to you and your insurance to decide what pharmacy to use.  We do not restrict you in your choice of pharmacy. However, once we write for your prescriptions, we will NOT be re-sending more prescriptions to fix restricted supply problems created by your pharmacy, or your insurance.  The pharmacy listed in the electronic medical record should be the one where you want electronic prescriptions to be sent. If you choose to change pharmacy, simply notify our nursing staff. Changes will be made only during your regular appointments and not over the phone.  Recommendations: Keep all of your pain medications in a safe place, under lock and key, even if you live alone. We will NOT replace lost, stolen, or damaged medication. We do not accept Police Reports as proof of medications having been stolen. After you fill your prescription, take 1 week's worth of pills and put them away in a safe place. You should keep a separate, properly labeled bottle for this purpose. The remainder should be kept in the original bottle. Use this as your primary supply, until it runs out. Once it's gone, then you know that you have 1 week's worth of medicine, and it is time to come in for a prescription refill. If you do this correctly, it  is unlikely that you will ever run out of medicine. To make sure that the above recommendation works, it is very important that you make sure your medication refill appointments are scheduled at least 1 week before you run out of medicine. To do this in an effective manner, make sure that you do not leave the office without scheduling your next medication management appointment. Always ask the nursing staff to show you in your prescription , when your medication will be running out. Then arrange for the receptionist to get you a return appointment, at least 7 days before you run out of medicine. Do not wait until you have 1 or 2 pills left, to come in. This is very poor planning and does not take into consideration that we may need to cancel appointments due to bad weather, sickness, or emergencies affecting our staff. DO NOT ACCEPT A Partial Fill: If for any reason your pharmacy does not have  enough pills/tablets to completely fill or refill your prescription, do not allow for a partial fill. The law allows the pharmacy to complete that prescription within 72 hours, without requiring a new prescription. If they do not fill the rest of your prescription within those 72 hours, you will need a separate prescription to fill the remaining amount, which we will NOT provide. If the reason for the partial fill is your insurance, you will need to talk to the pharmacist about payment alternatives for the remaining tablets, but again, DO NOT ACCEPT A PARTIAL FILL, unless you can trust your pharmacist to obtain the remainder of the pills within 72 hours.  Prescription refills and/or changes in medication(s):  Prescription refills, and/or changes in dose or medication, will be conducted only during scheduled medication management appointments. (Applies to both, written and electronic prescriptions.) No refills on procedure days. No medication will be changed or started on procedure days. No changes, adjustments,  and/or refills will be conducted on a procedure day. Doing so will interfere with the diagnostic portion of the procedure. No phone refills. No medications will be called into the pharmacy. No Fax refills. No weekend refills. No Holliday refills. No after hours refills.  Remember:  Business hours are:  Monday to Thursday 8:00 AM to 4:00 PM Provider's Schedule: Eric Como, MD - Appointments are:  Medication management: Monday and Wednesday 8:00 AM to 4:00 PM Procedure day: Tuesday and Thursday 7:30 AM to 4:00 PM Wallie Sherry, MD - Appointments are:  Medication management: Tuesday and Thursday 8:00 AM to 4:00 PM Procedure day: Monday and Wednesday 7:30 AM to 4:00 PM (Last update: 01/18/2022) ______________________________________________________________________

## 2024-03-06 NOTE — Progress Notes (Signed)
 Safety precautions to be maintained throughout the outpatient stay will include: orient to surroundings, keep bed in low position, maintain call bell within reach at all times, provide assistance with transfer out of bed and ambulation.

## 2024-03-12 ENCOUNTER — Telehealth: Payer: Self-pay | Admitting: Pain Medicine

## 2024-03-12 NOTE — Telephone Encounter (Signed)
PA completed via CMM.

## 2024-03-12 NOTE — Telephone Encounter (Signed)
 PT stated that She have paperwork that was dropped off for disability that has not been done. Pt stated that the meds that Dr. Tanya prescribes is not covered under her insurance she is having problems with the pharmacy.

## 2024-03-26 ENCOUNTER — Telehealth: Payer: Self-pay | Admitting: *Deleted

## 2024-03-26 NOTE — Telephone Encounter (Signed)
 Called patient to inform her that we cannot complete form for work restriction determination. Message left.  The form will remain in the nurse's box for now. In the message in informed her that she may come pick it up or she can let us  know to destroy it.

## 2024-04-26 ENCOUNTER — Telehealth: Payer: Self-pay

## 2024-04-26 ENCOUNTER — Telehealth: Payer: Self-pay | Admitting: Pain Medicine

## 2024-04-26 NOTE — Telephone Encounter (Signed)
 Do not see approval

## 2024-04-26 NOTE — Telephone Encounter (Signed)
 Pt stated that papers were faxed from [facility]. PT was checking to see if they were received. Pt stated that she is in so much pain.

## 2024-04-26 NOTE — Telephone Encounter (Signed)
 Returned patient call, she is asking if our office got a fax update from [facility]. I have not seen a fax, so asked patient to have them resend. Patient also states that she has been unable to get her pain medication. It looks as if the Oxycodone  was approved, I was unable to get in contact with [facility], asked that patient contact them again, and let us  know if she has any problems. Will be on the look out for the fax update.   Patient also had an MRI ordered, and I have asked Merlynn to assist with getting that scheduled.

## 2024-05-03 ENCOUNTER — Telehealth: Payer: Self-pay

## 2024-05-03 NOTE — Telephone Encounter (Signed)
 Dr. Tanya ordered  Qutenza on 11/26. She is medicaid. She called and said she has not heard anything from us . This is something the nurses and doctor has to submit to the [facility]. Please contact patient about what she needs to do.

## 2024-05-03 NOTE — Telephone Encounter (Signed)
 Returning patient phone call, I do not see where Qutenza was prescribed, however I do see the order for the appt time.  If patient does have medicaid coverage we will need to print Rx and send over to [facility] in [place], they then will contact patient for copay and then send medication over.  At that time patient will be contacted for appt time.  (This information was given via voicemail) Told patient we would contact her on Monday and let her know where we are with the process.
